# Patient Record
Sex: Female | Born: 1945 | Race: White | Hispanic: No | Smoking: Former smoker
Health system: Southern US, Community
[De-identification: ages and names within clinical notes are randomized; demographics above are authoritative.]

## PROBLEM LIST (undated history)

## (undated) DIAGNOSIS — F3289 Other specified depressive episodes: Secondary | ICD-10-CM

## (undated) DIAGNOSIS — J439 Emphysema, unspecified: Secondary | ICD-10-CM

## (undated) DIAGNOSIS — K589 Irritable bowel syndrome without diarrhea: Secondary | ICD-10-CM

## (undated) DIAGNOSIS — N819 Female genital prolapse, unspecified: Secondary | ICD-10-CM

## (undated) DIAGNOSIS — Z86010 Personal history of colonic polyps: Secondary | ICD-10-CM

## (undated) DIAGNOSIS — I10 Essential (primary) hypertension: Secondary | ICD-10-CM

## (undated) DIAGNOSIS — G4730 Sleep apnea, unspecified: Secondary | ICD-10-CM

## (undated) DIAGNOSIS — C50919 Malignant neoplasm of unspecified site of unspecified female breast: Secondary | ICD-10-CM

## (undated) DIAGNOSIS — G4700 Insomnia, unspecified: Secondary | ICD-10-CM

## (undated) DIAGNOSIS — F419 Anxiety disorder, unspecified: Secondary | ICD-10-CM

## (undated) DIAGNOSIS — Z923 Personal history of irradiation: Secondary | ICD-10-CM

## (undated) DIAGNOSIS — K209 Esophagitis, unspecified: Secondary | ICD-10-CM

## (undated) DIAGNOSIS — K222 Esophageal obstruction: Secondary | ICD-10-CM

## (undated) DIAGNOSIS — K5730 Diverticulosis of large intestine without perforation or abscess without bleeding: Secondary | ICD-10-CM

## (undated) DIAGNOSIS — K648 Other hemorrhoids: Secondary | ICD-10-CM

## (undated) DIAGNOSIS — E7800 Pure hypercholesterolemia, unspecified: Secondary | ICD-10-CM

## (undated) DIAGNOSIS — K219 Gastro-esophageal reflux disease without esophagitis: Secondary | ICD-10-CM

## (undated) DIAGNOSIS — F329 Major depressive disorder, single episode, unspecified: Secondary | ICD-10-CM

## (undated) HISTORY — DX: Irritable bowel syndrome, unspecified: K58.9

## (undated) HISTORY — DX: Malignant neoplasm of unspecified site of unspecified female breast: C50.919

## (undated) HISTORY — DX: Sleep apnea, unspecified: G47.30

## (undated) HISTORY — DX: Pure hypercholesterolemia, unspecified: E78.00

## (undated) HISTORY — DX: Other hemorrhoids: K64.8

## (undated) HISTORY — DX: Major depressive disorder, single episode, unspecified: F32.9

## (undated) HISTORY — DX: Esophagitis, unspecified: K20.9

## (undated) HISTORY — PX: EYE SURGERY: SHX253

## (undated) HISTORY — DX: Other specified depressive episodes: F32.89

## (undated) HISTORY — DX: Essential (primary) hypertension: I10

## (undated) HISTORY — DX: Insomnia, unspecified: G47.00

## (undated) HISTORY — PX: TUBAL LIGATION: SHX77

## (undated) HISTORY — DX: Personal history of colonic polyps: Z86.010

## (undated) HISTORY — DX: Anxiety disorder, unspecified: F41.9

## (undated) HISTORY — PX: NASAL SINUS SURGERY: SHX719

## (undated) HISTORY — DX: Gastro-esophageal reflux disease without esophagitis: K21.9

## (undated) HISTORY — DX: Esophageal obstruction: K22.2

## (undated) HISTORY — DX: Emphysema, unspecified: J43.9

## (undated) HISTORY — PX: ABDOMINAL HYSTERECTOMY: SHX81

## (undated) HISTORY — DX: Diverticulosis of large intestine without perforation or abscess without bleeding: K57.30

---
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
## 1997-09-02 ENCOUNTER — Other Ambulatory Visit: Admission: RE | Admit: 1997-09-02 | Discharge: 1997-09-02 | Payer: Self-pay | Admitting: *Deleted

## 1997-12-07 ENCOUNTER — Other Ambulatory Visit: Admission: RE | Admit: 1997-12-07 | Discharge: 1997-12-07 | Payer: Self-pay | Admitting: Gastroenterology

## 1998-05-10 ENCOUNTER — Ambulatory Visit (HOSPITAL_COMMUNITY): Admission: RE | Admit: 1998-05-10 | Discharge: 1998-05-10 | Payer: Self-pay | Admitting: *Deleted

## 1998-06-06 ENCOUNTER — Ambulatory Visit (HOSPITAL_COMMUNITY): Admission: RE | Admit: 1998-06-06 | Discharge: 1998-06-06 | Payer: Self-pay | Admitting: *Deleted

## 1998-12-19 ENCOUNTER — Other Ambulatory Visit: Admission: RE | Admit: 1998-12-19 | Discharge: 1998-12-19 | Payer: Self-pay | Admitting: *Deleted

## 1999-04-09 DIAGNOSIS — K209 Esophagitis, unspecified without bleeding: Secondary | ICD-10-CM

## 1999-04-09 HISTORY — DX: Esophagitis, unspecified without bleeding: K20.90

## 1999-06-07 ENCOUNTER — Ambulatory Visit (HOSPITAL_COMMUNITY): Admission: RE | Admit: 1999-06-07 | Discharge: 1999-06-07 | Payer: Self-pay | Admitting: *Deleted

## 2000-01-01 ENCOUNTER — Other Ambulatory Visit: Admission: RE | Admit: 2000-01-01 | Discharge: 2000-01-01 | Payer: Self-pay | Admitting: *Deleted

## 2000-01-16 ENCOUNTER — Ambulatory Visit (HOSPITAL_COMMUNITY): Admission: RE | Admit: 2000-01-16 | Discharge: 2000-01-16 | Payer: Self-pay | Admitting: *Deleted

## 2000-06-12 ENCOUNTER — Encounter: Payer: Self-pay | Admitting: Internal Medicine

## 2000-06-12 ENCOUNTER — Ambulatory Visit (HOSPITAL_COMMUNITY): Admission: RE | Admit: 2000-06-12 | Discharge: 2000-06-12 | Payer: Self-pay | Admitting: Internal Medicine

## 2001-03-16 ENCOUNTER — Other Ambulatory Visit: Admission: RE | Admit: 2001-03-16 | Discharge: 2001-03-16 | Payer: Self-pay | Admitting: Internal Medicine

## 2001-04-08 DIAGNOSIS — Z860100 Personal history of colon polyps, unspecified: Secondary | ICD-10-CM

## 2001-04-08 HISTORY — DX: Personal history of colonic polyps: Z86.010

## 2001-04-08 HISTORY — DX: Personal history of colon polyps, unspecified: Z86.0100

## 2001-06-17 ENCOUNTER — Ambulatory Visit (HOSPITAL_COMMUNITY): Admission: RE | Admit: 2001-06-17 | Discharge: 2001-06-17 | Payer: Self-pay | Admitting: Internal Medicine

## 2001-06-17 ENCOUNTER — Encounter: Payer: Self-pay | Admitting: Internal Medicine

## 2002-04-08 HISTORY — PX: CHOLECYSTECTOMY: SHX55

## 2002-07-01 ENCOUNTER — Ambulatory Visit (HOSPITAL_COMMUNITY): Admission: RE | Admit: 2002-07-01 | Discharge: 2002-07-01 | Payer: Self-pay | Admitting: Internal Medicine

## 2002-07-01 ENCOUNTER — Encounter: Payer: Self-pay | Admitting: Internal Medicine

## 2003-04-09 HISTORY — PX: BREAST LUMPECTOMY: SHX2

## 2003-04-13 ENCOUNTER — Other Ambulatory Visit: Admission: RE | Admit: 2003-04-13 | Discharge: 2003-04-13 | Payer: Self-pay | Admitting: Internal Medicine

## 2003-07-04 ENCOUNTER — Ambulatory Visit (HOSPITAL_COMMUNITY): Admission: RE | Admit: 2003-07-04 | Discharge: 2003-07-04 | Payer: Self-pay | Admitting: Internal Medicine

## 2003-07-12 ENCOUNTER — Encounter: Admission: RE | Admit: 2003-07-12 | Discharge: 2003-07-12 | Payer: Self-pay | Admitting: Internal Medicine

## 2004-01-11 ENCOUNTER — Encounter: Admission: RE | Admit: 2004-01-11 | Discharge: 2004-01-11 | Payer: Self-pay | Admitting: Internal Medicine

## 2004-04-08 DIAGNOSIS — Z923 Personal history of irradiation: Secondary | ICD-10-CM

## 2004-04-08 HISTORY — DX: Personal history of irradiation: Z92.3

## 2004-05-14 ENCOUNTER — Other Ambulatory Visit: Admission: RE | Admit: 2004-05-14 | Discharge: 2004-05-14 | Payer: Self-pay | Admitting: Internal Medicine

## 2004-07-03 ENCOUNTER — Ambulatory Visit (HOSPITAL_COMMUNITY): Admission: RE | Admit: 2004-07-03 | Discharge: 2004-07-03 | Payer: Self-pay | Admitting: Internal Medicine

## 2004-07-12 ENCOUNTER — Encounter: Admission: RE | Admit: 2004-07-12 | Discharge: 2004-07-12 | Payer: Self-pay | Admitting: Internal Medicine

## 2004-10-08 ENCOUNTER — Encounter: Admission: RE | Admit: 2004-10-08 | Discharge: 2004-10-08 | Payer: Self-pay | Admitting: General Surgery

## 2005-02-19 ENCOUNTER — Ambulatory Visit (HOSPITAL_COMMUNITY): Admission: RE | Admit: 2005-02-19 | Discharge: 2005-02-19 | Payer: Self-pay | Admitting: Internal Medicine

## 2005-04-08 DIAGNOSIS — K5730 Diverticulosis of large intestine without perforation or abscess without bleeding: Secondary | ICD-10-CM

## 2005-04-08 DIAGNOSIS — K648 Other hemorrhoids: Secondary | ICD-10-CM

## 2005-04-08 HISTORY — DX: Diverticulosis of large intestine without perforation or abscess without bleeding: K57.30

## 2005-04-08 HISTORY — DX: Other hemorrhoids: K64.8

## 2005-05-27 ENCOUNTER — Ambulatory Visit: Payer: Self-pay | Admitting: Gastroenterology

## 2005-05-30 ENCOUNTER — Ambulatory Visit: Payer: Self-pay | Admitting: Gastroenterology

## 2005-06-19 ENCOUNTER — Encounter (INDEPENDENT_AMBULATORY_CARE_PROVIDER_SITE_OTHER): Payer: Self-pay | Admitting: Specialist

## 2005-06-19 ENCOUNTER — Ambulatory Visit: Payer: Self-pay | Admitting: Gastroenterology

## 2005-08-06 ENCOUNTER — Ambulatory Visit (HOSPITAL_COMMUNITY): Admission: RE | Admit: 2005-08-06 | Discharge: 2005-08-06 | Payer: Self-pay | Admitting: Internal Medicine

## 2005-09-06 ENCOUNTER — Encounter: Admission: RE | Admit: 2005-09-06 | Discharge: 2005-09-06 | Payer: Self-pay | Admitting: Internal Medicine

## 2005-10-08 ENCOUNTER — Ambulatory Visit (HOSPITAL_BASED_OUTPATIENT_CLINIC_OR_DEPARTMENT_OTHER): Admission: RE | Admit: 2005-10-08 | Discharge: 2005-10-08 | Payer: Self-pay | Admitting: General Surgery

## 2005-10-08 ENCOUNTER — Encounter (INDEPENDENT_AMBULATORY_CARE_PROVIDER_SITE_OTHER): Payer: Self-pay | Admitting: General Surgery

## 2005-10-08 ENCOUNTER — Encounter (INDEPENDENT_AMBULATORY_CARE_PROVIDER_SITE_OTHER): Payer: Self-pay | Admitting: Specialist

## 2005-10-08 ENCOUNTER — Encounter: Admission: RE | Admit: 2005-10-08 | Discharge: 2005-10-08 | Payer: Self-pay | Admitting: General Surgery

## 2005-10-19 ENCOUNTER — Encounter: Admission: RE | Admit: 2005-10-19 | Discharge: 2005-10-19 | Payer: Self-pay | Admitting: General Surgery

## 2005-10-24 ENCOUNTER — Encounter: Admission: RE | Admit: 2005-10-24 | Discharge: 2005-10-24 | Payer: Self-pay | Admitting: General Surgery

## 2005-10-29 ENCOUNTER — Ambulatory Visit (HOSPITAL_BASED_OUTPATIENT_CLINIC_OR_DEPARTMENT_OTHER): Admission: RE | Admit: 2005-10-29 | Discharge: 2005-10-29 | Payer: Self-pay | Admitting: General Surgery

## 2005-10-29 ENCOUNTER — Encounter (INDEPENDENT_AMBULATORY_CARE_PROVIDER_SITE_OTHER): Payer: Self-pay | Admitting: Specialist

## 2005-11-27 ENCOUNTER — Ambulatory Visit: Payer: Self-pay | Admitting: Oncology

## 2005-11-27 ENCOUNTER — Ambulatory Visit (HOSPITAL_COMMUNITY): Admission: RE | Admit: 2005-11-27 | Discharge: 2005-11-27 | Payer: Self-pay | Admitting: Oncology

## 2005-11-27 LAB — CBC WITH DIFFERENTIAL/PLATELET
BASO%: 0.4 % (ref 0.0–2.0)
EOS%: 2.8 % (ref 0.0–7.0)
HCT: 37 % (ref 34.8–46.6)
LYMPH%: 39.1 % (ref 14.0–48.0)
MCH: 33.3 pg (ref 26.0–34.0)
MCHC: 35.5 g/dL (ref 32.0–36.0)
NEUT%: 48.5 % (ref 39.6–76.8)
RBC: 3.94 10*6/uL (ref 3.70–5.32)
lymph#: 2.4 10*3/uL (ref 0.9–3.3)

## 2005-12-02 ENCOUNTER — Ambulatory Visit: Admission: RE | Admit: 2005-12-02 | Discharge: 2006-02-08 | Payer: Self-pay | Admitting: Radiation Oncology

## 2005-12-10 LAB — COMPREHENSIVE METABOLIC PANEL
ALT: 10 U/L (ref 0–40)
AST: 13 U/L (ref 0–37)
Chloride: 105 mEq/L (ref 96–112)
Creatinine, Ser: 0.65 mg/dL (ref 0.40–1.20)
Sodium: 143 mEq/L (ref 135–145)
Total Bilirubin: 0.3 mg/dL (ref 0.3–1.2)
Total Protein: 6.8 g/dL (ref 6.0–8.3)

## 2005-12-10 LAB — ESTRADIOL, ULTRA SENS: Estradiol, Ultra Sensitive: 27 pg/mL

## 2006-01-13 ENCOUNTER — Ambulatory Visit: Payer: Self-pay | Admitting: Oncology

## 2006-02-26 LAB — COMPREHENSIVE METABOLIC PANEL
ALT: 13 U/L (ref 0–35)
AST: 16 U/L (ref 0–37)
Albumin: 4.6 g/dL (ref 3.5–5.2)
Alkaline Phosphatase: 110 U/L (ref 39–117)
BUN: 10 mg/dL (ref 6–23)
Potassium: 4.2 mEq/L (ref 3.5–5.3)
Sodium: 143 mEq/L (ref 135–145)
Total Protein: 7 g/dL (ref 6.0–8.3)

## 2006-02-26 LAB — CBC WITH DIFFERENTIAL/PLATELET
BASO%: 0.5 % (ref 0.0–2.0)
EOS%: 1.8 % (ref 0.0–7.0)
MCH: 32.9 pg (ref 26.0–34.0)
MCV: 93.6 fL (ref 81.0–101.0)
MONO%: 8.9 % (ref 0.0–13.0)
RBC: 3.85 10*6/uL (ref 3.70–5.32)
RDW: 13 % (ref 11.3–14.5)

## 2006-05-23 ENCOUNTER — Ambulatory Visit: Payer: Self-pay | Admitting: Oncology

## 2006-05-27 ENCOUNTER — Other Ambulatory Visit: Admission: RE | Admit: 2006-05-27 | Discharge: 2006-05-27 | Payer: Self-pay | Admitting: Internal Medicine

## 2006-05-28 LAB — CBC WITH DIFFERENTIAL/PLATELET
Eosinophils Absolute: 0.1 10*3/uL (ref 0.0–0.5)
LYMPH%: 39 % (ref 14.0–48.0)
MONO#: 0.3 10*3/uL (ref 0.1–0.9)
NEUT#: 2.5 10*3/uL (ref 1.5–6.5)
Platelets: 295 10*3/uL (ref 145–400)
RBC: 4.01 10*6/uL (ref 3.70–5.32)
WBC: 4.8 10*3/uL (ref 3.9–10.0)
lymph#: 1.9 10*3/uL (ref 0.9–3.3)

## 2006-05-28 LAB — COMPREHENSIVE METABOLIC PANEL
AST: 14 U/L (ref 0–37)
Alkaline Phosphatase: 104 U/L (ref 39–117)
CO2: 26 mEq/L (ref 19–32)
Chloride: 104 mEq/L (ref 96–112)
Creatinine, Ser: 0.65 mg/dL (ref 0.40–1.20)
Glucose, Bld: 94 mg/dL (ref 70–99)
Potassium: 3.8 mEq/L (ref 3.5–5.3)
Sodium: 140 mEq/L (ref 135–145)

## 2006-07-11 ENCOUNTER — Ambulatory Visit: Payer: Self-pay | Admitting: Oncology

## 2006-08-12 ENCOUNTER — Encounter: Admission: RE | Admit: 2006-08-12 | Discharge: 2006-08-12 | Payer: Self-pay | Admitting: Internal Medicine

## 2006-08-26 ENCOUNTER — Ambulatory Visit: Payer: Self-pay | Admitting: Oncology

## 2006-08-26 LAB — COMPREHENSIVE METABOLIC PANEL
ALT: 9 U/L (ref 0–35)
AST: 14 U/L (ref 0–37)
Albumin: 4.9 g/dL (ref 3.5–5.2)
Alkaline Phosphatase: 106 U/L (ref 39–117)
BUN: 20 mg/dL (ref 6–23)
CO2: 27 mEq/L (ref 19–32)
Calcium: 9.8 mg/dL (ref 8.4–10.5)
Chloride: 105 mEq/L (ref 96–112)
Creatinine, Ser: 0.78 mg/dL (ref 0.40–1.20)
Glucose, Bld: 92 mg/dL (ref 70–99)
Potassium: 3.9 mEq/L (ref 3.5–5.3)
Sodium: 143 mEq/L (ref 135–145)
Total Bilirubin: 0.5 mg/dL (ref 0.3–1.2)
Total Protein: 7.4 g/dL (ref 6.0–8.3)

## 2006-08-26 LAB — CBC WITH DIFFERENTIAL/PLATELET
Basophils Absolute: 0 10*3/uL (ref 0.0–0.1)
Eosinophils Absolute: 0.1 10*3/uL (ref 0.0–0.5)
HCT: 36.1 % (ref 34.8–46.6)
HGB: 13.1 g/dL (ref 11.6–15.9)
LYMPH%: 38.1 % (ref 14.0–48.0)
MONO#: 0.5 10*3/uL (ref 0.1–0.9)
NEUT#: 2.3 10*3/uL (ref 1.5–6.5)
NEUT%: 49.3 % (ref 39.6–76.8)
Platelets: 285 10*3/uL (ref 145–400)
WBC: 4.7 10*3/uL (ref 3.9–10.0)

## 2006-11-18 ENCOUNTER — Ambulatory Visit: Payer: Self-pay | Admitting: Oncology

## 2006-11-20 LAB — CBC WITH DIFFERENTIAL/PLATELET
EOS%: 1.5 % (ref 0.0–7.0)
LYMPH%: 38.1 % (ref 14.0–48.0)
MCH: 33.1 pg (ref 26.0–34.0)
MCHC: 35.8 g/dL (ref 32.0–36.0)
MCV: 92.5 fL (ref 81.0–101.0)
MONO%: 8.1 % (ref 0.0–13.0)
Platelets: 270 10*3/uL (ref 145–400)
RBC: 3.68 10*6/uL — ABNORMAL LOW (ref 3.70–5.32)
RDW: 12.4 % (ref 11.3–14.5)

## 2006-11-20 LAB — COMPREHENSIVE METABOLIC PANEL
AST: 13 U/L (ref 0–37)
Albumin: 4.3 g/dL (ref 3.5–5.2)
Alkaline Phosphatase: 93 U/L (ref 39–117)
BUN: 18 mg/dL (ref 6–23)
Potassium: 3.9 mEq/L (ref 3.5–5.3)
Sodium: 141 mEq/L (ref 135–145)
Total Bilirubin: 0.3 mg/dL (ref 0.3–1.2)
Total Protein: 6.3 g/dL (ref 6.0–8.3)

## 2007-06-08 ENCOUNTER — Ambulatory Visit: Payer: Self-pay | Admitting: Oncology

## 2007-06-10 LAB — CBC WITH DIFFERENTIAL/PLATELET
BASO%: 1.1 % (ref 0.0–2.0)
EOS%: 1.9 % (ref 0.0–7.0)
MCH: 33.6 pg (ref 26.0–34.0)
MCHC: 36.8 g/dL — ABNORMAL HIGH (ref 32.0–36.0)
MCV: 91.4 fL (ref 81.0–101.0)
MONO%: 7.7 % (ref 0.0–13.0)
RBC: 3.8 10*6/uL (ref 3.70–5.32)
RDW: 11.8 % (ref 11.3–14.5)
lymph#: 2 10*3/uL (ref 0.9–3.3)

## 2007-06-11 LAB — COMPREHENSIVE METABOLIC PANEL
ALT: 12 U/L (ref 0–35)
AST: 15 U/L (ref 0–37)
Albumin: 4.7 g/dL (ref 3.5–5.2)
Alkaline Phosphatase: 105 U/L (ref 39–117)
BUN: 13 mg/dL (ref 6–23)
Calcium: 9.6 mg/dL (ref 8.4–10.5)
Chloride: 105 mEq/L (ref 96–112)
Potassium: 3.6 mEq/L (ref 3.5–5.3)

## 2007-06-11 LAB — VITAMIN D 25 HYDROXY (VIT D DEFICIENCY, FRACTURES): Vit D, 25-Hydroxy: 18 ng/mL — ABNORMAL LOW (ref 30–89)

## 2007-07-13 LAB — ESTRADIOL, ULTRA SENS: Estradiol, Ultra Sensitive: <10 pg/mL

## 2007-08-18 ENCOUNTER — Encounter: Admission: RE | Admit: 2007-08-18 | Discharge: 2007-08-18 | Payer: Self-pay | Admitting: Internal Medicine

## 2007-09-03 ENCOUNTER — Ambulatory Visit: Payer: Self-pay | Admitting: Oncology

## 2007-09-23 IMAGING — CR DG CHEST 2V
2 series · 2 of 2 positions shown · non-contrast
Comparison: 10/08/04.

CLINICAL DATA: Carcinoma of the breast.  Question metastatic disease. 
 CHEST - 2 VIEW:

[view not recorded (1 of 2)]
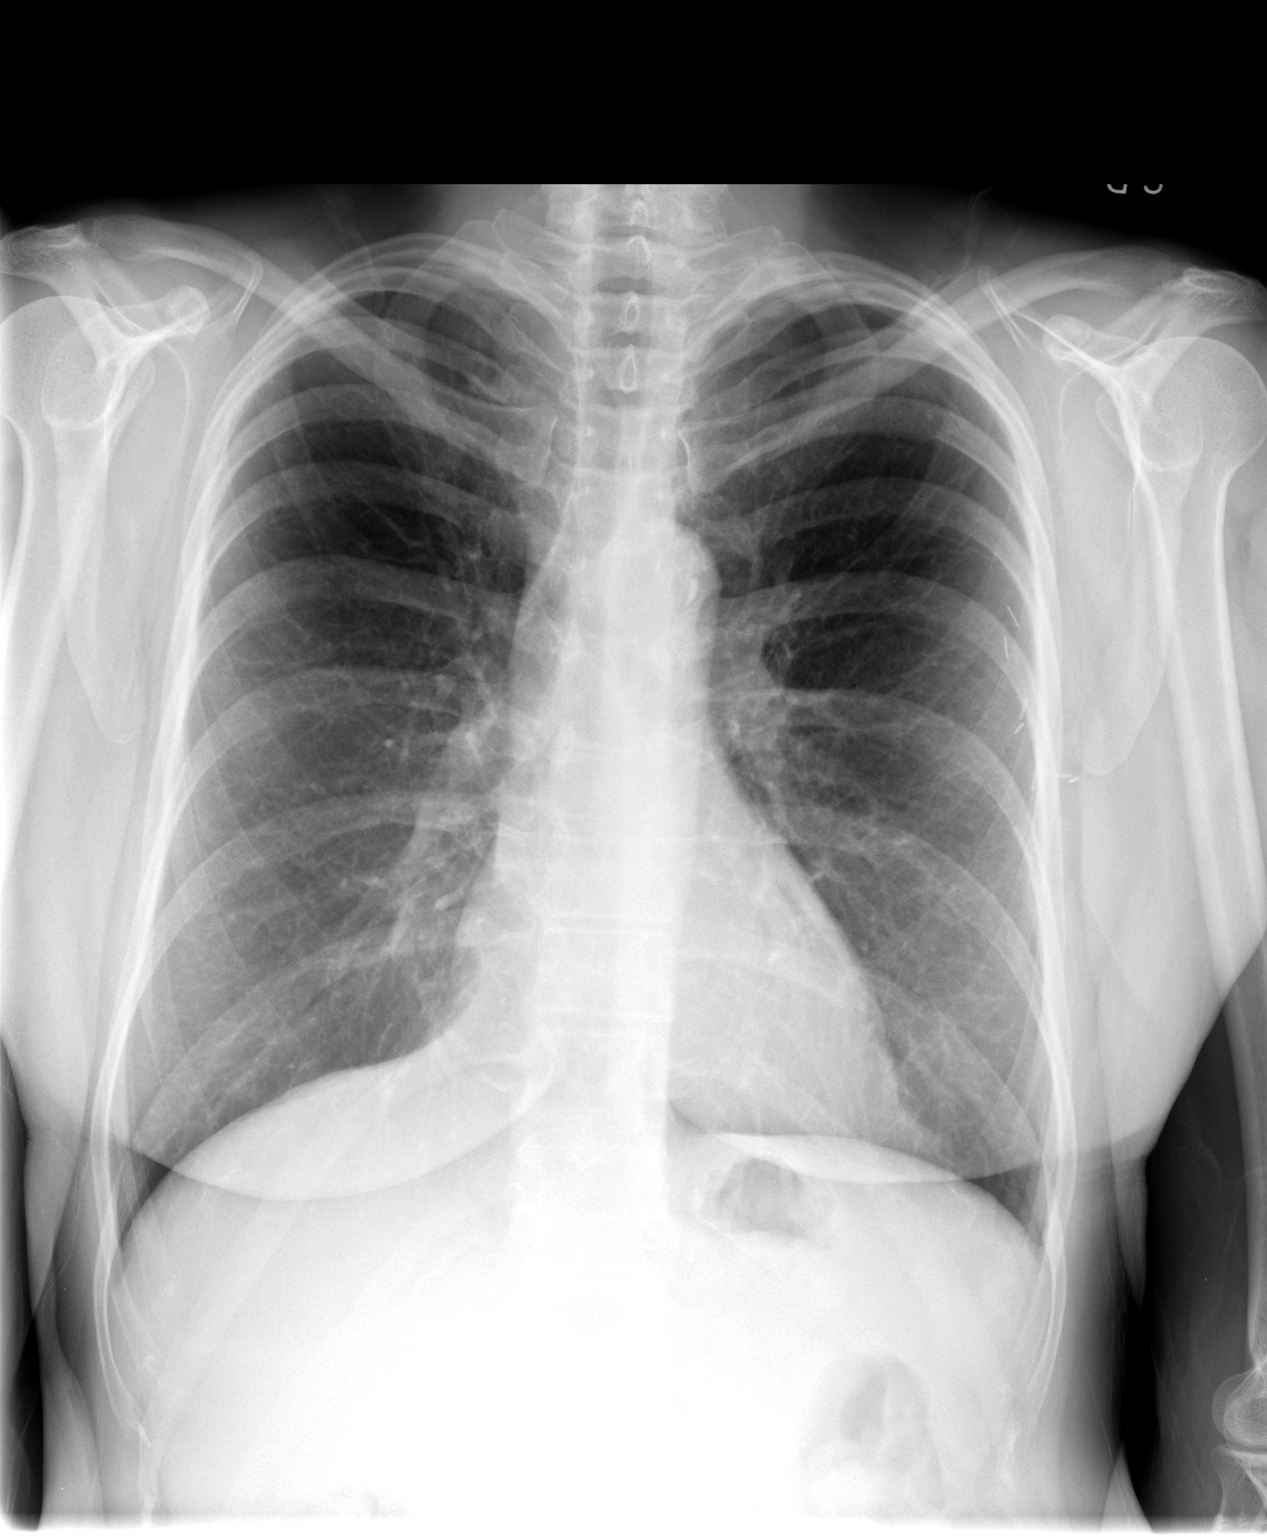

[view not recorded (2 of 2)]
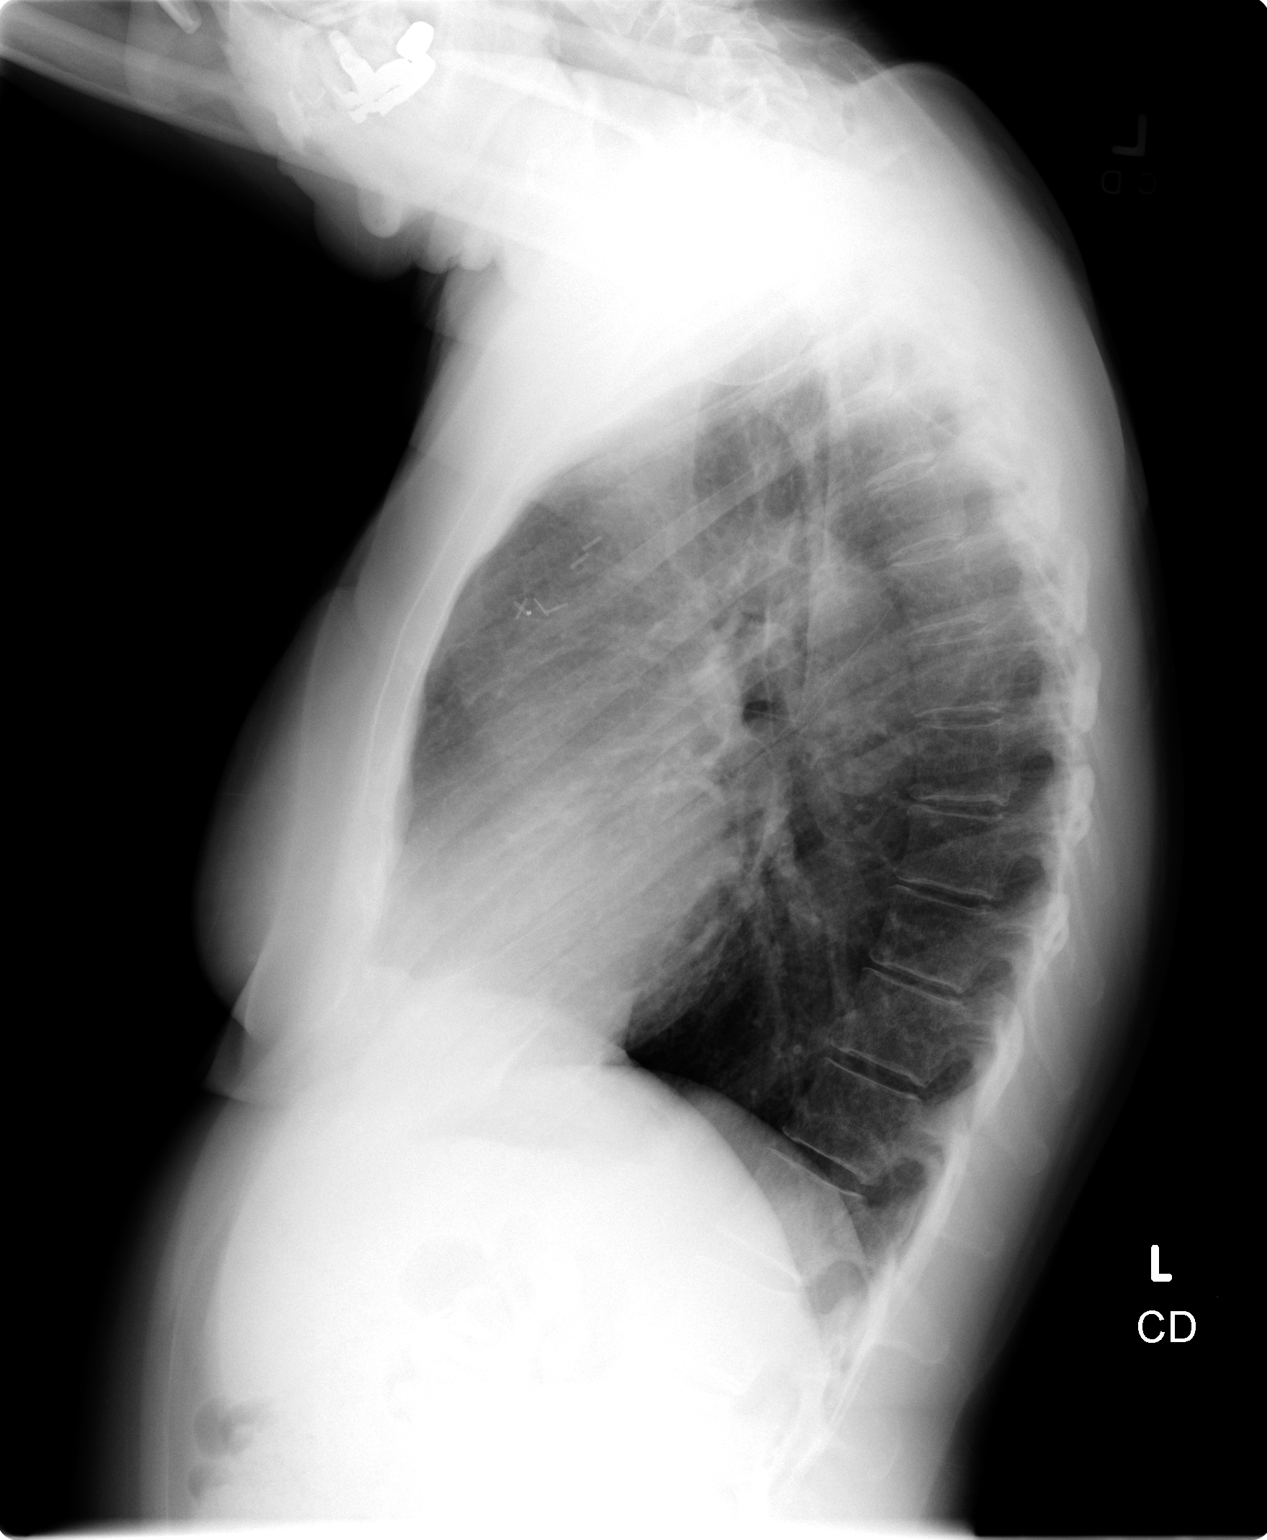

[2 of 2 positions shown; findings below may reference images not displayed]

FINDINGS: There are surgical clips seen within the left axillary region.   The lungs are clear.  The heart and mediastinal structures are normal.
IMPRESSION: No evidence for active chest disease.

## 2007-11-22 ENCOUNTER — Ambulatory Visit: Payer: Self-pay | Admitting: Oncology

## 2007-12-24 ENCOUNTER — Encounter: Payer: Self-pay | Admitting: Gastroenterology

## 2007-12-24 LAB — CBC WITH DIFFERENTIAL/PLATELET
Basophils Absolute: 0 10*3/uL (ref 0.0–0.1)
EOS%: 1.2 % (ref 0.0–7.0)
Eosinophils Absolute: 0.1 10*3/uL (ref 0.0–0.5)
HGB: 14.1 g/dL (ref 11.6–15.9)
MONO#: 0.4 10*3/uL (ref 0.1–0.9)
NEUT#: 2.5 10*3/uL (ref 1.5–6.5)
RDW: 13.3 % (ref 11.3–14.5)
lymph#: 1.6 10*3/uL (ref 0.9–3.3)

## 2007-12-25 LAB — COMPREHENSIVE METABOLIC PANEL
AST: 14 U/L (ref 0–37)
Albumin: 4.7 g/dL (ref 3.5–5.2)
BUN: 11 mg/dL (ref 6–23)
Calcium: 9.8 mg/dL (ref 8.4–10.5)
Chloride: 103 mEq/L (ref 96–112)
Glucose, Bld: 91 mg/dL (ref 70–99)
Potassium: 4 mEq/L (ref 3.5–5.3)

## 2007-12-25 LAB — VITAMIN D 25 HYDROXY (VIT D DEFICIENCY, FRACTURES): Vit D, 25-Hydroxy: 48 ng/mL (ref 30–89)

## 2007-12-25 LAB — CANCER ANTIGEN 27.29: CA 27.29: 40 U/mL — ABNORMAL HIGH (ref 0–39)

## 2008-02-05 ENCOUNTER — Ambulatory Visit: Payer: Self-pay | Admitting: Oncology

## 2008-02-10 LAB — CANCER ANTIGEN 27.29: CA 27.29: 43 U/mL — ABNORMAL HIGH (ref 0–39)

## 2008-03-01 ENCOUNTER — Ambulatory Visit (HOSPITAL_COMMUNITY): Admission: RE | Admit: 2008-03-01 | Discharge: 2008-03-01 | Payer: Self-pay | Admitting: Oncology

## 2008-03-07 LAB — CANCER ANTIGEN 27.29: CA 27.29: 40 U/mL — ABNORMAL HIGH (ref 0–39)

## 2008-04-18 ENCOUNTER — Ambulatory Visit: Payer: Self-pay | Admitting: Oncology

## 2008-04-22 LAB — CBC WITH DIFFERENTIAL/PLATELET
BASO%: 0.5 % (ref 0.0–2.0)
Basophils Absolute: 0 10*3/uL (ref 0.0–0.1)
EOS%: 1.1 % (ref 0.0–7.0)
HCT: 38.5 % (ref 34.8–46.6)
HGB: 13.3 g/dL (ref 11.6–15.9)
LYMPH%: 38.5 % (ref 14.0–48.0)
MCH: 32.9 pg (ref 26.0–34.0)
MCHC: 34.6 g/dL (ref 32.0–36.0)
MCV: 95.1 fL (ref 81.0–101.0)
MONO%: 6.4 % (ref 0.0–13.0)
NEUT%: 53.5 % (ref 39.6–76.8)
lymph#: 2 10*3/uL (ref 0.9–3.3)

## 2008-04-22 LAB — COMPREHENSIVE METABOLIC PANEL
CO2: 26 mEq/L (ref 19–32)
Creatinine, Ser: 0.66 mg/dL (ref 0.40–1.20)
Glucose, Bld: 91 mg/dL (ref 70–99)
Total Bilirubin: 0.4 mg/dL (ref 0.3–1.2)

## 2008-05-19 ENCOUNTER — Encounter: Payer: Self-pay | Admitting: Gastroenterology

## 2008-08-17 ENCOUNTER — Ambulatory Visit: Payer: Self-pay | Admitting: Oncology

## 2008-08-18 ENCOUNTER — Ambulatory Visit (HOSPITAL_COMMUNITY): Admission: RE | Admit: 2008-08-18 | Discharge: 2008-08-18 | Payer: Self-pay | Admitting: Internal Medicine

## 2008-08-18 ENCOUNTER — Encounter: Admission: RE | Admit: 2008-08-18 | Discharge: 2008-08-18 | Payer: Self-pay | Admitting: Oncology

## 2008-09-16 ENCOUNTER — Encounter: Payer: Self-pay | Admitting: Gastroenterology

## 2008-09-16 LAB — CBC WITH DIFFERENTIAL/PLATELET
BASO%: 0.4 % (ref 0.0–2.0)
Eosinophils Absolute: 0.1 10*3/uL (ref 0.0–0.5)
MCHC: 35.6 g/dL (ref 31.5–36.0)
MONO#: 0.3 10*3/uL (ref 0.1–0.9)
MONO%: 6.1 % (ref 0.0–14.0)
NEUT#: 2.8 10*3/uL (ref 1.5–6.5)
RBC: 4.22 10*6/uL (ref 3.70–5.45)
RDW: 12.6 % (ref 11.2–14.5)
WBC: 4.5 10*3/uL (ref 3.9–10.3)

## 2008-09-19 LAB — COMPREHENSIVE METABOLIC PANEL
ALT: 43 U/L — ABNORMAL HIGH (ref 0–35)
Albumin: 4.7 g/dL (ref 3.5–5.2)
Alkaline Phosphatase: 101 U/L (ref 39–117)
CO2: 28 mEq/L (ref 19–32)
Glucose, Bld: 152 mg/dL — ABNORMAL HIGH (ref 70–99)
Potassium: 4 mEq/L (ref 3.5–5.3)
Sodium: 142 mEq/L (ref 135–145)
Total Protein: 7.4 g/dL (ref 6.0–8.3)

## 2008-09-19 LAB — VITAMIN D 25 HYDROXY (VIT D DEFICIENCY, FRACTURES): Vit D, 25-Hydroxy: 45 ng/mL (ref 30–89)

## 2008-12-06 ENCOUNTER — Ambulatory Visit: Payer: Self-pay | Admitting: Oncology

## 2008-12-08 LAB — COMPREHENSIVE METABOLIC PANEL
Albumin: 4.5 g/dL (ref 3.5–5.2)
Alkaline Phosphatase: 94 U/L (ref 39–117)
CO2: 24 mEq/L (ref 19–32)
Glucose, Bld: 95 mg/dL (ref 70–99)
Potassium: 4.4 mEq/L (ref 3.5–5.3)
Sodium: 139 mEq/L (ref 135–145)
Total Protein: 7 g/dL (ref 6.0–8.3)

## 2008-12-08 LAB — CBC WITH DIFFERENTIAL/PLATELET
Eosinophils Absolute: 0.1 10*3/uL (ref 0.0–0.5)
MONO#: 0.4 10*3/uL (ref 0.1–0.9)
MONO%: 7.7 % (ref 0.0–14.0)
NEUT#: 2.9 10*3/uL (ref 1.5–6.5)
RBC: 3.79 10*6/uL (ref 3.70–5.45)
RDW: 12.4 % (ref 11.2–14.5)
WBC: 5 10*3/uL (ref 3.9–10.3)
lymph#: 1.6 10*3/uL (ref 0.9–3.3)

## 2008-12-15 ENCOUNTER — Encounter: Payer: Self-pay | Admitting: Gastroenterology

## 2009-04-17 ENCOUNTER — Ambulatory Visit: Payer: Self-pay | Admitting: Genetic Counselor

## 2009-05-07 LAB — HM DEXA SCAN

## 2009-05-29 ENCOUNTER — Ambulatory Visit: Payer: Self-pay | Admitting: Oncology

## 2009-06-06 LAB — CBC WITH DIFFERENTIAL/PLATELET
Basophils Absolute: 0 10*3/uL (ref 0.0–0.1)
HCT: 37.8 % (ref 34.8–46.6)
HGB: 13.2 g/dL (ref 11.6–15.9)
MCH: 33.1 pg (ref 25.1–34.0)
MONO#: 0.3 10*3/uL (ref 0.1–0.9)
NEUT%: 57.6 % (ref 38.4–76.8)
Platelets: 336 10*3/uL (ref 145–400)
lymph#: 1.8 10*3/uL (ref 0.9–3.3)

## 2009-06-06 LAB — COMPREHENSIVE METABOLIC PANEL
BUN: 13 mg/dL (ref 6–23)
CO2: 27 mEq/L (ref 19–32)
Calcium: 10 mg/dL (ref 8.4–10.5)
Chloride: 104 mEq/L (ref 96–112)
Creatinine, Ser: 0.62 mg/dL (ref 0.40–1.20)

## 2009-06-06 LAB — CANCER ANTIGEN 27.29: CA 27.29: 27 U/mL (ref 0–39)

## 2009-06-06 LAB — VITAMIN D 25 HYDROXY (VIT D DEFICIENCY, FRACTURES): Vit D, 25-Hydroxy: 38 ng/mL (ref 30–89)

## 2009-06-07 ENCOUNTER — Encounter: Payer: Self-pay | Admitting: Gastroenterology

## 2009-07-19 ENCOUNTER — Encounter: Payer: Self-pay | Admitting: Cardiovascular Disease

## 2009-07-20 ENCOUNTER — Other Ambulatory Visit: Admission: RE | Admit: 2009-07-20 | Discharge: 2009-07-20 | Payer: Self-pay | Admitting: Internal Medicine

## 2009-07-20 ENCOUNTER — Encounter: Payer: Self-pay | Admitting: Cardiovascular Disease

## 2009-08-02 ENCOUNTER — Encounter: Payer: Self-pay | Admitting: Cardiovascular Disease

## 2009-08-02 ENCOUNTER — Ambulatory Visit (HOSPITAL_COMMUNITY): Admission: RE | Admit: 2009-08-02 | Discharge: 2009-08-02 | Payer: Self-pay | Admitting: Internal Medicine

## 2009-08-15 DIAGNOSIS — G4700 Insomnia, unspecified: Secondary | ICD-10-CM

## 2009-08-15 DIAGNOSIS — E785 Hyperlipidemia, unspecified: Secondary | ICD-10-CM | POA: Insufficient documentation

## 2009-08-15 DIAGNOSIS — F325 Major depressive disorder, single episode, in full remission: Secondary | ICD-10-CM | POA: Insufficient documentation

## 2009-08-15 DIAGNOSIS — E7800 Pure hypercholesterolemia, unspecified: Secondary | ICD-10-CM

## 2009-08-15 DIAGNOSIS — I10 Essential (primary) hypertension: Secondary | ICD-10-CM

## 2009-08-16 ENCOUNTER — Ambulatory Visit: Payer: Self-pay | Admitting: Cardiovascular Disease

## 2009-08-16 DIAGNOSIS — R002 Palpitations: Secondary | ICD-10-CM | POA: Insufficient documentation

## 2009-08-18 ENCOUNTER — Ambulatory Visit: Payer: Self-pay | Admitting: Oncology

## 2009-08-22 LAB — COMPREHENSIVE METABOLIC PANEL
ALT: 12 U/L (ref 0–35)
Alkaline Phosphatase: 102 U/L (ref 39–117)
CO2: 28 mEq/L (ref 19–32)
Creatinine, Ser: 0.74 mg/dL (ref 0.40–1.20)
Total Bilirubin: 0.8 mg/dL (ref 0.3–1.2)

## 2009-08-22 LAB — CBC WITH DIFFERENTIAL/PLATELET
BASO%: 0.3 % (ref 0.0–2.0)
EOS%: 2.1 % (ref 0.0–7.0)
HCT: 39.5 % (ref 34.8–46.6)
LYMPH%: 36.6 % (ref 14.0–49.7)
MCH: 32.9 pg (ref 25.1–34.0)
MCHC: 34.9 g/dL (ref 31.5–36.0)
MCV: 94.4 fL (ref 79.5–101.0)
MONO#: 0.3 10*3/uL (ref 0.1–0.9)
MONO%: 7.2 % (ref 0.0–14.0)
NEUT%: 53.8 % (ref 38.4–76.8)
Platelets: 275 10*3/uL (ref 145–400)
RBC: 4.19 10*6/uL (ref 3.70–5.45)

## 2009-08-22 LAB — CANCER ANTIGEN 27.29: CA 27.29: 34 U/mL (ref 0–39)

## 2009-08-23 ENCOUNTER — Encounter: Admission: RE | Admit: 2009-08-23 | Discharge: 2009-08-23 | Payer: Self-pay | Admitting: Oncology

## 2009-08-25 ENCOUNTER — Ambulatory Visit (HOSPITAL_COMMUNITY): Admission: RE | Admit: 2009-08-25 | Discharge: 2009-08-25 | Payer: Self-pay | Admitting: Oncology

## 2009-11-28 ENCOUNTER — Ambulatory Visit: Payer: Self-pay | Admitting: Oncology

## 2009-12-04 LAB — CBC WITH DIFFERENTIAL/PLATELET
BASO%: 0.5 % (ref 0.0–2.0)
Eosinophils Absolute: 0.1 10*3/uL (ref 0.0–0.5)
LYMPH%: 45 % (ref 14.0–49.7)
MCHC: 34.3 g/dL (ref 31.5–36.0)
MCV: 95.9 fL (ref 79.5–101.0)
MONO%: 6.4 % (ref 0.0–14.0)
NEUT#: 2.2 10*3/uL (ref 1.5–6.5)
Platelets: 266 10*3/uL (ref 145–400)
RBC: 4.16 10*6/uL (ref 3.70–5.45)
RDW: 12.7 % (ref 11.2–14.5)
WBC: 4.9 10*3/uL (ref 3.9–10.3)

## 2009-12-04 LAB — COMPREHENSIVE METABOLIC PANEL
ALT: 19 U/L (ref 0–35)
AST: 26 U/L (ref 0–37)
Albumin: 4.5 g/dL (ref 3.5–5.2)
Alkaline Phosphatase: 97 U/L (ref 39–117)
Potassium: 4.3 mEq/L (ref 3.5–5.3)
Sodium: 140 mEq/L (ref 135–145)
Total Bilirubin: 0.7 mg/dL (ref 0.3–1.2)
Total Protein: 7.2 g/dL (ref 6.0–8.3)

## 2009-12-07 ENCOUNTER — Encounter: Payer: Self-pay | Admitting: Gastroenterology

## 2010-04-29 ENCOUNTER — Encounter: Payer: Self-pay | Admitting: Oncology

## 2010-05-08 NOTE — Letter (Signed)
Summary: [facility]  [facility]   Imported By: Lennie Odor 02/19/2010 13:39:56  _____________________________________________________________________  External Attachment:    Type:   Image     Comment:   External Document

## 2010-05-08 NOTE — Assessment & Plan Note (Signed)
Summary: np3/ ABN ekg/jml   Primary Provider:  Dr. Elisabeth Most  CC:  abnormal EKG.  History of Present Illness: Samantha [name] is seen today at the request of Dr Elisabeth Most.  She has been under a lot of stress.  Her mother passed, a close friend, died and she lost her job.  She appears to have clinical anxiety and depression.  She had an ECG in her primaries office that showed poor R wave progression.  She smokes infrrequently mostly "bumming" cigaretts from friends when she is out having dinner/drinks.  I counseled her for less than 10 minutes on cessation.  She denies SSCP, dyspnea or syncope.  She does occasionaly get palpitations with anxiety.  She is on a statin for elevated cholesterol.  She has a daughter and two sisters near by for support but lives alone.    Current Problems (verified): 1)  Hypertension  (ICD-401.9) 2)  Hypercholesterolemia  (ICD-272.0) 3)  Insomnia  (ICD-780.52) 4)  Depression  (ICD-311)  Current Medications (verified): 1)  Xanax 0.5 Mg Tabs (Alprazolam) .... 2 Tabs At Bedtime 2)  Fish Oil   Oil (Fish Oil) .Marland Kitchen.. 1 Tab By Mouth Once Daily 3)  Aromasin 25 Mg Tabs (Exemestane) .Marland Kitchen.. 1 Tab By Mouth Once Daily 4)  Celexa 40 Mg Tabs (Citalopram Hydrobromide) .Marland Kitchen.. 1 Tab By Mouth Once Daily 5)  Flax   Oil (Flaxseed (Linseed)) .Marland Kitchen.. 1 Tab  By Mouth Once Daily 6)  Vitamin D 400 Unit Caps (Cholecalciferol) .Marland Kitchen.. 1 Tab By Mouth Once Daily 7)  Pravastatin Sodium 40 Mg Tabs (Pravastatin Sodium) .... Take One Tablet By Mouth Daily At Bedtime  Allergies (verified): No Known Drug Allergies  Past History:  Past Medical History: Last updated: 08/14/2009 Current Problems:  Abnormal ECG:  poor R wave progression R/O anterior MI HYPERTENSION (ICD-401.9) HYPERCHOLESTEROLEMIA (ICD-272.0) INSOMNIA (ICD-780.52) DEPRESSION (ICD-311) Smoker  Past Surgical History: Last updated: 08/14/2009 Left axillary sentinel node biopsy, left lumpectomy site re-excision.  Family History: Last updated:  08/14/2009 Father: MI Mother:DVT, RA, MI, biploar  Social History: Last updated: 08/16/2009 Tobacco Use - Yes.  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   Vital Signs:  Patient profile:   65 year old female Height:      61 inches Weight:      110 pounds BMI:     20.86 Pulse rate:   82 / minute Resp:     12 per minute BP sitting:   109 / 69  (left arm)  Vitals Entered By: Kem Parkinson (Aug 16, 2009 2:40 PM)  Physical Exam  General:  Affect appropriate Healthy:  appears stated age HEENT: normal Neck supple with no adenopathy JVP normal no bruits no thyromegaly Lungs clear with no wheezing and good diaphragmatic motion Heart:  S1/S2 no murmur,rub, gallop or click PMI normal Abdomen: benighn, BS positve, no tenderness, no AAA no bruit.  No HSM or HJR Distal pulses intact with no bruits No edema Neuro non-focal Skin warm and dry    Impression & Recommendations:  Problem # 1:  PALPITATIONS (ICD-785.1) Benign related to anxiety Orders: EKG w/ Interpretation (93000) Echocardiogram (Echo)  Problem # 2:  HYPERCHOLESTEROLEMIA (ICD-272.0) Continue statin labs per Dr Elisabeth Most Her updated medication list for this problem includes:    Pravastatin Sodium 40 Mg Tabs (Pravastatin sodium) .Marland Kitchen... Take one tablet by mouth daily at bedtime  Problem # 3:  ELECTROCARDIOGRAM, ABNORMAL (ICD-794.31) Repeated in our office.  ECG normal R wave progressoin.  Leads likely placed too high in primaries office.  Will get echo to make sure but likely ECG changes from lead placement  Patient  Instructions: 1)  Your physician recommends that you schedule a follow-up appointment in: after ECHO 2)  Your physician recommends that you continue on your current medications as directed. Please refer to the Current Medication list given to you today. 3)  Your physician has requested that you have an echocardiogram.  Echocardiography is a painless test that uses sound waves to create images of your heart. It provides your doctor with information about the size and shape of your heart and how well your heart's chambers and valves are working.  This procedure takes approximately one hour. There are no restrictions for this procedure.   EKG Report  Procedure date:  08/16/2009  Findings:      NSR 72 PR 110 no preexcitattion RAD Normal R wave progresson

## 2010-05-08 NOTE — Letter (Signed)
[facility]   Imported By: Sherian Rein 06/30/2009 08:23:59  _____________________________________________________________________  External Attachment:    Type:   Image     Comment:   External Document

## 2010-05-08 NOTE — Consult Note (Signed)
Summary: [facility]  [facility]   Imported By: Marylou Mccoy 08/15/2009 12:26:48  _____________________________________________________________________  External Attachment:    Type:   Image     Comment:   External Document

## 2010-07-16 ENCOUNTER — Encounter: Payer: Self-pay | Admitting: Gastroenterology

## 2010-07-26 ENCOUNTER — Other Ambulatory Visit: Payer: Self-pay | Admitting: Oncology

## 2010-07-26 DIAGNOSIS — Z9889 Other specified postprocedural states: Secondary | ICD-10-CM

## 2010-08-24 NOTE — Op Note (Signed)
[name], [name]               ACCOUNT NO.:  [id number]   MEDICAL RECORD NO.:  [id number]          PATIENT TYPE:  AMB   LOCATION:  DSC                          FACILITY:  [facility]   PHYSICIAN:  Gita Kudo, M.D. DATE OF BIRTH:  November 14, 1945   DATE OF PROCEDURE:  10/29/2005  DATE OF DISCHARGE:                                 OPERATIVE REPORT   OPERATIVE PROCEDURE:  Left axillary sentinel node biopsy, left lumpectomy  site re-excision.   SURGEON:  Gita Kudo, M.D.   ANESTHESIA:  General.   PREOPERATIVE DIAGNOSIS:  Cancer of the left breast.   POSTOPERATIVE DIAGNOSIS:  Cancer of the left breast.   CLINICAL SUMMARY:  [name] is a 65 year old female brought back to  surgery for re-excision of a lumpectomy site and left axillary sentinel node  biopsy.   The patient underwent a left lumpectomy-partial mastectomy with needle  guided biopsy for abnormal mammogram showing a radial scar.  This was done  on October 08, 2005.  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  The margins were clean but very close at 1 mm.  She  comes in after having had MRI and repeat ultrasound of her axilla showing no  definite pathology.   OPERATIVE PROCEDURE:  The patient received preoperative radionuclide  injection and was positioned in the standard fashion.  A total of 5 mL of  dilute methylene blue was placed beneath the areola and then she was prepped  and draped.  The NeoProbe was used to outline the hot area in the axilla and  a transverse incision made down to the pectoralis muscle which was retracted  medially.  Then, careful dissection following blue lymphatics and the  NeoProbe yielded a very hot area and I found a blue node that was quite hot  and removed it.  There were a few smaller nodes nearby and I also removed  them as a separate specimen.  The axilla was then re-scanned and the counts  were basically negative.  Accordingly, the wound was  infiltrated with  Marcaine and a moistened gauze placed in it and the lymph node sent to  pathology.   While waiting, a keyhole type incision was made encompassing the previous  incision and extending out laterally and inferiorly, to the areas where the  margins were close.  Cautery was then used to remove breast tissue including  the previous biopsy cavity down to pectoralis muscle and out beyond the  previous procedure.  This was marked with a suture for pathology and sent.  The wound was then infiltrated with Marcaine, made hemostatic, lavaged with  saline.   At this time, pathology called back that the sentinel node was negative.  We  did not require any further pathologic studies.  The wounds were closed in  layers with 3-0 Vicryl and staples for the skin.  A sterile absorbent  dressing was applied and she went to the recovery room from the operating  room good condition.           ______________________________  Casimiro Needle  Fortino Sic, M.D.     MRL/MEDQ  D:  10/29/2005  T:  10/29/2005  Job:  [id number]   cc:   Lucky Cowboy, M.D.  Fax: [phone]   Lovenia Kim, D.O.  Fax: [phone]

## 2010-08-27 ENCOUNTER — Ambulatory Visit
Admission: RE | Admit: 2010-08-27 | Discharge: 2010-08-27 | Disposition: A | Discharge status: Home / Self Care | Payer: 59 | Source: Ambulatory Visit | Attending: Oncology | Admitting: Oncology

## 2010-08-27 DIAGNOSIS — Z9889 Other specified postprocedural states: Secondary | ICD-10-CM

## 2010-08-28 ENCOUNTER — Encounter: Payer: Self-pay | Admitting: Gastroenterology

## 2010-10-05 ENCOUNTER — Encounter: Payer: Self-pay | Admitting: Gastroenterology

## 2010-10-09 ENCOUNTER — Ambulatory Visit: Payer: 59 | Admitting: Gastroenterology

## 2010-10-30 ENCOUNTER — Ambulatory Visit (HOSPITAL_COMMUNITY)
Admission: RE | Admit: 2010-10-30 | Discharge: 2010-10-30 | Disposition: A | Discharge status: Home / Self Care | Payer: 59 | Source: Ambulatory Visit | Attending: Internal Medicine | Admitting: Internal Medicine

## 2010-10-30 ENCOUNTER — Other Ambulatory Visit (HOSPITAL_COMMUNITY): Payer: Self-pay | Admitting: Internal Medicine

## 2010-10-30 DIAGNOSIS — F17200 Nicotine dependence, unspecified, uncomplicated: Secondary | ICD-10-CM

## 2010-10-30 DIAGNOSIS — Z0000 Encounter for general adult medical examination without abnormal findings: Secondary | ICD-10-CM | POA: Insufficient documentation

## 2010-11-09 ENCOUNTER — Ambulatory Visit: Payer: 59 | Admitting: Gastroenterology

## 2010-11-09 ENCOUNTER — Encounter: Payer: Self-pay | Admitting: Gastroenterology

## 2010-12-18 ENCOUNTER — Ambulatory Visit: Payer: 59 | Admitting: Gastroenterology

## 2010-12-20 ENCOUNTER — Other Ambulatory Visit: Payer: Self-pay | Admitting: Oncology

## 2010-12-20 ENCOUNTER — Encounter (HOSPITAL_BASED_OUTPATIENT_CLINIC_OR_DEPARTMENT_OTHER): Payer: 59 | Admitting: Oncology

## 2010-12-20 DIAGNOSIS — C50919 Malignant neoplasm of unspecified site of unspecified female breast: Secondary | ICD-10-CM

## 2010-12-20 DIAGNOSIS — Z170 Estrogen receptor positive status [ER+]: Secondary | ICD-10-CM

## 2010-12-20 LAB — CBC WITH DIFFERENTIAL/PLATELET
Eosinophils Absolute: 0.1 10*3/uL (ref 0.0–0.5)
HCT: 39.1 % (ref 34.8–46.6)
LYMPH%: 34.9 % (ref 14.0–49.7)
MCV: 94.9 fL (ref 79.5–101.0)
MONO#: 0.5 10*3/uL (ref 0.1–0.9)
MONO%: 9.6 % (ref 0.0–14.0)
NEUT#: 2.6 10*3/uL (ref 1.5–6.5)
NEUT%: 52.2 % (ref 38.4–76.8)
Platelets: 280 10*3/uL (ref 145–400)
RBC: 4.12 10*6/uL (ref 3.70–5.45)
WBC: 4.9 10*3/uL (ref 3.9–10.3)

## 2010-12-21 LAB — COMPREHENSIVE METABOLIC PANEL
AST: 24 U/L (ref 0–37)
Albumin: 4.8 g/dL (ref 3.5–5.2)
Alkaline Phosphatase: 97 U/L (ref 39–117)
BUN: 15 mg/dL (ref 6–23)
Potassium: 4.5 mEq/L (ref 3.5–5.3)

## 2011-01-02 ENCOUNTER — Encounter (HOSPITAL_BASED_OUTPATIENT_CLINIC_OR_DEPARTMENT_OTHER): Payer: 59 | Admitting: Oncology

## 2011-01-02 DIAGNOSIS — C50919 Malignant neoplasm of unspecified site of unspecified female breast: Secondary | ICD-10-CM

## 2011-01-02 DIAGNOSIS — Z170 Estrogen receptor positive status [ER+]: Secondary | ICD-10-CM

## 2011-01-31 ENCOUNTER — Ambulatory Visit (INDEPENDENT_AMBULATORY_CARE_PROVIDER_SITE_OTHER): Payer: 59 | Admitting: Gastroenterology

## 2011-01-31 ENCOUNTER — Encounter: Payer: Self-pay | Admitting: Gastroenterology

## 2011-01-31 VITALS — BP 122/64 | HR 88 | Ht 61.0 in | Wt 114.0 lb

## 2011-01-31 DIAGNOSIS — K219 Gastro-esophageal reflux disease without esophagitis: Secondary | ICD-10-CM

## 2011-01-31 MED ORDER — OMEPRAZOLE-SODIUM BICARBONATE 40-1100 MG PO CAPS: 1.0000 | ORAL_CAPSULE | Freq: Every day | ORAL | Status: AC

## 2011-01-31 NOTE — Patient Instructions (Signed)
We are sending in a new prescription to your pharmacy

## 2011-01-31 NOTE — Assessment & Plan Note (Signed)
Symptoms are compatible with gastroesophageal reflux.  Recommendations #1 begin Zegerid 40 mg every morning #2 anti-reflex measures

## 2011-01-31 NOTE — Progress Notes (Signed)
Samantha [name] is a 65 year old white female referred at the request of Dr. Elisabeth Most for evaluation of throat and chest discomfort. She has a history of an esophageal stricture which was dilated several years ago. She's complaining of burning discomfort that radiates from her abdomen to her chest. She has a sensation of difficulty swallowing her saliva. She denies dysphagia to solids.  She denies hoarseness or sore throat. She is on no gastric irritants including nonsteroidals.      Past Medical History  Diagnosis Date  . Unspecified essential hypertension   . Pure hypercholesterolemia   . Insomnia, unspecified   . Depressive disorder, not elsewhere classified   . Breast cancer     left   . Anxiety   . GERD (gastroesophageal reflux disease)   . IBS (irritable bowel syndrome)   . Hx of colonic polyps 2003    Colonoscopy   . Esophagitis, unspecified 2001    EGD  . Stricture and stenosis of esophagus 2001, 2007    EGD  . Diverticulosis of colon (without mention of hemorrhage) 2007    Colonoscopy  . Internal hemorrhoids without mention of complication 2007    Colonoscopy    Past Surgical History  Procedure Date  . Abdominal hysterectomy   . Cholecystectomy   . Breast lumpectomy     left    reports that she has been smoking.  She has never used smokeless tobacco. She reports that she does not drink alcohol or use illicit drugs. family history includes Bipolar disorder in her mother and Breast cancer in her mother.  There is no history of Colon cancer.  Current medications and social history were reviewed in [employer] electronic medical record  Review of Systems: She complains of anxiety and fatigue Pertinent positive and negative review of systems were noted in the above HPI section. All other review of systems were otherwise negative.  Vital signs were reviewed in today's medical record Physical Exam: General: Well developed , well nourished, no acute distress Head:  Normocephalic and atraumatic Eyes:  sclerae anicteric, EOMI Ears: Normal auditory acuity Mouth: No deformity or lesions Neck: Supple, no masses or thyromegaly Lungs: Clear throughout to auscultation Heart: Regular rate and rhythm; no murmurs, rubs or bruits Abdomen: Soft, non tender and non distended. No masses, hepatosplenomegaly or hernias noted. Normal Bowel sounds Rectal:deferred Musculoskeletal: Symmetrical with no gross deformities  Skin: No lesions on visible extremities Pulses:  Normal pulses noted Extremities: No clubbing, cyanosis, edema or deformities noted Neurological: Alert oriented x 4, grossly nonfocal Cervical Nodes:  No significant cervical adenopathy Inguinal Nodes: No significant inguinal adenopathy Psychological:  Alert and cooperative. Normal mood and affect

## 2011-08-27 ENCOUNTER — Other Ambulatory Visit: Payer: Self-pay | Admitting: Internal Medicine

## 2011-08-27 DIAGNOSIS — Z9889 Other specified postprocedural states: Secondary | ICD-10-CM

## 2011-08-27 DIAGNOSIS — Z853 Personal history of malignant neoplasm of breast: Secondary | ICD-10-CM

## 2011-09-05 ENCOUNTER — Ambulatory Visit (HOSPITAL_COMMUNITY)
Admission: RE | Admit: 2011-09-05 | Discharge: 2011-09-05 | Disposition: A | Discharge status: Home / Self Care | Payer: 59 | Source: Ambulatory Visit | Attending: Internal Medicine | Admitting: Internal Medicine

## 2011-09-05 ENCOUNTER — Other Ambulatory Visit (HOSPITAL_COMMUNITY): Payer: Self-pay | Admitting: Internal Medicine

## 2011-09-05 ENCOUNTER — Ambulatory Visit
Admission: RE | Admit: 2011-09-05 | Discharge: 2011-09-05 | Disposition: A | Discharge status: Home / Self Care | Payer: 59 | Source: Ambulatory Visit | Attending: Internal Medicine | Admitting: Internal Medicine

## 2011-09-05 DIAGNOSIS — I10 Essential (primary) hypertension: Secondary | ICD-10-CM | POA: Insufficient documentation

## 2011-09-05 DIAGNOSIS — F17200 Nicotine dependence, unspecified, uncomplicated: Secondary | ICD-10-CM | POA: Insufficient documentation

## 2011-09-05 DIAGNOSIS — Z853 Personal history of malignant neoplasm of breast: Secondary | ICD-10-CM

## 2011-09-05 DIAGNOSIS — Z9889 Other specified postprocedural states: Secondary | ICD-10-CM

## 2012-05-07 ENCOUNTER — Encounter: Payer: Self-pay | Admitting: Gastroenterology

## 2012-05-11 ENCOUNTER — Encounter: Payer: Self-pay | Admitting: Gastroenterology

## 2012-07-03 ENCOUNTER — Encounter: Payer: 59 | Admitting: Gastroenterology

## 2012-07-31 ENCOUNTER — Encounter: Payer: Self-pay | Admitting: Gastroenterology

## 2012-08-06 ENCOUNTER — Encounter: Payer: 59 | Admitting: Gastroenterology

## 2012-08-11 ENCOUNTER — Ambulatory Visit (AMBULATORY_SURGERY_CENTER): Payer: Medicare Other | Admitting: *Deleted

## 2012-08-11 VITALS — Ht 61.5 in | Wt 111.6 lb

## 2012-08-11 DIAGNOSIS — Z1211 Encounter for screening for malignant neoplasm of colon: Secondary | ICD-10-CM

## 2012-08-11 MED ORDER — NA SULFATE-K SULFATE-MG SULF 17.5-3.13-1.6 GM/177ML PO SOLN: ORAL | Status: DC

## 2012-08-12 ENCOUNTER — Encounter: Payer: Self-pay | Admitting: Gastroenterology

## 2012-08-20 ENCOUNTER — Encounter: Payer: Self-pay | Admitting: Gastroenterology

## 2012-08-20 ENCOUNTER — Ambulatory Visit: Payer: Medicare Other | Admitting: Gastroenterology

## 2012-08-20 VITALS — BP 114/63 | HR 51 | Temp 97.0°F | Resp 41 | Ht 61.0 in | Wt 111.0 lb

## 2012-08-20 DIAGNOSIS — Z1211 Encounter for screening for malignant neoplasm of colon: Secondary | ICD-10-CM

## 2012-08-20 MED ORDER — SODIUM CHLORIDE 0.9 % IV SOLN: 500.0000 mL | INTRAVENOUS | Status: DC

## 2012-08-20 NOTE — Patient Instructions (Addendum)
YOU HAD AN ENDOSCOPIC PROCEDURE TODAY AT [facility]: Refer to the procedure report that was given to you for any specific questions about what was found during the examination.  If the procedure report does not answer your questions, please call your gastroenterologist to clarify.  If you requested that your care partner not be given the details of your procedure findings, then the procedure report has been included in a sealed envelope for you to review at your convenience later.  YOU SHOULD EXPECT: Some feelings of bloating in the abdomen. Passage of more gas than usual.  Walking can help get rid of the air that was put into your GI tract during the procedure and reduce the bloating. If you had a lower endoscopy (such as a colonoscopy or flexible sigmoidoscopy) you may notice spotting of blood in your stool or on the toilet paper. If you underwent a bowel prep for your procedure, then you may not have a normal bowel movement for a few days.  DIET: Your first meal following the procedure should be a light meal and then it is ok to progress to your normal diet.  A half-sandwich or bowl of soup is an example of a good first meal.  Heavy or fried foods are harder to digest and may make you feel nauseous or bloated.  Likewise meals heavy in dairy and vegetables can cause extra gas to form and this can also increase the bloating.  Drink plenty of fluids but you should avoid alcoholic beverages for 24 hours.  ACTIVITY: Your care partner should take you home directly after the procedure.  You should plan to take it easy, moving slowly for the rest of the day.  You can resume normal activity the day after the procedure however you should NOT DRIVE or use heavy machinery for 24 hours (because of the sedation medicines used during the test).    SYMPTOMS TO REPORT IMMEDIATELY: A gastroenterologist can be reached at any hour.  During normal business hours, 8:30 AM to 5:00 PM Monday through Friday,  call [phone].  After hours and on weekends, please call the GI answering service at [phone] who will take a message and have the physician on call contact you.   Following lower endoscopy (colonoscopy or flexible sigmoidoscopy):  Excessive amounts of blood in the stool  Significant tenderness or worsening of abdominal pains  Swelling of the abdomen that is new, acute  Fever of 100F or higher s  FOLLOW UP: If any biopsies were taken you will be contacted by phone or by letter within the next 1-3 weeks.  Call your gastroenterologist if you have not heard about the biopsies in 3 weeks.  Our staff will call the home number listed on your records the next business day following your procedure to check on you and address any questions or concerns that you may have at that time regarding the information given to you following your procedure. This is a courtesy call and so if there is no answer at the home number and we have not heard from you through the emergency physician on call, we will assume that you have returned to your regular daily activities without incident.  SIGNATURES/CONFIDENTIALITY: You and/or your care partner have signed paperwork which will be entered into your electronic medical record.  These signatures attest to the fact that that the information above on your After Visit Summary has been reviewed and is understood.  Full responsibility of the confidentiality of  this discharge information lies with you and/or your care-partner.  Diverticulosis and high fiber diet information given.  Repeat in 10 years.

## 2012-08-20 NOTE — Op Note (Signed)
[facility] [address] [place], [address]   COLONOSCOPY PROCEDURE REPORT  PATIENT: Samantha [name], Samantha [name]  MR#: [id number] BIRTHDATE: Jan 19, 1946 , 66  yrs. old GENDER: Female ENDOSCOPIST: Louis Meckel, MD REFERRED [id number] Oneta Rack, M.D. PROCEDURE DATE:  08/20/2012 PROCEDURE:   Colonoscopy, diagnostic ASA CLASS:   Class II INDICATIONS:Patient's personal history of adenomatous colon polyps; index polypectomy 2003; 2007 colo negative for polyps. MEDICATIONS: MAC sedation, administered by CRNA and propofol (Diprivan) 200mg  IV  DESCRIPTION OF PROCEDURE:   After the risks benefits and alternatives of the procedure were thoroughly explained, informed consent was obtained.  A digital rectal exam revealed no abnormalities of the rectum.   The LB CF-[id number]  endoscope was introduced through the anus and advanced to the cecum, which was identified by the ileocecal valve. No adverse events experienced.   The quality of the prep was excellent using Suprep The instrument was then slowly withdrawn as the colon was fully examined.      COLON FINDINGS: Mild diverticulosis was noted in the sigmoid colon. The colon mucosa was otherwise normal.  Retroflexed views revealed no abnormalities. The time to cecum=6 minutes 50 seconds. Withdrawal time=6 minutes 01 seconds.  The scope was withdrawn and the procedure completed. COMPLICATIONS: There were no complications.  ENDOSCOPIC IMPRESSION: 1.   Mild diverticulosis was noted in the sigmoid colon 2.   The colon mucosa was otherwise normal  RECOMMENDATIONS: Colonoscopy 10 years   eSigned:  Louis Meckel, MD 08/20/2012 8:35 AM   cc:

## 2012-08-20 NOTE — Progress Notes (Signed)
Patient did not experience any of the following events: a burn prior to discharge; a fall within the facility; wrong site/side/patient/procedure/implant event; or a hospital transfer or hospital admission upon discharge from the facility. (G8907) Patient did not have preoperative order for IV antibiotic SSI prophylaxis. (G8918)  

## 2012-08-21 ENCOUNTER — Telehealth: Payer: Self-pay | Admitting: *Deleted

## 2012-08-21 NOTE — Telephone Encounter (Signed)
Left message on number given in admitting to return call if problems or concerns. ewm

## 2012-09-01 ENCOUNTER — Other Ambulatory Visit (HOSPITAL_COMMUNITY): Payer: Self-pay | Admitting: Internal Medicine

## 2012-09-01 DIAGNOSIS — Z1231 Encounter for screening mammogram for malignant neoplasm of breast: Secondary | ICD-10-CM

## 2012-09-01 DIAGNOSIS — Z780 Asymptomatic menopausal state: Secondary | ICD-10-CM

## 2012-09-15 ENCOUNTER — Ambulatory Visit (HOSPITAL_COMMUNITY): Payer: Medicare Other

## 2012-10-02 ENCOUNTER — Inpatient Hospital Stay (HOSPITAL_COMMUNITY): Admission: RE | Admit: 2012-10-02 | Payer: Medicare Other | Source: Ambulatory Visit

## 2012-10-02 ENCOUNTER — Ambulatory Visit (HOSPITAL_COMMUNITY): Payer: Medicare Other

## 2012-10-08 ENCOUNTER — Ambulatory Visit (HOSPITAL_COMMUNITY)
Admission: RE | Admit: 2012-10-08 | Discharge: 2012-10-08 | Disposition: A | Discharge status: Home / Self Care | Payer: Medicare Other | Source: Ambulatory Visit | Attending: Internal Medicine | Admitting: Internal Medicine

## 2012-10-08 DIAGNOSIS — Z780 Asymptomatic menopausal state: Secondary | ICD-10-CM

## 2012-10-08 DIAGNOSIS — Z1231 Encounter for screening mammogram for malignant neoplasm of breast: Secondary | ICD-10-CM | POA: Insufficient documentation

## 2012-10-08 DIAGNOSIS — Z13820 Encounter for screening for osteoporosis: Secondary | ICD-10-CM | POA: Insufficient documentation

## 2012-12-30 ENCOUNTER — Other Ambulatory Visit (HOSPITAL_COMMUNITY): Payer: Self-pay | Admitting: Physician Assistant

## 2012-12-30 ENCOUNTER — Ambulatory Visit (HOSPITAL_COMMUNITY)
Admission: RE | Admit: 2012-12-30 | Discharge: 2012-12-30 | Disposition: A | Discharge status: Home / Self Care | Payer: Medicare Other | Source: Ambulatory Visit | Attending: Physician Assistant | Admitting: Physician Assistant

## 2012-12-30 DIAGNOSIS — S43499A Other sprain of unspecified shoulder joint, initial encounter: Secondary | ICD-10-CM

## 2012-12-30 DIAGNOSIS — Q762 Congenital spondylolisthesis: Secondary | ICD-10-CM | POA: Insufficient documentation

## 2012-12-30 DIAGNOSIS — M5030 Other cervical disc degeneration, unspecified cervical region: Secondary | ICD-10-CM | POA: Insufficient documentation

## 2012-12-30 DIAGNOSIS — M542 Cervicalgia: Secondary | ICD-10-CM | POA: Insufficient documentation

## 2012-12-30 DIAGNOSIS — M25519 Pain in unspecified shoulder: Secondary | ICD-10-CM | POA: Insufficient documentation

## 2013-01-11 ENCOUNTER — Other Ambulatory Visit (HOSPITAL_COMMUNITY): Payer: Self-pay | Admitting: Internal Medicine

## 2013-01-11 ENCOUNTER — Ambulatory Visit (HOSPITAL_COMMUNITY)
Admission: RE | Admit: 2013-01-11 | Discharge: 2013-01-11 | Disposition: A | Discharge status: Home / Self Care | Payer: Medicare Other | Source: Ambulatory Visit | Attending: Internal Medicine | Admitting: Internal Medicine

## 2013-01-11 DIAGNOSIS — I700 Atherosclerosis of aorta: Secondary | ICD-10-CM | POA: Insufficient documentation

## 2013-01-11 DIAGNOSIS — I10 Essential (primary) hypertension: Secondary | ICD-10-CM

## 2013-01-11 DIAGNOSIS — F17200 Nicotine dependence, unspecified, uncomplicated: Secondary | ICD-10-CM | POA: Insufficient documentation

## 2013-02-19 ENCOUNTER — Other Ambulatory Visit: Payer: Self-pay | Admitting: Internal Medicine

## 2013-02-19 MED ORDER — BUPROPION HCL ER (XL) 150 MG PO TB24: 150.0000 mg | ORAL_TABLET | ORAL | Status: DC

## 2013-03-07 ENCOUNTER — Encounter: Payer: Self-pay | Admitting: Physician Assistant

## 2013-03-10 ENCOUNTER — Ambulatory Visit: Payer: Self-pay | Admitting: Physician Assistant

## 2013-04-20 ENCOUNTER — Other Ambulatory Visit: Payer: Self-pay | Admitting: Physician Assistant

## 2013-04-28 NOTE — Telephone Encounter (Signed)
SCHEDULED PT FOR 04-30-13

## 2013-04-30 ENCOUNTER — Ambulatory Visit: Payer: Self-pay | Admitting: Physician Assistant

## 2013-05-07 ENCOUNTER — Encounter: Payer: Self-pay | Admitting: Physician Assistant

## 2013-05-07 ENCOUNTER — Ambulatory Visit (INDEPENDENT_AMBULATORY_CARE_PROVIDER_SITE_OTHER): Payer: Medicare Other | Admitting: Physician Assistant

## 2013-05-07 VITALS — BP 110/62 | HR 80 | Temp 98.1°F | Resp 16 | Ht 61.0 in | Wt 113.0 lb

## 2013-05-07 DIAGNOSIS — E782 Mixed hyperlipidemia: Secondary | ICD-10-CM

## 2013-05-07 DIAGNOSIS — E7800 Pure hypercholesterolemia, unspecified: Secondary | ICD-10-CM

## 2013-05-07 DIAGNOSIS — I10 Essential (primary) hypertension: Secondary | ICD-10-CM

## 2013-05-07 DIAGNOSIS — Z79899 Other long term (current) drug therapy: Secondary | ICD-10-CM

## 2013-05-07 DIAGNOSIS — E039 Hypothyroidism, unspecified: Secondary | ICD-10-CM

## 2013-05-07 LAB — CBC WITH DIFFERENTIAL/PLATELET
BASOS ABS: 0 10*3/uL (ref 0.0–0.1)
BASOS PCT: 1 % (ref 0–1)
EOS ABS: 0.1 10*3/uL (ref 0.0–0.7)
Eosinophils Relative: 3 % (ref 0–5)
HCT: 38 % (ref 36.0–46.0)
HEMOGLOBIN: 13.1 g/dL (ref 12.0–15.0)
Lymphocytes Relative: 34 % (ref 12–46)
Lymphs Abs: 1.5 10*3/uL (ref 0.7–4.0)
MCH: 32 pg (ref 26.0–34.0)
MCHC: 34.5 g/dL (ref 30.0–36.0)
MCV: 92.9 fL (ref 78.0–100.0)
MONOS PCT: 10 % (ref 3–12)
Monocytes Absolute: 0.5 10*3/uL (ref 0.1–1.0)
NEUTROS PCT: 52 % (ref 43–77)
Neutro Abs: 2.3 10*3/uL (ref 1.7–7.7)
Platelets: 263 10*3/uL (ref 150–400)
RBC: 4.09 MIL/uL (ref 3.87–5.11)
RDW: 13.2 % (ref 11.5–15.5)
WBC: 4.4 10*3/uL (ref 4.0–10.5)

## 2013-05-07 LAB — LIPID PANEL
CHOLESTEROL: 159 mg/dL (ref 0–200)
HDL: 38 mg/dL — ABNORMAL LOW (ref >39)
LDL Cholesterol: 83 mg/dL (ref 0–99)
TRIGLYCERIDES: 192 mg/dL — AB (ref <150)
Total CHOL/HDL Ratio: 4.2 Ratio
VLDL: 38 mg/dL (ref 0–40)

## 2013-05-07 LAB — BASIC METABOLIC PANEL WITHOUT GFR
BUN: 15 mg/dL (ref 6–23)
CO2: 31 meq/L (ref 19–32)
Calcium: 9.4 mg/dL (ref 8.4–10.5)
Chloride: 102 meq/L (ref 96–112)
Creat: 0.68 mg/dL (ref 0.50–1.10)
GFR, Est African American: >89 mL/min
GFR, Est Non African American: >89 mL/min
Glucose, Bld: 88 mg/dL (ref 70–99)
Potassium: 3.8 meq/L (ref 3.5–5.3)
Sodium: 141 meq/L (ref 135–145)

## 2013-05-07 LAB — HEPATIC FUNCTION PANEL
ALT: 21 U/L (ref 0–35)
AST: 18 U/L (ref 0–37)
Albumin: 4.5 g/dL (ref 3.5–5.2)
Alkaline Phosphatase: 90 U/L (ref 39–117)
Bilirubin, Direct: 0.1 mg/dL (ref 0.0–0.3)
Indirect Bilirubin: 0.3 mg/dL (ref 0.2–1.2)
TOTAL PROTEIN: 6.6 g/dL (ref 6.0–8.3)
Total Bilirubin: 0.4 mg/dL (ref 0.2–1.2)

## 2013-05-07 LAB — TSH: TSH: 4.168 u[IU]/mL (ref 0.350–4.500)

## 2013-05-07 LAB — MAGNESIUM: Magnesium: 2.2 mg/dL (ref 1.5–2.5)

## 2013-05-07 NOTE — Patient Instructions (Signed)

## 2013-05-07 NOTE — Progress Notes (Addendum)
HPI Patient presents for 3 month follow up with hypertension, hyperlipidemia, and vitamin D. Patient's blood pressure has been controlled at home, today their BP is BP: 110/62 mmHg  Patient denies chest pain, shortness of breath, dizziness.  Patient's cholesterol is diet controlled.  The cholesterol last visit was LDL 114, trigs 170, HDL 52.  CKD last GFR 75.  Patient is on Vitamin D supplement.   She was on chantix to help stop smoking, she is off it now and has not smoked since June. She has smoked occ with going out with her friends.  She was having right shoulder/neck pain- she has had an injection and doing Pt. She has joined silver sneakers.  Current Medications:  Current Outpatient Prescriptions on File Prior to Visit  Medication Sig Dispense Refill  . ALPRAZolam (XANAX) 0.5 MG tablet Take 0.5 mg by mouth. 2 at bed time       . buPROPion (WELLBUTRIN XL) 150 MG 24 hr tablet Take 1 tablet (150 mg total) by mouth every morning.  30 tablet  2  . Cholecalciferol (VITAMIN D) 400 UNITS capsule Take 400 Units by mouth daily.        . Citalopram Hydrobromide (CELEXA PO) Take by mouth daily.      . fish oil-omega-3 fatty acids 1000 MG capsule Take 2 g by mouth daily.        . Flax OIL Take by mouth.        Marland Kitchen NIACIN CR PO Take by mouth daily.       No current facility-administered medications on file prior to visit.   Medical History:  Past Medical History  Diagnosis Date  . Unspecified essential hypertension   . Pure hypercholesterolemia   . Insomnia, unspecified   . Depressive disorder, not elsewhere classified   . Breast cancer     left   . Anxiety   . GERD (gastroesophageal reflux disease)   . IBS (irritable bowel syndrome)   . Hx of colonic polyps 2003    Colonoscopy   . Esophagitis, unspecified 2001    EGD  . Stricture and stenosis of esophagus 2001, 2007    EGD  . Diverticulosis of colon (without mention of hemorrhage) 2007    Colonoscopy  . Internal hemorrhoids without  mention of complication 5621    Colonoscopy    Allergies:  Allergies  Allergen Reactions  . Codeine   . Decongestant [Pseudoephedrine Hcl]     ROS Constitutional: Denies fever, chills, headaches, insomnia, fatigue, night sweats Eyes: Denies redness, blurred vision, diplopia, discharge, itchy, watery eyes.  ENT: Denies congestion, post nasal drip, sore throat, earache, dental pain, Tinnitus, Vertigo, Sinus pain, snoring.  Cardio: Denies chest pain, palpitations, irregular heartbeat, dyspnea, diaphoresis, orthopnea, PND, claudication, edema Respiratory: denies cough, shortness of breath, wheezing.  Gastrointestinal: Denies dysphagia, heartburn, AB pain/ cramps, N/V, diarrhea, constipation, hematemesis, melena, hematochezia,  hemorrhoids Genitourinary: Denies dysuria, frequency, urgency, nocturia, hesitancy, discharge, hematuria, flank pain Musculoskeletal: Denies myalgia, stiffness, pain, swelling and strain/sprain. Skin: Denies pruritis, rash, changing in skin lesion Neuro: Denies Weakness, tremor, incoordination, spasms, pain Psychiatric: Denies confusion, memory loss, sensory loss Endocrine: Denies change in weight, skin, hair change, nocturia Diabetic Polys, Denies visual blurring, hyper /hypo glycemic episodes, and paresthesia, Heme/Lymph: Denies Excessive bleeding, bruising, enlarged lymph nodes  Family history- Review and unchanged Social history- Review and unchanged Physical Exam: Filed Vitals:   05/07/13 0920  BP: 110/62  Pulse: 80  Temp: 98.1 F (36.7 C)  Resp: 16  Filed Weights   05/07/13 0920  Weight: 113 lb (51.256 kg)   General Appearance: Well nourished, in no apparent distress. Eyes: PERRLA, EOMs, conjunctiva no swelling or erythema Sinuses: No Frontal/maxillary tenderness ENT/Mouth: Ext aud canals clear, TMs without erythema, bulging. No erythema, swelling, or exudate on post pharynx.  Tonsils not swollen or erythematous. Hearing normal.  Neck: Supple,  thyroid normal.  Respiratory: Respiratory effort normal, BS equal bilaterally without rales, rhonchi, wheezing or stridor.  Cardio: RRR with no MRGs. Brisk peripheral pulses without edema.  Abdomen: Soft, + BS.  Non tender, no guarding, rebound, hernias, masses. Lymphatics: Non tender without lymphadenopathy.  Musculoskeletal: Full ROM, 5/5 strength, normal gait.  Skin: Warm, dry without rashes, lesions, ecchymosis.  Neuro: Cranial nerves intact. Normal muscle tone, no cerebellar symptoms. Sensation intact.  Psych: Awake and oriented X 3, normal affect, Insight and Judgment appropriate.   Assessment and Plan:  Hypertension: Continue medication, monitor blood pressure at home.  Continue DASH diet. Cholesterol: Continue diet and exercise. Check cholesterol.  Vitamin D Def- check level and continue medications.  Anxiety.depression Continue diet and meds as discussed. Further disposition pending results of labs.   Patient's thyroid is close to hypothyroid range. We will start her on Levothyroxine 49mcg and recheck her TSH in one month.  Vicie Mutters 9:33 AM

## 2013-05-08 MED ORDER — LEVOTHYROXINE SODIUM 50 MCG PO TABS: 50.0000 ug | ORAL_TABLET | Freq: Every day | ORAL | Status: DC

## 2013-05-08 NOTE — Addendum Note (Signed)
Addended by: Vicie Mutters R on: 05/08/2013 10:36 AM   Modules accepted: Orders

## 2013-05-08 NOTE — Addendum Note (Signed)
Addended by: Vicie Mutters R on: 05/08/2013 10:34 AM   Modules accepted: Orders

## 2013-06-08 ENCOUNTER — Other Ambulatory Visit: Payer: Self-pay

## 2013-06-14 ENCOUNTER — Other Ambulatory Visit: Payer: Self-pay | Admitting: Internal Medicine

## 2013-06-14 ENCOUNTER — Other Ambulatory Visit: Payer: Self-pay | Admitting: Physician Assistant

## 2013-06-21 ENCOUNTER — Ambulatory Visit (INDEPENDENT_AMBULATORY_CARE_PROVIDER_SITE_OTHER): Payer: Medicare Other | Admitting: Physician Assistant

## 2013-06-21 ENCOUNTER — Encounter: Payer: Self-pay | Admitting: Physician Assistant

## 2013-06-21 VITALS — BP 100/60 | HR 80 | Temp 97.7°F | Resp 16 | Ht 61.0 in | Wt 114.0 lb

## 2013-06-21 DIAGNOSIS — I10 Essential (primary) hypertension: Secondary | ICD-10-CM

## 2013-06-21 DIAGNOSIS — F329 Major depressive disorder, single episode, unspecified: Secondary | ICD-10-CM

## 2013-06-21 DIAGNOSIS — F3289 Other specified depressive episodes: Secondary | ICD-10-CM

## 2013-06-21 DIAGNOSIS — E7800 Pure hypercholesterolemia, unspecified: Secondary | ICD-10-CM

## 2013-06-21 DIAGNOSIS — E559 Vitamin D deficiency, unspecified: Secondary | ICD-10-CM

## 2013-06-21 DIAGNOSIS — N3000 Acute cystitis without hematuria: Secondary | ICD-10-CM

## 2013-06-21 DIAGNOSIS — Z0000 Encounter for general adult medical examination without abnormal findings: Secondary | ICD-10-CM

## 2013-06-21 DIAGNOSIS — Z79899 Other long term (current) drug therapy: Secondary | ICD-10-CM

## 2013-06-21 LAB — CBC WITH DIFFERENTIAL/PLATELET
Basophils Absolute: 0 10*3/uL (ref 0.0–0.1)
Basophils Relative: 0 % (ref 0–1)
Eosinophils Absolute: 0.1 10*3/uL (ref 0.0–0.7)
Eosinophils Relative: 2 % (ref 0–5)
HCT: 37.7 % (ref 36.0–46.0)
Hemoglobin: 13.1 g/dL (ref 12.0–15.0)
LYMPHS PCT: 29 % (ref 12–46)
Lymphs Abs: 1.7 10*3/uL (ref 0.7–4.0)
MCH: 31.6 pg (ref 26.0–34.0)
MCHC: 34.7 g/dL (ref 30.0–36.0)
MCV: 90.8 fL (ref 78.0–100.0)
Monocytes Absolute: 0.5 10*3/uL (ref 0.1–1.0)
Monocytes Relative: 9 % (ref 3–12)
NEUTROS PCT: 60 % (ref 43–77)
Neutro Abs: 3.5 10*3/uL (ref 1.7–7.7)
PLATELETS: 306 10*3/uL (ref 150–400)
RBC: 4.15 MIL/uL (ref 3.87–5.11)
RDW: 13.2 % (ref 11.5–15.5)
WBC: 5.8 10*3/uL (ref 4.0–10.5)

## 2013-06-21 LAB — MAGNESIUM: MAGNESIUM: 2.1 mg/dL (ref 1.5–2.5)

## 2013-06-21 LAB — BASIC METABOLIC PANEL WITH GFR
BUN: 12 mg/dL (ref 6–23)
CHLORIDE: 102 meq/L (ref 96–112)
CO2: 30 mEq/L (ref 19–32)
Calcium: 10.2 mg/dL (ref 8.4–10.5)
Creat: 0.73 mg/dL (ref 0.50–1.10)
GFR, EST NON AFRICAN AMERICAN: 86 mL/min
GFR, Est African American: >89 mL/min
Glucose, Bld: 97 mg/dL (ref 70–99)
Potassium: 3.9 mEq/L (ref 3.5–5.3)
SODIUM: 141 meq/L (ref 135–145)

## 2013-06-21 LAB — LIPID PANEL
CHOL/HDL RATIO: 4.1 ratio
CHOLESTEROL: 213 mg/dL — AB (ref 0–200)
HDL: 52 mg/dL (ref >39)
LDL Cholesterol: 121 mg/dL — ABNORMAL HIGH (ref 0–99)
Triglycerides: 201 mg/dL — ABNORMAL HIGH (ref <150)
VLDL: 40 mg/dL (ref 0–40)

## 2013-06-21 LAB — HEPATIC FUNCTION PANEL
ALK PHOS: 100 U/L (ref 39–117)
ALT: 16 U/L (ref 0–35)
AST: 17 U/L (ref 0–37)
Albumin: 4.6 g/dL (ref 3.5–5.2)
BILIRUBIN TOTAL: 0.5 mg/dL (ref 0.2–1.2)
Bilirubin, Direct: 0.1 mg/dL (ref 0.0–0.3)
Indirect Bilirubin: 0.4 mg/dL (ref 0.2–1.2)
Total Protein: 7.2 g/dL (ref 6.0–8.3)

## 2013-06-21 LAB — TSH: TSH: 3.647 u[IU]/mL (ref 0.350–4.500)

## 2013-06-21 NOTE — Patient Instructions (Signed)

## 2013-06-21 NOTE — Progress Notes (Signed)
Subjective:   Samantha [name] is a 68 y.o. female who presents for Medicare Annual Wellness Visit and 3 month follow up on hypertension,  hyperlipidemia, vitamin D def.  Date of last medicare wellness visit is unknown.   Her blood pressure has been controlled at home, today their BP is BP: 100/60 mmHg She does workout. She denies chest pain, shortness of breath, dizziness.  She is on cholesterol medication and denies myalgias. Her cholesterol is at goal. The cholesterol last visit was:   Lab Results  Component Value Date   CHOL 159 05/07/2013   HDL 38* 05/07/2013   LDLCALC 83 05/07/2013   TRIG 192* 05/07/2013   CHOLHDL 4.2 05/07/2013   Last A1C in the office was: 5.1 Patient is on Vitamin D supplement. She is not on thyroid medication.  She started on armour thyroid per a request for a friend.   Lab Results  Component Value Date   TSH 4.168 05/07/2013  Depression doing well on Celexa.  Has had a bit of dysuria for 1-2 days, denies frequency, urgency, foul smell, incontinence.   Names of Other Physician/Practitioners you currently use: 1. [facility]- here for primary care 2. [facility] , eye doctor, last visit 04/2013 3.  Dr. Chestine Spore, dentist, last visit today 4. Dr. Arlyce Dice 5. Dr. Annabell Howells 6. Dr. Danella Deis 7. Dr. Dayna Barker Patient Care Team: Lucky Cowboy, MD as PCP - General (Internal Medicine)  Medication Review Current Outpatient Prescriptions on File Prior to Visit  Medication Sig Dispense Refill  . ALPRAZolam (XANAX) 0.5 MG tablet TAKE 1 TO 3 TABLETS BY MOUTH EVERYDAY AS NEEDED  90 tablet  0  . Cholecalciferol (VITAMIN D) 400 UNITS capsule Take 400 Units by mouth daily.        . Citalopram Hydrobromide (CELEXA PO) Take by mouth daily.      . fish oil-omega-3 fatty acids 1000 MG capsule Take 2 g by mouth daily.        . Flax OIL Take by mouth.        Marland Kitchen NIACIN CR PO Take by mouth daily.      . pravastatin (PRAVACHOL) 40 MG tablet TAKE 1  TABLET BY MOUTH EVERY EVENING  30 tablet  0   No current facility-administered medications on file prior to visit.    Current Problems (verified) Patient Active Problem List   Diagnosis Date Noted  . Esophageal reflux 01/31/2011  . PALPITATIONS 08/16/2009  . ELECTROCARDIOGRAM, ABNORMAL 08/16/2009  . HYPERCHOLESTEROLEMIA 08/15/2009  . DEPRESSION 08/15/2009  . HYPERTENSION 08/15/2009  . INSOMNIA 08/15/2009    Screening Tests Health Maintenance  Topic Date Due  . Tetanus/tdap  10/22/1965  . Zostavax  10/23/2006  . Pneumococcal Polysaccharide Vaccine Age 63 And Over  10/23/2011  . Influenza Vaccine  11/06/2012  . Mammogram  10/09/2014  . Colonoscopy  11/05/2022    Preventative care: Last colonoscopy: 10/2012 Last mammogram: 10/2012 Last pap smear/pelvic exam: 2011   DEXA: 10/2012- osteopenia  Prior vaccinations: TD or Tdap: 1999 and declines  Influenza: declines Pneumococcal: 2000 Shingles/Zostavax: declines  History reviewed: allergies, current medications, past family history, past medical history, past social history, past surgical history and problem list  Risk Factors: Osteoporosis: postmenopausal estrogen deficiency and amenorrhea History of fracture in the past year: no  Tobacco History  Substance Use Topics  . Smoking status: Former Smoker -- 1.00 packs/day for 35 years    Quit date: 03/28/2012  . Smokeless tobacco: Never Used  Comment: Counseling sheet given in exam room   . Alcohol Use: 0.6 oz/week    1 Glasses of wine per week   She does not smoke.  Patient is a former smoker. Are there smokers in your home (other than you)?  No  Alcohol Current alcohol use: social drinker  Caffeine Current caffeine use: coffee 1-2 /day  Exercise Exercise limitations: The patient has no exercise limitations. Current exercise: housecleaning, walking and yard work  Nutrition/Diet Current diet: in general, a "healthy" diet    Cardiac risk factors:  advanced age (older than 1 for men, 65 for women), dyslipidemia, family history of premature cardiovascular disease and history of tobacco use.  Depression Screen Nurse depression screen reviewed.  (Note: if answer to either of the following is "Yes", a more complete depression screening is indicated)   Q1: Over the past two weeks, have you felt down, depressed or hopeless? No  Q2: Over the past two weeks, have you felt little interest or pleasure in doing things? No  Have you lost interest or pleasure in daily life? No  Do you often feel hopeless? No  Do you cry easily over simple problems? No  Activities of Daily Living Nurse ADLs screen reviewed.  In your present state of health, do you have any difficulty performing the following activities?:  Driving? No Managing money?  No Feeding yourself? No Getting from bed to chair? No Climbing a flight of stairs? No Preparing food and eating?: No Bathing or showering? No Getting dressed: No Getting to the toilet? No Using the toilet:No Moving around from place to place: No In the past year have you fallen or had a near fall?:No   Are you sexually active?  No  Do you have more than one partner?  No  Vision Difficulties: No  Hearing Difficulties: No Do you often ask people to speak up or repeat themselves? No Do you experience ringing or noises in your ears? No Do you have difficulty understanding soft or whispered voices? No  Cognition  Do you feel that you have a problem with memory?No  Do you often misplace items? No  Do you feel safe at home?  Yes  Advanced directives Does patient have a [place]? Yes Does patient have a Living Will? Yes   Objective:     Vision and hearing screens reviewed.   Blood pressure 100/60, pulse 80, temperature 97.7 F (36.5 C), resp. rate 16, height 5\' 1"  (1.549 m), weight 114 lb (51.71 kg). Body mass index is 21.55 kg/(m^2).  General appearance: alert, no  distress, WD/WN,  female Cognitive Testing  Alert? Yes  Normal Appearance?Yes  Oriented to person? Yes  Place? Yes   Time? Yes  Recall of three objects?  Yes  Can perform simple calculations? Yes  Displays appropriate judgment?Yes  Can read the correct time from a watch face?Yes  HEENT: normocephalic, sclerae anicteric, TMs pearly, nares patent, no discharge or erythema, pharynx normal Oral cavity: MMM, no lesions Neck: supple, no lymphadenopathy, no thyromegaly, no masses Heart: RRR, normal S1, S2, no murmurs Lungs: CTA bilaterally, no wheezes, rhonchi, or rales Abdomen: +bs, soft, non tender, non distended, no masses, no hepatomegaly, no splenomegaly Musculoskeletal: nontender, no swelling, no obvious deformity Extremities: no edema, no cyanosis, no clubbing Pulses: 2+ symmetric, upper and lower extremities, normal cap refill Neurological: alert, oriented x 3, CN2-12 intact, strength normal upper extremities and lower extremities, sensation normal throughout, DTRs 2+ throughout, no cerebellar signs, gait normal  Psychiatric: normal affect, behavior normal, pleasant  Breast: defer Gyn: defer   Rectal: defer   Assessment:   1. HYPERTENSION - CBC with Differential - BASIC METABOLIC PANEL WITH GFR - Hepatic function panel  2. HYPERCHOLESTEROLEMIA - Lipid panel  3. DEPRESSION Continue celexa, screening shows in remission, cont to monitor  4. Unspecified vitamin D deficiency - Vit D  25 hydroxy (rtn osteoporosis monitoring)  5. Encounter for long-term (current) use of other medications - Magnesium  6. Acute cystitis - Urinalysis, Routine w reflex microscopic - Urine culture If negative needs pelvic exam  7. Hypothyroid -check TSH leve.    Plan:   During the course of the visit the patient was educated and counseled about appropriate screening and preventive services including:    Pneumococcal vaccine   Influenza vaccine  Td vaccine  Screening  electrocardiogram  Screening mammography  Bone densitometry screening  Colorectal cancer screening  Diabetes screening  Glaucoma screening  Nutrition counseling   Screening recommendations, referrals:  Vaccinations: Tdap vaccine not done  Influenza vaccine not done Pneumococcal vaccine not done Shingles vaccine not done Hep B vaccine not done  Nutrition assessed and recommended  Colonoscopy yes Mammogram yes Pap smear not done Pelvic exam not done Recommended yearly ophthalmology/optometry visit for glaucoma screening and checkup Recommended yearly dental visit for hygiene and checkup Advanced directives - not done  Conditions/risks identified: BMI: Discussed weight loss, diet, and increase physical activity.  Increase physical activity: AHA recommends 150 minutes of physical activity a week.  Medications reviewed DEXA- requested Urinary Incontinence is not an issue: discussed non pharmacology and pharmacology options.  Fall risk: low- discussed PT, home fall assessment, medications.   Medicare Attestation I have personally reviewed: The patient's medical and social history Their use of alcohol, tobacco or illicit drugs Their current medications and supplements The patient's functional ability including ADLs,fall risks, home safety risks, cognitive, and hearing and visual impairment Diet and physical activities Evidence for depression or mood disorders  The patient's weight, height, BMI, and visual acuity have been recorded in the chart.  I have made referrals, counseling, and provided education to the patient based on review of the above and I have provided the patient with a written personalized care plan for preventive services.     Vicie Mutters, PA-C   06/21/2013

## 2013-06-22 ENCOUNTER — Other Ambulatory Visit: Payer: Self-pay

## 2013-06-22 LAB — URINALYSIS, MICROSCOPIC ONLY
BACTERIA UA: NONE SEEN
CASTS: NONE SEEN
SQUAMOUS EPITHELIAL / LPF: NONE SEEN

## 2013-06-22 LAB — URINALYSIS, ROUTINE W REFLEX MICROSCOPIC
Glucose, UA: NEGATIVE mg/dL
HGB URINE DIPSTICK: NEGATIVE
NITRITE: NEGATIVE
Protein, ur: NEGATIVE mg/dL
Specific Gravity, Urine: 1.026 (ref 1.005–1.030)
UROBILINOGEN UA: 0.2 mg/dL (ref 0.0–1.0)
pH: 5 (ref 5.0–8.0)

## 2013-06-22 LAB — URINE CULTURE: Colony Count: 40000

## 2013-06-22 LAB — VITAMIN D 25 HYDROXY (VIT D DEFICIENCY, FRACTURES): Vit D, 25-Hydroxy: 66 ng/mL (ref 30–89)

## 2013-07-30 ENCOUNTER — Other Ambulatory Visit: Payer: Self-pay | Admitting: Physician Assistant

## 2013-08-03 ENCOUNTER — Other Ambulatory Visit: Payer: Self-pay | Admitting: Physician Assistant

## 2013-08-31 ENCOUNTER — Ambulatory Visit (INDEPENDENT_AMBULATORY_CARE_PROVIDER_SITE_OTHER): Payer: Medicare Other | Admitting: Physician Assistant

## 2013-08-31 ENCOUNTER — Ambulatory Visit (HOSPITAL_COMMUNITY)
Admission: RE | Admit: 2013-08-31 | Discharge: 2013-08-31 | Disposition: A | Discharge status: Home / Self Care | Payer: Medicare Other | Source: Ambulatory Visit | Attending: Physician Assistant | Admitting: Physician Assistant

## 2013-08-31 ENCOUNTER — Encounter: Payer: Self-pay | Admitting: Physician Assistant

## 2013-08-31 VITALS — BP 124/68 | HR 80 | Temp 97.9°F | Resp 16 | Ht 61.0 in | Wt 112.6 lb

## 2013-08-31 DIAGNOSIS — Z79899 Other long term (current) drug therapy: Secondary | ICD-10-CM

## 2013-08-31 DIAGNOSIS — E559 Vitamin D deficiency, unspecified: Secondary | ICD-10-CM

## 2013-08-31 DIAGNOSIS — K219 Gastro-esophageal reflux disease without esophagitis: Secondary | ICD-10-CM

## 2013-08-31 DIAGNOSIS — E7800 Pure hypercholesterolemia, unspecified: Secondary | ICD-10-CM

## 2013-08-31 DIAGNOSIS — F329 Major depressive disorder, single episode, unspecified: Secondary | ICD-10-CM

## 2013-08-31 DIAGNOSIS — Z789 Other specified health status: Secondary | ICD-10-CM

## 2013-08-31 DIAGNOSIS — Z0000 Encounter for general adult medical examination without abnormal findings: Secondary | ICD-10-CM

## 2013-08-31 DIAGNOSIS — I10 Essential (primary) hypertension: Secondary | ICD-10-CM

## 2013-08-31 DIAGNOSIS — F3289 Other specified depressive episodes: Secondary | ICD-10-CM

## 2013-08-31 DIAGNOSIS — Z87891 Personal history of nicotine dependence: Secondary | ICD-10-CM | POA: Insufficient documentation

## 2013-08-31 LAB — CBC WITH DIFFERENTIAL/PLATELET
BASOS ABS: 0 10*3/uL (ref 0.0–0.1)
Basophils Relative: 0 % (ref 0–1)
EOS ABS: 0.1 10*3/uL (ref 0.0–0.7)
Eosinophils Relative: 2 % (ref 0–5)
HCT: 38.4 % (ref 36.0–46.0)
Hemoglobin: 13.1 g/dL (ref 12.0–15.0)
Lymphocytes Relative: 34 % (ref 12–46)
Lymphs Abs: 1.6 10*3/uL (ref 0.7–4.0)
MCH: 31.3 pg (ref 26.0–34.0)
MCHC: 34.1 g/dL (ref 30.0–36.0)
MCV: 91.9 fL (ref 78.0–100.0)
MONO ABS: 0.4 10*3/uL (ref 0.1–1.0)
Monocytes Relative: 9 % (ref 3–12)
NEUTROS ABS: 2.6 10*3/uL (ref 1.7–7.7)
Neutrophils Relative %: 55 % (ref 43–77)
Platelets: 307 10*3/uL (ref 150–400)
RBC: 4.18 MIL/uL (ref 3.87–5.11)
RDW: 14 % (ref 11.5–15.5)
WBC: 4.8 10*3/uL (ref 4.0–10.5)

## 2013-08-31 LAB — HEPATIC FUNCTION PANEL
ALBUMIN: 4.3 g/dL (ref 3.5–5.2)
ALT: 20 U/L (ref 0–35)
AST: 23 U/L (ref 0–37)
Alkaline Phosphatase: 90 U/L (ref 39–117)
BILIRUBIN INDIRECT: 0.4 mg/dL (ref 0.2–1.2)
Bilirubin, Direct: 0.1 mg/dL (ref 0.0–0.3)
TOTAL PROTEIN: 6.6 g/dL (ref 6.0–8.3)
Total Bilirubin: 0.5 mg/dL (ref 0.2–1.2)

## 2013-08-31 LAB — HEMOGLOBIN A1C
Hgb A1c MFr Bld: 5.6 % (ref <5.7)
MEAN PLASMA GLUCOSE: 114 mg/dL (ref <117)

## 2013-08-31 LAB — LIPID PANEL
CHOLESTEROL: 213 mg/dL — AB (ref 0–200)
HDL: 47 mg/dL (ref >39)
LDL Cholesterol: 132 mg/dL — ABNORMAL HIGH (ref 0–99)
TRIGLYCERIDES: 169 mg/dL — AB (ref <150)
Total CHOL/HDL Ratio: 4.5 Ratio
VLDL: 34 mg/dL (ref 0–40)

## 2013-08-31 LAB — BASIC METABOLIC PANEL WITH GFR
BUN: 8 mg/dL (ref 6–23)
CO2: 30 mEq/L (ref 19–32)
CREATININE: 0.81 mg/dL (ref 0.50–1.10)
Calcium: 9.5 mg/dL (ref 8.4–10.5)
Chloride: 101 mEq/L (ref 96–112)
GFR, EST AFRICAN AMERICAN: 88 mL/min
GFR, EST NON AFRICAN AMERICAN: 76 mL/min
GLUCOSE: 83 mg/dL (ref 70–99)
POTASSIUM: 4 meq/L (ref 3.5–5.3)
Sodium: 139 mEq/L (ref 135–145)

## 2013-08-31 LAB — MAGNESIUM: Magnesium: 2 mg/dL (ref 1.5–2.5)

## 2013-08-31 NOTE — Progress Notes (Signed)
Assessment:   1. HYPERTENSION - CBC with Differential - BASIC METABOLIC PANEL WITH GFR - Hepatic function panel - TSH - Urinalysis, Routine w reflex microscopic - Microalbumin / creatinine urine ratio - EKG 12-Lead - [place], RETROPERITNL ABD,  LTD - DG Chest 2 View; Future  2. Esophageal reflux Controlled diet  3. HYPERCHOLESTEROLEMIA - Lipid panel - Hemoglobin A1c  4. Unspecified vitamin D deficiency - Vit D  25 hydroxy (rtn osteoporosis monitoring)  5. Encounter for long-term (current) use of other medications - Magnesium   Plan:   During the course of the visit the patient was educated and counseled about appropriate screening and preventive services including:    Pneumococcal vaccine   Influenza vaccine  Td vaccine  Screening electrocardiogram  Screening mammography  Bone densitometry screening  Colorectal cancer screening  Diabetes screening  Glaucoma screening  Nutrition counseling   Screening recommendations, referrals:  Vaccinations: Tdap vaccine declines Influenza vaccine declined Pneumococcal vaccine declined Shingles vaccine declined Hep B vaccine declined  Nutrition assessed and recommended  Colonoscopy up to date Mammogram up to date Pap smear not indicated Pelvic exam not indicated Recommended yearly ophthalmology/optometry visit for glaucoma screening and checkup Recommended yearly dental visit for hygiene and checkup Advanced directives - requested  Conditions/risks identified: BMI: Discussed weight loss, diet, and increase physical activity.  Increase physical activity: AHA recommends 150 minutes of physical activity a week.  Medications reviewed DEXA- due 2016 Urinary Incontinence is not an issue: discussed non pharmacology and pharmacology options.  Fall risk: low- discussed PT, home fall assessment, medications.   Subjective:   Samantha [name] is a 68 y.o. female who presents for Medicare Annual Wellness Visit and  complete physical.    Date of last medicare wellness visit is unknown.  Her blood pressure has been controlled at home, today their BP is BP: 124/68 mmHg She does workout, she has joined silver sneakers but she has not been in a month because she has been very busy working part time for her sister[place] [employer] and she is going to [employer]. She denies chest pain, shortness of breath, dizziness.  She is on cholesterol medication and denies myalgias. Her cholesterol is at goal. The cholesterol last visit was:   Lab Results  Component Value Date   CHOL 213* 06/21/2013   HDL 52 06/21/2013   LDLCALC 121* 06/21/2013   TRIG 201* 06/21/2013   CHOLHDL 4.1 06/21/2013   She has been working on diet and exercise for prediabetes, and denies paresthesia of the feet, polydipsia and polyuria. Last A1C in the office was:  Patient is on Vitamin D supplement.   CXR 01/2013- lungs clear, quit smoking with chantix.  She has quit eating meat for the last 3-4 months.   Names of Other Physician/Practitioners you currently use: 1. [facility]- here for primary care 2. [facility], eye doctor, last visit this past year 3. Dr. Carlis Abbott dentist, last visit q 3-6 months Dr. Deatra Ina, GI Dr. Jeffie Pollock, urology Dr. Tonia Brooms, Derm- seeing next week Dr. Adriana Reams, Hemotology Patient Care Team: Unk Pinto, MD as PCP - General (Internal Medicine)   Medication Review Current Outpatient Prescriptions on File Prior to Visit  Medication Sig Dispense Refill  . ALPRAZolam (XANAX) 0.5 MG tablet TAKE 1-3 TABLETS BY MOUTH EVERY DAY AS NEEDED  90 tablet  0  . Cholecalciferol (VITAMIN D) 400 UNITS capsule Take by mouth daily.       . Citalopram Hydrobromide (CELEXA  PO) Take by mouth daily.      . fish oil-omega-3 fatty acids 1000 MG capsule Take 2 g by mouth daily.        . Flax OIL Take by mouth.        Marland Kitchen NIACIN CR PO Take by mouth daily.      . pravastatin (PRAVACHOL) 40 MG  tablet TAKE 1 TABLET BY MOUTH EVERY EVENING  30 tablet  0   No current facility-administered medications on file prior to visit.    Current Problems (verified) Patient Active Problem List   Diagnosis Date Noted  . Esophageal reflux 01/31/2011  . PALPITATIONS 08/16/2009  . ELECTROCARDIOGRAM, ABNORMAL 08/16/2009  . HYPERCHOLESTEROLEMIA 08/15/2009  . DEPRESSION 08/15/2009  . HYPERTENSION 08/15/2009  . INSOMNIA 08/15/2009    Screening Tests Health Maintenance  Topic Date Due  . Tetanus/tdap  10/22/1965  . Zostavax  10/23/2006  . Pneumococcal Polysaccharide Vaccine Age 16 And Over  10/23/2011  . Influenza Vaccine  11/06/2013  . Mammogram  10/09/2014  . Colonoscopy  11/05/2022    Preventative care: Last colonoscopy: 08/2012 due 10 years EGD 2007 with dilitation Last mammogram: 10/2012 Last pap smear/pelvic exam: 2011  DEXA: 10/2012 LMP 84 CXR 01/2013- NL  Prior vaccinations declines all   History reviewed: allergies, current medications, past family history, past medical history, past social history, past surgical history and problem list  Risk Factors: Osteoporosis: postmenopausal estrogen deficiency and dietary calcium and/or vitamin D deficiency History of fracture in the past year: no  Tobacco History  Substance Use Topics  . Smoking status: Former Smoker -- 1.00 packs/day for 35 years    Quit date: 03/28/2012  . Smokeless tobacco: Never Used     Comment: Counseling sheet given in exam room   . Alcohol Use: 0.6 oz/week    1 Glasses of wine per week   She does not smoke.  Patient is a former smoker. Are there smokers in your home (other than you)?  No  Alcohol Current alcohol use: social drinker  Caffeine Current caffeine use: tea 1-2 /day  Exercise Current exercise: walking  Nutrition/Diet Current diet: in general, a "healthy" diet    Cardiac risk factors: advanced age (older than 24 for men, 5 for women), dyslipidemia and  hypertension.  Depression Screen (Note: if answer to either of the following is "Yes", a more complete depression screening is indicated)   Q1: Over the past two weeks, have you felt down, depressed or hopeless? No  Q2: Over the past two weeks, have you felt little interest or pleasure in doing things? No  Have you lost interest or pleasure in daily life? No  Do you often feel hopeless? No  Do you cry easily over simple problems? No  Activities of Daily Living In your present state of health, do you have any difficulty performing the following activities?:  Driving? No Managing money?  No Feeding yourself? No Getting from bed to chair? No Climbing a flight of stairs? No Preparing food and eating?: No Bathing or showering? No Getting dressed: No Getting to the toilet? No Using the toilet:No Moving around from place to place: No In the past year have you fallen or had a near fall?:No   Are you sexually active?  No  Do you have more than one partner?  No  Vision Difficulties: No  Hearing Difficulties: No Do you often ask people to speak up or repeat themselves? No Do you experience ringing or noises in your ears? No  Do you have difficulty understanding soft or whispered voices? No  Cognition  Do you feel that you have a problem with memory?No  Do you often misplace items? No  Do you feel safe at home?  Yes  Advanced directives Does patient have a [place]? Yes Does patient have a Living Will? Yes   Objective:     Blood pressure 124/68, pulse 80, temperature 97.9 F (36.6 C), resp. rate 16, height 5\' 1"  (1.549 m), weight 112 lb 9.6 oz (51.075 kg). Body mass index is 21.29 kg/(m^2).  General appearance: alert, no distress, WD/WN,  female Cognitive Testing  Alert? Yes  Normal Appearance?Yes  Oriented to person? Yes  Place? Yes   Time? Yes  Recall of three objects?  Yes  Can perform simple calculations? Yes  Displays appropriate  judgment?Yes  Can read the correct time from a watch face?Yes  HEENT: normocephalic, sclerae anicteric, TMs pearly, nares patent, no discharge or erythema, pharynx normal Oral cavity: MMM, no lesions Neck: supple, no lymphadenopathy, no thyromegaly, no masses Heart: RRR, normal S1, S2, no murmurs Lungs: CTA bilaterally, no wheezes, rhonchi, or rales Abdomen: +bs, soft, non tender, non distended, no masses, no hepatomegaly, no splenomegaly Musculoskeletal: nontender, no swelling, no obvious deformity Extremities: no edema, no cyanosis, no clubbing Pulses: 2+ symmetric, upper and lower extremities, normal cap refill Neurological: alert, oriented x 3, CN2-12 intact, strength normal upper extremities and lower extremities, sensation normal throughout, DTRs 2+ throughout, no cerebellar signs, gait normal Psychiatric: normal affect, behavior normal, pleasant  Breast: defer EPP:IRJJO Rectal:   Medicare Attestation I have personally reviewed: The patient's medical and social history Their use of alcohol, tobacco or illicit drugs Their current medications and supplements The patient's functional ability including ADLs,fall risks, home safety risks, cognitive, and hearing and visual impairment Diet and physical activities Evidence for depression or mood disorders  The patient's weight, height, BMI, and visual acuity have been recorded in the chart.  I have made referrals, counseling, and provided education to the patient based on review of the above and I have provided the patient with a written personalized care plan for preventive services.     Vicie Mutters, PA-C   08/31/2013

## 2013-08-31 NOTE — Patient Instructions (Signed)

## 2013-09-01 LAB — URINALYSIS, ROUTINE W REFLEX MICROSCOPIC
Bilirubin Urine: NEGATIVE
Glucose, UA: NEGATIVE mg/dL
Hgb urine dipstick: NEGATIVE
Ketones, ur: NEGATIVE mg/dL
Nitrite: NEGATIVE
Protein, ur: NEGATIVE mg/dL
Specific Gravity, Urine: 1.016 (ref 1.005–1.030)
Urobilinogen, UA: 0.2 mg/dL (ref 0.0–1.0)
pH: 5.5 (ref 5.0–8.0)

## 2013-09-01 LAB — URINALYSIS, MICROSCOPIC ONLY
BACTERIA UA: NONE SEEN
CRYSTALS: NONE SEEN
Casts: NONE SEEN

## 2013-09-01 LAB — VITAMIN D 25 HYDROXY (VIT D DEFICIENCY, FRACTURES): Vit D, 25-Hydroxy: 72 ng/mL (ref 30–89)

## 2013-09-01 LAB — MICROALBUMIN / CREATININE URINE RATIO
Creatinine, Urine: 145.2 mg/dL
Microalb Creat Ratio: 5.4 mg/g (ref 0.0–30.0)
Microalb, Ur: 0.78 mg/dL (ref 0.00–1.89)

## 2013-09-01 LAB — TSH: TSH: 2.008 u[IU]/mL (ref 0.350–4.500)

## 2013-09-03 ENCOUNTER — Other Ambulatory Visit: Payer: Self-pay

## 2013-09-03 DIAGNOSIS — Z1231 Encounter for screening mammogram for malignant neoplasm of breast: Secondary | ICD-10-CM

## 2013-09-13 ENCOUNTER — Encounter: Payer: Self-pay | Admitting: Physician Assistant

## 2013-09-13 ENCOUNTER — Other Ambulatory Visit: Payer: Self-pay | Admitting: Physician Assistant

## 2013-10-01 ENCOUNTER — Other Ambulatory Visit: Payer: Self-pay | Admitting: Emergency Medicine

## 2013-10-14 ENCOUNTER — Ambulatory Visit
Admission: RE | Admit: 2013-10-14 | Discharge: 2013-10-14 | Disposition: A | Discharge status: Home / Self Care | Payer: Medicare Other | Source: Ambulatory Visit

## 2013-10-14 DIAGNOSIS — Z1231 Encounter for screening mammogram for malignant neoplasm of breast: Secondary | ICD-10-CM

## 2013-10-16 ENCOUNTER — Other Ambulatory Visit: Payer: Self-pay | Admitting: Physician Assistant

## 2013-10-20 ENCOUNTER — Other Ambulatory Visit: Payer: Self-pay | Admitting: Physician Assistant

## 2013-10-20 ENCOUNTER — Encounter: Payer: Medicare Other | Admitting: Physician Assistant

## 2013-10-20 ENCOUNTER — Encounter: Payer: Self-pay | Admitting: Physician Assistant

## 2013-10-20 MED ORDER — BUPROPION HCL ER (XL) 150 MG PO TB24: 150.0000 mg | ORAL_TABLET | ORAL | Status: DC

## 2013-10-23 NOTE — Progress Notes (Signed)
This encounter was created in error - please disregard.

## 2013-11-29 ENCOUNTER — Other Ambulatory Visit: Payer: Self-pay | Admitting: Physician Assistant

## 2013-11-29 MED ORDER — CITALOPRAM HYDROBROMIDE 40 MG PO TABS: ORAL_TABLET | ORAL | Status: DC

## 2013-11-29 MED ORDER — ALPRAZOLAM 0.5 MG PO TABS: ORAL_TABLET | ORAL | Status: DC

## 2013-12-24 ENCOUNTER — Other Ambulatory Visit: Payer: Self-pay | Admitting: Physician Assistant

## 2014-02-07 ENCOUNTER — Other Ambulatory Visit: Payer: Self-pay | Admitting: Internal Medicine

## 2014-03-07 ENCOUNTER — Ambulatory Visit: Payer: Self-pay | Admitting: Internal Medicine

## 2014-03-21 ENCOUNTER — Other Ambulatory Visit: Payer: Self-pay | Admitting: Physician Assistant

## 2014-03-23 ENCOUNTER — Ambulatory Visit: Payer: Self-pay | Admitting: Internal Medicine

## 2014-04-06 ENCOUNTER — Other Ambulatory Visit: Payer: Self-pay | Admitting: Physician Assistant

## 2014-04-27 ENCOUNTER — Ambulatory Visit: Payer: Self-pay | Admitting: Internal Medicine

## 2014-05-04 ENCOUNTER — Ambulatory Visit (INDEPENDENT_AMBULATORY_CARE_PROVIDER_SITE_OTHER): Payer: Medicare Other | Admitting: Internal Medicine

## 2014-05-04 ENCOUNTER — Encounter: Payer: Self-pay | Admitting: Internal Medicine

## 2014-05-04 VITALS — BP 112/60 | HR 72 | Temp 97.7°F | Resp 16 | Ht 61.0 in | Wt 112.8 lb

## 2014-05-04 DIAGNOSIS — I10 Essential (primary) hypertension: Secondary | ICD-10-CM

## 2014-05-04 DIAGNOSIS — E7800 Pure hypercholesterolemia, unspecified: Secondary | ICD-10-CM

## 2014-05-04 DIAGNOSIS — E559 Vitamin D deficiency, unspecified: Secondary | ICD-10-CM

## 2014-05-04 DIAGNOSIS — Z79899 Other long term (current) drug therapy: Secondary | ICD-10-CM

## 2014-05-04 DIAGNOSIS — R7309 Other abnormal glucose: Secondary | ICD-10-CM

## 2014-05-04 LAB — CBC WITH DIFFERENTIAL/PLATELET
Basophils Absolute: 0.1 10*3/uL (ref 0.0–0.1)
Basophils Relative: 1 % (ref 0–1)
EOS ABS: 0.1 10*3/uL (ref 0.0–0.7)
Eosinophils Relative: 1 % (ref 0–5)
HCT: 40.9 % (ref 36.0–46.0)
Hemoglobin: 13.7 g/dL (ref 12.0–15.0)
LYMPHS PCT: 25 % (ref 12–46)
Lymphs Abs: 1.4 10*3/uL (ref 0.7–4.0)
MCH: 31.7 pg (ref 26.0–34.0)
MCHC: 33.5 g/dL (ref 30.0–36.0)
MCV: 94.7 fL (ref 78.0–100.0)
MONO ABS: 0.4 10*3/uL (ref 0.1–1.0)
MPV: 9 fL (ref 8.6–12.4)
Monocytes Relative: 8 % (ref 3–12)
Neutro Abs: 3.5 10*3/uL (ref 1.7–7.7)
Neutrophils Relative %: 65 % (ref 43–77)
Platelets: 280 10*3/uL (ref 150–400)
RBC: 4.32 MIL/uL (ref 3.87–5.11)
RDW: 13 % (ref 11.5–15.5)
WBC: 5.4 10*3/uL (ref 4.0–10.5)

## 2014-05-04 LAB — HEMOGLOBIN A1C
HEMOGLOBIN A1C: 5.5 % (ref <5.7)
MEAN PLASMA GLUCOSE: 111 mg/dL (ref <117)

## 2014-05-04 MED ORDER — ALPRAZOLAM 0.5 MG PO TABS: ORAL_TABLET | ORAL | Status: AC

## 2014-05-04 NOTE — Patient Instructions (Signed)

## 2014-05-04 NOTE — Progress Notes (Signed)
Patient ID: Samantha [name], female   DOB: [birth year], 69 y.o.   MRN: [id number]   This very nice 69 y.o.female presents for 3 month follow up with Hypertension, Hyperlipidemia, Pre-Diabetes and Vitamin D Deficiency.    Patient is treated for HTN & BP has been controlled at home. Today's BP: 112/60 mmHg. Patient has had no complaints of any cardiac type chest pain, palpitations, dyspnea/orthopnea/PND, dizziness, claudication, or dependent edema.   Hyperlipidemia is controlled with diet & meds. Patient denies myalgias or other med SE's. Last Lipids were not at goal last checked and patient has decided to self discontinue her chol meds and try on diet alone. Her last  Lipids  were Total Chol  213; HDL  47; LDL 132; and Trig 169 on 08/31/2013.   Also, the patient has history of PreDiabetes with an elevated A1c of 5.7% in May 2013 and has had no symptoms of reactive hypoglycemia, diabetic polys, paresthesias or visual blurring.  Last A1c was 5.6% on 08/31/2013.   Further, the patient also has history of Vitamin D Deficiency and supplements vitamin D without any suspected side-effects. Last vitamin D was 72 08/31/2013.  Medication Sig  . Cholecalciferol (VITAMIN D) 400 UNITS capsule Take by mouth daily.   . citalopram (CELEXA) 40 MG tablet 1/2-1 pill daily  . Flax OIL Take by mouth.    Marland Kitchen NIACIN CR PO Take by mouth daily.  . pravastatin (PRAVACHOL) 40 MG tablet TAKE 1 TABLET BY MOUTH EVERY EVENING (Patient not taking: Reported on 05/04/2014)  . ALPRAZolam (XANAX) 0.5 MG tablet TAKE 1-3 TABLETS BY MOUTH DAILY AS NEEDED  . buPROPion (WELLBUTRIN XL) 150 MG 24 hr tablet Take 1 tablet (150 mg total) by mouth every morning.   Allergies  Allergen Reactions  . Codeine   . Decongestant [Pseudoephedrine Hcl]     PMHx:   Past Medical History  Diagnosis Date  . Unspecified essential hypertension   . Pure hypercholesterolemia   . Insomnia, unspecified   . Depressive disorder, not elsewhere classified   .  Breast cancer     left   . Anxiety   . GERD (gastroesophageal reflux disease)   . IBS (irritable bowel syndrome)   . Hx of colonic polyps 2003    Colonoscopy   . Esophagitis, unspecified 2001    EGD  . Stricture and stenosis of esophagus 2001, 2007    EGD  . Diverticulosis of colon (without mention of hemorrhage) 2007    Colonoscopy  . Internal hemorrhoids without mention of complication 7829    Colonoscopy    Past Surgical History  Procedure Laterality Date  . Abdominal hysterectomy    . Cholecystectomy  2004  . Breast lumpectomy  2005    left  . Nasal sinus surgery     FHx:    Reviewed / unchanged  SHx:    Reviewed / unchanged  Systems Review:  Constitutional: Denies fever, chills, wt changes, headaches, insomnia, fatigue, night sweats, change in appetite. Eyes: Denies redness, blurred vision, diplopia, discharge, itchy, watery eyes.  ENT: Denies discharge, congestion, post nasal drip, epistaxis, sore throat, earache, hearing loss, dental pain, tinnitus, vertigo, sinus pain, snoring.  CV: Denies chest pain, palpitations, irregular heartbeat, syncope, dyspnea, diaphoresis, orthopnea, PND, claudication or edema. Respiratory: denies cough, dyspnea, DOE, pleurisy, hoarseness, laryngitis, wheezing.  Gastrointestinal: Denies dysphagia, odynophagia, heartburn, reflux, water brash, abdominal pain or cramps, nausea, vomiting, bloating, diarrhea, constipation, hematemesis, melena, hematochezia  or hemorrhoids. Genitourinary: Denies dysuria, frequency, urgency, nocturia, hesitancy, discharge,  hematuria or flank pain. Musculoskeletal: Denies arthralgias, myalgias, stiffness, jt. swelling, pain, limping or strain/sprain.  Skin: Denies pruritus, rash, hives, warts, acne, eczema or change in skin lesion(s). Neuro: No weakness, tremor, incoordination, spasms, paresthesia or pain. Psychiatric: Denies confusion, memory loss or sensory loss. Endo: Denies change in weight, skin or hair change.   Heme/Lymph: No excessive bleeding, bruising or enlarged lymph nodes.  Physical Exam  BP 112/60   Pulse 72  Temp 97.7 F   Resp 16  Ht 5\' 1"    Wt 112 lb 12.8 oz   BMI 21.32   Appears well nourished and in no distress. Eyes: PERRLA, EOMs, conjunctiva no swelling or erythema. Sinuses: No frontal/maxillary tenderness ENT/Mouth: EAC's clear, TM's nl w/o erythema, bulging. Nares clear w/o erythema, swelling, exudates. Oropharynx clear without erythema or exudates. Oral hygiene is good. Tongue normal, non obstructing. Hearing intact.  Neck: Supple. Thyroid nl. Car 2+/2+ without bruits, nodes or JVD. Chest: Respirations nl with BS clear & equal w/o rales, rhonchi, wheezing or stridor.  Cor: Heart sounds normal w/ regular rate and rhythm without sig. murmurs, gallops, clicks, or rubs. Peripheral pulses normal and equal  without edema.  Abdomen: Soft & bowel sounds normal. Non-tender w/o guarding, rebound, hernias, masses, or organomegaly.  GU: reports recent urinary frequency & dysuria. Lymphatics: Unremarkable.  Musculoskeletal: Full ROM all peripheral extremities, joint stability, 5/5 strength, and normal gait.  Skin: Warm, dry without exposed rashes, lesions or ecchymosis apparent.  Neuro: Cranial nerves intact, reflexes equal bilaterally. Sensory-motor testing grossly intact. Tendon reflexes grossly intact.  Pysch: Alert & oriented x 3.  Insight and judgement nl & appropriate. No ideations.  Assessment and Plan:  1. Essential hypertension  - TSH  2. HYPERCHOLESTEROLEMIA  - Lipid panel  3. Abnormal glucose  - Hemoglobin A1c - Insulin, fasting  4. Vitamin D deficiency  - Vit D  25 hydroxy  5. Medication management  - CBC with Differential/Platelet - BASIC METABOLIC PANEL WITH GFR - Hepatic function panel - Magnesium  6. Dysuria - u/a & c/s   Recommended regular exercise, BP monitoring, weight control, and discussed med and SE's. Recommended labs to assess and monitor  clinical status. Further disposition pending results of labs.

## 2014-05-05 LAB — VITAMIN D 25 HYDROXY (VIT D DEFICIENCY, FRACTURES): Vit D, 25-Hydroxy: 51 ng/mL (ref 30–100)

## 2014-05-05 LAB — LIPID PANEL
Cholesterol: 240 mg/dL — ABNORMAL HIGH (ref 0–200)
HDL: 52 mg/dL (ref >39)
LDL CALC: 166 mg/dL — AB (ref 0–99)
Total CHOL/HDL Ratio: 4.6 Ratio
Triglycerides: 109 mg/dL (ref <150)
VLDL: 22 mg/dL (ref 0–40)

## 2014-05-05 LAB — BASIC METABOLIC PANEL WITH GFR
BUN: 12 mg/dL (ref 6–23)
CO2: 27 mEq/L (ref 19–32)
CREATININE: 0.66 mg/dL (ref 0.50–1.10)
Calcium: 9.2 mg/dL (ref 8.4–10.5)
Chloride: 103 mEq/L (ref 96–112)
GFR, Est African American: >89 mL/min
GFR, Est Non African American: >89 mL/min
Glucose, Bld: 88 mg/dL (ref 70–99)
POTASSIUM: 4.3 meq/L (ref 3.5–5.3)
SODIUM: 140 meq/L (ref 135–145)

## 2014-05-05 LAB — HEPATIC FUNCTION PANEL
ALK PHOS: 96 U/L (ref 39–117)
ALT: 17 U/L (ref 0–35)
AST: 21 U/L (ref 0–37)
Albumin: 4.5 g/dL (ref 3.5–5.2)
BILIRUBIN INDIRECT: 0.7 mg/dL (ref 0.2–1.2)
BILIRUBIN TOTAL: 0.8 mg/dL (ref 0.2–1.2)
Bilirubin, Direct: 0.1 mg/dL (ref 0.0–0.3)
Total Protein: 6.9 g/dL (ref 6.0–8.3)

## 2014-05-05 LAB — MAGNESIUM: Magnesium: 2 mg/dL (ref 1.5–2.5)

## 2014-05-05 LAB — INSULIN, FASTING: INSULIN FASTING, SERUM: 4.1 u[IU]/mL (ref 2.0–19.6)

## 2014-05-05 LAB — TSH: TSH: 2.552 u[IU]/mL (ref 0.350–4.500)

## 2014-05-11 ENCOUNTER — Ambulatory Visit: Payer: Self-pay | Admitting: Internal Medicine

## 2014-06-08 ENCOUNTER — Other Ambulatory Visit: Payer: Self-pay | Admitting: *Deleted

## 2014-06-08 MED ORDER — VARENICLINE TARTRATE 1 MG PO TABS: 1.0000 mg | ORAL_TABLET | Freq: Two times a day (BID) | ORAL | Status: DC

## 2014-07-04 ENCOUNTER — Encounter: Payer: Self-pay | Admitting: Physician Assistant

## 2014-07-04 ENCOUNTER — Ambulatory Visit (INDEPENDENT_AMBULATORY_CARE_PROVIDER_SITE_OTHER): Payer: Medicare Other | Admitting: Physician Assistant

## 2014-07-04 VITALS — BP 102/68 | HR 80 | Temp 97.7°F | Resp 16 | Ht 61.0 in | Wt 114.0 lb

## 2014-07-04 DIAGNOSIS — F325 Major depressive disorder, single episode, in full remission: Secondary | ICD-10-CM

## 2014-07-04 DIAGNOSIS — E7800 Pure hypercholesterolemia, unspecified: Secondary | ICD-10-CM

## 2014-07-04 DIAGNOSIS — R6889 Other general symptoms and signs: Secondary | ICD-10-CM

## 2014-07-04 DIAGNOSIS — Z0001 Encounter for general adult medical examination with abnormal findings: Secondary | ICD-10-CM

## 2014-07-04 DIAGNOSIS — Z789 Other specified health status: Secondary | ICD-10-CM

## 2014-07-04 DIAGNOSIS — R7309 Other abnormal glucose: Secondary | ICD-10-CM

## 2014-07-04 DIAGNOSIS — Z0000 Encounter for general adult medical examination without abnormal findings: Secondary | ICD-10-CM

## 2014-07-04 DIAGNOSIS — K219 Gastro-esophageal reflux disease without esophagitis: Secondary | ICD-10-CM

## 2014-07-04 DIAGNOSIS — Z79899 Other long term (current) drug therapy: Secondary | ICD-10-CM

## 2014-07-04 DIAGNOSIS — I10 Essential (primary) hypertension: Secondary | ICD-10-CM

## 2014-07-04 DIAGNOSIS — R195 Other fecal abnormalities: Secondary | ICD-10-CM

## 2014-07-04 DIAGNOSIS — E559 Vitamin D deficiency, unspecified: Secondary | ICD-10-CM

## 2014-07-04 DIAGNOSIS — G4700 Insomnia, unspecified: Secondary | ICD-10-CM

## 2014-07-04 NOTE — Patient Instructions (Addendum)
Preventive Care for Adults A healthy lifestyle and preventive care can promote health and wellness. Preventive health guidelines for women include the following key practices.  A routine yearly physical is a good way to check with your health care provider about your health and preventive screening. It is a chance to share any concerns and updates on your health and to receive a thorough exam.  Visit your dentist for a routine exam and preventive care every 6 months. Brush your teeth twice a day and floss once a day. Good oral hygiene prevents tooth decay and gum disease.  The frequency of eye exams is based on your age, health, family medical history, use of contact lenses, and other factors. Follow your health care provider's recommendations for frequency of eye exams.  Eat a healthy diet. Foods like vegetables, fruits, whole grains, low-fat dairy products, and lean protein foods contain the nutrients you need without too many calories. Decrease your intake of foods high in solid fats, added sugars, and salt. Eat the right amount of calories for you.Get information about a proper diet from your health care provider, if necessary.  Regular physical exercise is one of the most important things you can do for your health. Most adults should get at least 150 minutes of moderate-intensity exercise (any activity that increases your heart rate and causes you to sweat) each week. In addition, most adults need muscle-strengthening exercises on 2 or more days a week.  Maintain a healthy weight. The body mass index (BMI) is a screening tool to identify possible weight problems. It provides an estimate of body fat based on height and weight. Your health care provider can find your BMI and can help you achieve or maintain a healthy weight.For adults 20 years and older:  A BMI below 18.5 is considered underweight.  A BMI of 18.5 to 24.9 is normal.  A BMI of 25 to 29.9 is considered overweight.  A BMI of  30 and above is considered obese.  Maintain normal blood lipids and cholesterol levels by exercising and minimizing your intake of saturated fat. Eat a balanced diet with plenty of fruit and vegetables. If your lipid or cholesterol levels are high, you are over 50, or you are at high risk for heart disease, you may need your cholesterol levels checked more frequently.Ongoing high lipid and cholesterol levels should be treated with medicines if diet and exercise are not working.  If you smoke, find out from your health care provider how to quit. If you do not use tobacco, do not start.  Lung cancer screening is recommended for adults aged 86-80 years who are at high risk for developing lung cancer because of a history of smoking. A yearly low-dose CT scan of the lungs is recommended for people who have at least a 30-pack-year history of smoking and are a current smoker or have quit within the past 15 years. A pack year of smoking is smoking an average of 1 pack of cigarettes a day for 1 year (for example: 1 pack a day for 30 years or 2 packs a day for 15 years). Yearly screening should continue until the smoker has stopped smoking for at least 15 years. Yearly screening should be stopped for people who develop a health problem that would prevent them from having lung cancer treatment.  Avoid use of street drugs. Do not share needles with anyone. Ask for help if you need support or instructions about stopping the use of drugs.  High blood  pressure causes heart disease and increases the risk of stroke.  Ongoing high blood pressure should be treated with medicines if weight loss and exercise do not work.  If you are 55-79 years old, ask your health care provider if you should take aspirin to prevent strokes.  Diabetes screening involves taking a blood sample to check your fasting blood sugar level. This should be done once every 3 years, after age 45, if you are within normal weight and without risk  factors for diabetes. Testing should be considered at a younger age or be carried out more frequently if you are overweight and have at least 1 risk factor for diabetes.  Breast cancer screening is essential preventive care for women. You should practice "breast self-awareness." This means understanding the normal appearance and feel of your breasts and may include breast self-examination. Any changes detected, no matter how small, should be reported to a health care provider. Women in their 20s and 30s should have a clinical breast exam (CBE) by a health care provider as part of a regular health exam every 1 to 3 years. After age 40, women should have a CBE every year. Starting at age 40, women should consider having a mammogram (breast X-ray test) every year. Women who have a family history of breast cancer should talk to their health care provider about genetic screening. Women at a high risk of breast cancer should talk to their health care providers about having an MRI and a mammogram every year.  Breast cancer gene (BRCA)-related cancer risk assessment is recommended for women who have family members with BRCA-related cancers. BRCA-related cancers include breast, ovarian, tubal, and peritoneal cancers. Having family members with these cancers may be associated with an increased risk for harmful changes (mutations) in the breast cancer genes BRCA1 and BRCA2. Results of the assessment will determine the need for genetic counseling and BRCA1 and BRCA2 testing.  Routine pelvic exams to screen for cancer are no longer recommended for nonpregnant women who are considered low risk for cancer of the pelvic organs (ovaries, uterus, and vagina) and who do not have symptoms. Ask your health care provider if a screening pelvic exam is right for you.  If you have had past treatment for cervical cancer or a condition that could lead to cancer, you need Pap tests and screening for cancer for at least 20 years after  your treatment. If Pap tests have been discontinued, your risk factors (such as having a new sexual partner) need to be reassessed to determine if screening should be resumed. Some women have medical problems that increase the chance of getting cervical cancer. In these cases, your health care provider may recommend more frequent screening and Pap tests.    Colorectal cancer can be detected and often prevented. Most routine colorectal cancer screening begins at the age of 50 years and continues through age 75 years. However, your health care provider may recommend screening at an earlier age if you have risk factors for colon cancer. On a yearly basis, your health care provider may provide home test kits to check for hidden blood in the stool. Use of a small camera at the end of a tube, to directly examine the colon (sigmoidoscopy or colonoscopy), can detect the earliest forms of colorectal cancer. Talk to your health care provider about this at age 50, when routine screening begins. Direct exam of the colon should be repeated every 5-10 years through age 75 years, unless early forms of pre-cancerous polyps   or small growths are found.  Osteoporosis is a disease in which the bones lose minerals and strength with aging. This can result in serious bone fractures or breaks. The risk of osteoporosis can be identified using a bone density scan. Women ages 62 years and over and women at risk for fractures or osteoporosis should discuss screening with their health care providers. Ask your health care provider whether you should take a calcium supplement or vitamin D to reduce the rate of osteoporosis.  Menopause can be associated with physical symptoms and risks. Hormone replacement therapy is available to decrease symptoms and risks. You should talk to your health care provider about whether hormone replacement therapy is right for you.  Use sunscreen. Apply sunscreen liberally and repeatedly throughout the day.  You should seek shade when your shadow is shorter than you. Protect yourself by wearing long sleeves, pants, a wide-brimmed hat, and sunglasses year round, whenever you are outdoors.  Once a month, do a whole body skin exam, using a mirror to look at the skin on your back. Tell your health care provider of new moles, moles that have irregular borders, moles that are larger than a pencil eraser, or moles that have changed in shape or color.  Stay current with required vaccines (immunizations).  Influenza vaccine. All adults should be immunized every year.  Tetanus, diphtheria, and acellular pertussis (Td, Tdap) vaccine. Pregnant women should receive 1 dose of Tdap vaccine during each pregnancy. The dose should be obtained regardless of the length of time since the last dose. Immunization is preferred during the 27th-36th week of gestation. An adult who has not previously received Tdap or who does not know her vaccine status should receive 1 dose of Tdap. This initial dose should be followed by tetanus and diphtheria toxoids (Td) booster doses every 10 years. Adults with an unknown or incomplete history of completing a 3-dose immunization series with Td-containing vaccines should begin or complete a primary immunization series including a Tdap dose. Adults should receive a Td booster every 10 years.    Zoster vaccine. One dose is recommended for adults aged 4 years or older unless certain conditions are present.    Pneumococcal 13-valent conjugate (PCV13) vaccine. When indicated, a person who is uncertain of her immunization history and has no record of immunization should receive the PCV13 vaccine. An adult aged 35 years or older who has certain medical conditions and has not been previously immunized should receive 1 dose of PCV13 vaccine. This PCV13 should be followed with a dose of pneumococcal polysaccharide (PPSV23) vaccine. The PPSV23 vaccine dose should be obtained at least 8 weeks after the  dose of PCV13 vaccine. An adult aged 85 years or older who has certain medical conditions and previously received 1 or more doses of PPSV23 vaccine should receive 1 dose of PCV13. The PCV13 vaccine dose should be obtained 1 or more years after the last PPSV23 vaccine dose.    Pneumococcal polysaccharide (PPSV23) vaccine. When PCV13 is also indicated, PCV13 should be obtained first. All adults aged 15 years and older should be immunized. An adult younger than age 38 years who has certain medical conditions should be immunized. Any person who resides in a nursing home or long-term care facility should be immunized. An adult smoker should be immunized. People with an immunocompromised condition and certain other conditions should receive both PCV13 and PPSV23 vaccines. People with human immunodeficiency virus (HIV) infection should be immunized as soon as possible after diagnosis. Immunization during  chemotherapy or radiation therapy should be avoided. Routine use of PPSV23 vaccine is not recommended for American Indians, [place] Natives, or people younger than 65 years unless there are medical conditions that require PPSV23 vaccine. When indicated, people who have unknown immunization and have no record of immunization should receive PPSV23 vaccine. One-time revaccination 5 years after the first dose of PPSV23 is recommended for people aged 19-64 years who have chronic kidney failure, nephrotic syndrome, asplenia, or immunocompromised conditions. People who received 1-2 doses of PPSV23 before age 89 years should receive another dose of PPSV23 vaccine at age 29 years or later if at least 5 years have passed since the previous dose. Doses of PPSV23 are not needed for people immunized with PPSV23 at or after age 49 years.   Preventive Services / Frequency  Ages 44 years and over  Blood pressure check.  Lipid and cholesterol check.  Lung cancer screening. / Every year if you are aged 32-80 years and have a  30-pack-year history of smoking and currently smoke or have quit within the past 15 years. Yearly screening is stopped once you have quit smoking for at least 15 years or develop a health problem that would prevent you from having lung cancer treatment.  Clinical breast exam.** / Every year after age 42 years.  BRCA-related cancer risk assessment.** / For women who have family members with a BRCA-related cancer (breast, ovarian, tubal, or peritoneal cancers).  Mammogram.** / Every year beginning at age 32 years and continuing for as long as you are in good health. Consult with your health care provider.  Pap test.** / Every 3 years starting at age 40 years through age 62 or 78 years with 3 consecutive normal Pap tests. Testing can be stopped between 65 and 70 years with 3 consecutive normal Pap tests and no abnormal Pap or HPV tests in the past 10 years.  Fecal occult blood test (FOBT) of stool. / Every year beginning at age 75 years and continuing until age 32 years. You may not need to do this test if you get a colonoscopy every 10 years.  Flexible sigmoidoscopy or colonoscopy.** / Every 5 years for a flexible sigmoidoscopy or every 10 years for a colonoscopy beginning at age 72 years and continuing until age 66 years.  Hepatitis C blood test.** / For all people born from [birth year] through 1964 and any individual with known risks for hepatitis C.  Osteoporosis screening.** / A one-time screening for women ages 63 years and over and women at risk for fractures or osteoporosis.  Skin self-exam. / Monthly.  Influenza vaccine. / Every year.  Tetanus, diphtheria, and acellular pertussis (Tdap/Td) vaccine.** / 1 dose of Td every 10 years.  Zoster vaccine.** / 1 dose for adults aged 76 years or older.  Pneumococcal 13-valent conjugate (PCV13) vaccine.** / Consult your health care provider.  Pneumococcal polysaccharide (PPSV23) vaccine.** / 1 dose for all adults aged 33 years and older. Screening  for abdominal aortic aneurysm (AAA)  by ultrasound is recommended for people who have history of high blood pressure or who are current or former smokers.   Bloody Stools Bloody stools means there is blood in your poop (stool). It is a sign that there is a problem somewhere in the digestive system. It is important for your doctor to find the cause of your bleeding, so the problem can be treated.  HOME CARE  Only take medicine as told by your doctor.  Eat foods with fiber (prunes, bran  cereals).  Drink enough fluids to keep your pee (urine) clear or pale yellow.  Sit in warm water (sitz bath) for 10 to 15 minutes as told by your doctor.  Know how to take your medicines (enemas, suppositories) if advised by your doctor.  Watch for signs that you are getting better or getting worse. GET HELP RIGHT AWAY IF:   You are not getting better.  You start to get better but then get worse again.  You have new problems.  You have severe bleeding from the place where poop comes out (rectum) that does not stop.  You throw up (vomit) blood.  You feel weak or pass out (faint).  You have a fever. MAKE SURE YOU:   Understand these instructions.  Will watch your condition.  Will get help right away if you are not doing well or get worse. Document Released: 03/13/2009 Document Revised: 06/17/2011 Document Reviewed: 08/10/2010 [facility] Patient Information 2015 [place], [place]. This information is not intended to replace advice given to you by your health care provider. Make sure you discuss any questions you have with your health care provider.

## 2014-07-04 NOTE — Progress Notes (Signed)
MEDICARE ANNUAL WELLNESS VISIT AND FOLLOW UP  Assessment:   1. Essential hypertension - continue medications, DASH diet, exercise and monitor at home. Call if greater than 130/80.   2. HYPERCHOLESTEROLEMIA -continue medications, check lipids, decrease fatty foods, increase activity.   3. Abnormal glucose Discussed general issues about diabetes pathophysiology and management., Educational material distributed., Suggested low cholesterol diet., Encouraged aerobic exercise., Discussed foot care., Reminded to get yearly retinal exam.  4. Vitamin D deficiency Continue supplement  5. Medication management  6. Insomnia Insomnia- good sleep hygiene discussed, increase day time activity.  7. Major depression in remission Off medications and doing well with diet/exercise.   8. Gastroesophageal reflux disease without esophagitis Continue PPI/H2 blocker, diet discussed  9. Routine general medical examination at a health care facility  10. Patient had no falls in past year  11. Abnormal stools ? If from beets versus hemohrroids- normal colonoscopy with Dr. Deatra Ina 2014, guaiac negative stool in office, no masses appreciated and benign AB- monitor at home, if continues follow up with Dr. Deatra Ina.   Over 30 minutes of exam, counseling, chart review, and critical decision making was performed  Plan:   During the course of the visit the patient was educated and counseled about appropriate screening and preventive services including:    Pneumococcal vaccine   Influenza vaccine  Td vaccine  Screening electrocardiogram  Screening mammography  Bone densitometry screening  Colorectal cancer screening  Diabetes screening  Glaucoma screening  Nutrition counseling   Advanced directives: given info/requested  Conditions/risks identified: No DM Urinary Incontinence is not an issue: discussed non pharmacology and pharmacology options.  Fall risk: low- discussed PT, home fall  assessment, medications.    Subjective:   Samantha [name] is a 69 y.o. female who presents for Medicare Annual Wellness Visit and 3 month follow up on hypertension, prediabetes, hyperlipidemia, vitamin D def.  Date of last medicare wellness visit 06/21/2013  Her blood pressure has been controlled at home, today their BP is BP: 102/68 mmHg She does workout. She denies chest pain, shortness of breath, dizziness.  She is on cholesterol medication and denies myalgias. Her cholesterol is at goal. The cholesterol last visit was:   Lab Results  Component Value Date   CHOL 240* 05/04/2014   HDL 52 05/04/2014   LDLCALC 166* 05/04/2014   TRIG 109 05/04/2014   CHOLHDL 4.6 05/04/2014   Last A1C in the office was:  Lab Results  Component Value Date   HGBA1C 5.5 05/04/2014  Patient is on Vitamin D supplement. Lab Results  Component Value Date   VD25OH 51 05/04/2014  she is off celexa for depression and is doing well.  She uses the xanax at night for insomnia, not during the day.   She states that she got back from vacation, and since then she has had 3 stools, softer than usually with pink/red tint to them. Did have beats last Thursday but no other things that were red. Denies AB pain but has had a tender naval. Denies fever, chills, nausea, GERD, no pain with BM, no straining. Was on ABX x 8 days for her gums/teeth.  Medication Review Current Outpatient Prescriptions on File Prior to Visit  Medication Sig Dispense Refill  . ALPRAZolam (XANAX) 0.5 MG tablet Take 1/2 to 1 tablet 3 x day if needed for Anxiety or Sleep 90 tablet 1  . Cholecalciferol (VITAMIN D) 400 UNITS capsule Take by mouth daily.     . Flax OIL Take by mouth.      Marland Kitchen  NIACIN CR PO Take by mouth daily.     No current facility-administered medications on file prior to visit.    Current Problems (verified) Patient Active Problem List   Diagnosis Date Noted  . Abnormal glucose 05/04/2014  . Vitamin D deficiency 05/04/2014   . Medication management 05/04/2014  . Esophageal reflux 01/31/2011  . PALPITATIONS 08/16/2009  . ELECTROCARDIOGRAM, ABNORMAL 08/16/2009  . HYPERCHOLESTEROLEMIA 08/15/2009  . DEPRESSION 08/15/2009  . Essential hypertension 08/15/2009  . INSOMNIA 08/15/2009   Preventative care: Last colonoscopy: 10/2012 Last mammogram: 10/2013 Last pap smear/pelvic exam: 2011 remote, declines another DEXA: 10/2012- osteopenia due this year Ct chest 2009  Prior vaccinations: TD or Tdap: 1999 and declines Influenza: declines Pneumococcal: 2000 declines Prevnar 13 declines Shingles/Zostavax: declines  Names of Other Physician/Practitioners you currently use: 1. [facility]- here for primary care 2. [facility], eye doctor, last visit 07/2013 3. Dr. Carlis Abbott, dentist, last visit 07/2014 4. Dr. Deatra Ina 5. Dr. Jeffie Pollock 6. Dr. Tonia Brooms 7. Dr. Adriana Reams Patient Care Team: Unk Pinto, MD as PCP - General (Internal Medicine)  Past Surgical History  Procedure Laterality Date  . Abdominal hysterectomy    . Cholecystectomy  2004  . Breast lumpectomy  2005    left  . Nasal sinus surgery     Family History  Problem Relation Age of Onset  . Bipolar disorder Mother   . Breast cancer Mother   . Heart disease Mother   . Colon cancer Neg Hx   . Heart disease Father   . Parkinson's disease Sister    History  Substance Use Topics  . Smoking status: Former Smoker -- 1.00 packs/day for 35 years    Quit date: 03/28/2012  . Smokeless tobacco: Never Used     Comment: Counseling sheet given in exam room   . Alcohol Use: 0.6 oz/week    1 Glasses of wine per week    MEDICARE WELLNESS OBJECTIVES: Tobacco use: She does not smoke.  Patient is a former smoker. Alcohol Current alcohol use: social drinker Caffeine Current caffeine use: coffee 1 /day Osteoporosis: postmenopausal estrogen deficiency and dietary calcium and/or vitamin D deficiency, History of  fracture in the past year: no Diet: in general, a "healthy" diet   Physical activity: walking and exercise class Depression/mood screen:  Yes - No Depression Hearing: normal Visual acuity: normal,  does perform annual eye exam  ADLs: self care Fall risk: Low Risk Home safety: good Cognitive Testing  Alert? Yes  Normal Appearance?Yes  Oriented to person? Yes  Place? Yes   Time? Yes  Recall of three objects?  Yes  Can perform simple calculations? Yes  Displays appropriate judgment?Yes  Can read the correct time from a watch face?Yes EOL planning: Yes   Objective:   Blood pressure 102/68, pulse 80, temperature 97.7 F (36.5 C), resp. rate 16, height 5\' 1"  (1.549 m), weight 114 lb (51.71 kg). Body mass index is 21.55 kg/(m^2).  General appearance: alert, no distress, WD/WN,  female HEENT: normocephalic, sclerae anicteric, TMs pearly, nares patent, no discharge or erythema, pharynx normal Oral cavity: MMM, no lesions Neck: supple, no lymphadenopathy, no thyromegaly, no masses Heart: RRR, normal S1, S2, no murmurs Lungs: CTA bilaterally, no wheezes, rhonchi, or rales Abdomen: +bs, soft, non tender, non distended, no masses, no hepatomegaly, no splenomegaly Musculoskeletal: nontender, no swelling, no obvious deformity Extremities: no edema, no cyanosis, no clubbing Pulses: 2+ symmetric, upper and lower extremities, normal cap refill Neurological: alert, oriented x 3, CN2-12 intact,  strength normal upper extremities and lower extremities, sensation normal throughout, DTRs 2+ throughout, no cerebellar signs, gait normal Psychiatric: normal affect, behavior normal, pleasant  Breast: defer Gyn: defer Rectal exam: negative without mass, lesions or tenderness, internal hemorrhoids noted, sphincter tone normal, stool guaiac negative.  Medicare Attestation I have personally reviewed: The patient's medical and social history Their use of alcohol, tobacco or illicit drugs Their current  medications and supplements The patient's functional ability including ADLs,fall risks, home safety risks, cognitive, and hearing and visual impairment Diet and physical activities Evidence for depression or mood disorders  The patient's weight, height, BMI, and visual acuity have been recorded in the chart.  I have made referrals, counseling, and provided education to the patient based on review of the above and I have provided the patient with a written personalized care plan for preventive services.     Vicie Mutters, PA-C   07/04/2014

## 2014-08-04 ENCOUNTER — Other Ambulatory Visit: Payer: Self-pay | Admitting: Internal Medicine

## 2014-09-08 ENCOUNTER — Encounter: Payer: Self-pay | Admitting: Physician Assistant

## 2014-09-08 ENCOUNTER — Ambulatory Visit (INDEPENDENT_AMBULATORY_CARE_PROVIDER_SITE_OTHER): Payer: Medicare Other | Admitting: Physician Assistant

## 2014-09-08 VITALS — BP 110/70 | HR 88 | Temp 97.7°F | Resp 16 | Ht 61.0 in | Wt 114.0 lb

## 2014-09-08 DIAGNOSIS — K219 Gastro-esophageal reflux disease without esophagitis: Secondary | ICD-10-CM

## 2014-09-08 DIAGNOSIS — R7309 Other abnormal glucose: Secondary | ICD-10-CM

## 2014-09-08 DIAGNOSIS — E7800 Pure hypercholesterolemia, unspecified: Secondary | ICD-10-CM

## 2014-09-08 DIAGNOSIS — F324 Major depressive disorder, single episode, in partial remission: Secondary | ICD-10-CM

## 2014-09-08 DIAGNOSIS — R350 Frequency of micturition: Secondary | ICD-10-CM

## 2014-09-08 DIAGNOSIS — Z789 Other specified health status: Secondary | ICD-10-CM

## 2014-09-08 DIAGNOSIS — Z9181 History of falling: Secondary | ICD-10-CM

## 2014-09-08 DIAGNOSIS — D649 Anemia, unspecified: Secondary | ICD-10-CM

## 2014-09-08 DIAGNOSIS — F325 Major depressive disorder, single episode, in full remission: Secondary | ICD-10-CM

## 2014-09-08 DIAGNOSIS — M8580 Other specified disorders of bone density and structure, unspecified site: Secondary | ICD-10-CM | POA: Insufficient documentation

## 2014-09-08 DIAGNOSIS — E559 Vitamin D deficiency, unspecified: Secondary | ICD-10-CM

## 2014-09-08 DIAGNOSIS — I10 Essential (primary) hypertension: Secondary | ICD-10-CM

## 2014-09-08 DIAGNOSIS — Z79899 Other long term (current) drug therapy: Secondary | ICD-10-CM

## 2014-09-08 DIAGNOSIS — E042 Nontoxic multinodular goiter: Secondary | ICD-10-CM

## 2014-09-08 DIAGNOSIS — G4700 Insomnia, unspecified: Secondary | ICD-10-CM

## 2014-09-08 LAB — CBC WITH DIFFERENTIAL/PLATELET
BASOS PCT: 0 % (ref 0–1)
Basophils Absolute: 0 10*3/uL (ref 0.0–0.1)
Eosinophils Absolute: 0.1 10*3/uL (ref 0.0–0.7)
Eosinophils Relative: 1 % (ref 0–5)
HEMATOCRIT: 39.4 % (ref 36.0–46.0)
Hemoglobin: 13.5 g/dL (ref 12.0–15.0)
LYMPHS ABS: 2.3 10*3/uL (ref 0.7–4.0)
LYMPHS PCT: 38 % (ref 12–46)
MCH: 32 pg (ref 26.0–34.0)
MCHC: 34.3 g/dL (ref 30.0–36.0)
MCV: 93.4 fL (ref 78.0–100.0)
MPV: 9.1 fL (ref 8.6–12.4)
Monocytes Absolute: 0.4 10*3/uL (ref 0.1–1.0)
Monocytes Relative: 7 % (ref 3–12)
Neutro Abs: 3.2 10*3/uL (ref 1.7–7.7)
Neutrophils Relative %: 54 % (ref 43–77)
Platelets: 325 10*3/uL (ref 150–400)
RBC: 4.22 MIL/uL (ref 3.87–5.11)
RDW: 13.5 % (ref 11.5–15.5)
WBC: 6 10*3/uL (ref 4.0–10.5)

## 2014-09-08 LAB — BASIC METABOLIC PANEL WITH GFR
BUN: 13 mg/dL (ref 6–23)
CALCIUM: 9.4 mg/dL (ref 8.4–10.5)
CHLORIDE: 102 meq/L (ref 96–112)
CO2: 25 mEq/L (ref 19–32)
Creat: 0.7 mg/dL (ref 0.50–1.10)
GFR, Est Non African American: >89 mL/min
GLUCOSE: 85 mg/dL (ref 70–99)
POTASSIUM: 4.1 meq/L (ref 3.5–5.3)
SODIUM: 138 meq/L (ref 135–145)

## 2014-09-08 LAB — LIPID PANEL
CHOL/HDL RATIO: 4.6 ratio
Cholesterol: 203 mg/dL — ABNORMAL HIGH (ref 0–200)
HDL: 44 mg/dL — ABNORMAL LOW (ref >=46)
LDL CALC: 133 mg/dL — AB (ref 0–99)
TRIGLYCERIDES: 132 mg/dL (ref <150)
VLDL: 26 mg/dL (ref 0–40)

## 2014-09-08 LAB — HEPATIC FUNCTION PANEL
ALT: 21 U/L (ref 0–35)
AST: 21 U/L (ref 0–37)
Albumin: 4.3 g/dL (ref 3.5–5.2)
Alkaline Phosphatase: 84 U/L (ref 39–117)
Bilirubin, Direct: 0.1 mg/dL (ref 0.0–0.3)
Indirect Bilirubin: 0.4 mg/dL (ref 0.2–1.2)
TOTAL PROTEIN: 6.7 g/dL (ref 6.0–8.3)
Total Bilirubin: 0.5 mg/dL (ref 0.2–1.2)

## 2014-09-08 LAB — FERRITIN: FERRITIN: 101 ng/mL (ref 10–291)

## 2014-09-08 LAB — IRON AND TIBC
%SAT: 30 % (ref 20–55)
IRON: 113 ug/dL (ref 42–145)
TIBC: 379 ug/dL (ref 250–470)
UIBC: 266 ug/dL (ref 125–400)

## 2014-09-08 LAB — VITAMIN B12: Vitamin B-12: 610 pg/mL (ref 211–911)

## 2014-09-08 LAB — TSH: TSH: 2.173 u[IU]/mL (ref 0.350–4.500)

## 2014-09-08 LAB — MAGNESIUM: MAGNESIUM: 2 mg/dL (ref 1.5–2.5)

## 2014-09-08 NOTE — Patient Instructions (Addendum)
Multinodular thyroid goiter or Multinodular thyroid gland.  Goiter means thyroid enlargement so if your thyroid is enlarged it is multinodular thyroid goiter or otherwise it is called multinodular thyroid gland.  It is very common, in more than 10 % of the population. That is about 30,000 people just here in [place]. The great majority are completely benign. We do not recommend biopsy until the nodules reach 47mm. Otherwise we monitor your thyroid with an ultrasound and repeated blood work monitoring its function. If they nodules are not increasing in size, we often stop monitoring with ultrasound.   Benefiber is good for constipation/diarrhea/irritable bowel syndrome, it helps with weight loss and can help lower your bad cholesterol. Please do 1-2 TBSP in the morning in water, coffee, or tea. It can take up to a month before you can see a difference with your bowel movements. It is cheapest from [employer], [employer], [employer].   Your LDL is not in range, 05/04/2014: LDL (calc) 166*. Your LDL is the bad cholesterol that can lead to heart attack and stroke. To lower your number you can decrease your fatty foods, red meat, cheese, milk and increase fiber like whole grains and veggies. You can also add a fiber supplement like Metamucil or Benefiber.   Suggest low dose statin if your cholesterol is still not at goal.   Add ENTERIC COATED low dose 81 mg Aspirin daily OR can do every other day if you have easy bruising to protect your heart and head. As well as to reduce risk of Colon Cancer by 20 %, Skin Cancer by 26 % , Melanoma by 46% and Pancreatic cancer by 60%  Will get DEXA with MGM  About Cystocele  Overview  The pelvic organs, including the bladder, are normally supported by pelvic floor muscles and ligaments.  When these muscles and ligaments are stretched, weakened or torn, the wall between the bladder and the vagina sags or herniates causing a prolapse, sometimes called a cystocele.  This  condition may cause discomfort and problems with emptying the bladder.  It can be present in various stages.  Some people are not aware of the changes.  Others may notice changes at the vaginal opening or a feeling of the bladder dropping outside the body.  Causes of a Cystocele  A cystocele is usually caused by muscle straining or stretching during childbirth.  In addition, cystocele is more common after menopause, because the hormone estrogen helps keep the elastic tissues around the pelvic organs strong.  A cystocele is more likely to occur when levels of estrogen decrease.  Other causes include: heavy lifting, chronic coughing, previous pelvic surgery and obesity.  Symptoms  A bladder that has dropped from its normal position may cause: unwanted urine leakage (stress incontinence), frequent urination or urge to urinate, incomplete emptying of the bladder (not feeling bladder relief after emptying), pain or discomfort in the vagina, pelvis, groin, lower back or lower abdomen and frequent urinary tract infections.  Mild cases may not cause any symptoms.  Treatment Options  Pelvic floor (Kegel) exercises:  Strength training the muscles in your genital area  Behavioral changes: Treating and preventing constipation, taking time to empty your bladder properly, learning to lift properly and/or avoid heavy lifting when possible, stopping smoking, avoiding weight gain and treating a chronic cough or bronchitis.  A pessary: A vaginal support device is sometimes used to help pelvic support caused by muscle and ligament changes.  Surgery: Surgical repair may be necessary if symptoms cannot be  managed with exercise, behavioral changes and a pessary.  Surgery is usually considered for severe cases.   2007, [employer]

## 2014-09-08 NOTE — Progress Notes (Signed)
Complete Physical  Assessment and Plan: 1. Essential hypertension - continue medications, DASH diet, exercise and monitor at home. Call if greater than 130/80.  - CBC with Differential/Platelet - BASIC METABOLIC PANEL WITH GFR - Hepatic function panel - TSH - Urinalysis, Routine w reflex microscopic (not at [facility]) - Microalbumin / creatinine urine ratio - EKG 12-Lead  2. HYPERCHOLESTEROLEMIA -continue medications, check lipids, decrease fatty foods, increase activity. - Lipid panel  3. Abnormal glucose Discussed general issues about diabetes pathophysiology and management., Educational material distributed., Suggested low cholesterol diet., Encouraged aerobic exercise., Discussed foot care., Reminded to get yearly retinal exam.  4. Gastroesophageal reflux disease without esophagitis Continue PPI/H2 blocker, diet discussed  5. Major depression in remission Remission, off meds  6. Insomnia Continue xanax PRN  7. Vitamin D deficiency - Vit D  25 hydroxy (rtn osteoporosis monitoring)  8. Medication management - Magnesium  9. Patient had no falls in past year Low risk  10. Osteopenia - DG Bone Density; Future  11. Anemia, unspecified anemia type - Ferritin - Iron and TIBC - Vitamin B12  12. Urinary frequency DDX UTI, ICS, cystocele/rectocele, vaginal atrophy - Urine culture - + cystocele - may try amitriptyline if negatve  13. Multiple thyroid nodules - US Soft Tissue Head/Neck; Future  Discussed med's effects and SE's. Screening labs and tests as requested with regular follow-up as recommended. Over 40 minutes of exam, counseling, chart review, and critical decision making was performed this visit.   HPI  69 y.o. female  presents for a complete physical.  Her blood pressure has been controlled at home, today their BP is BP: 110/70 mmHg She does workout. She denies chest pain, shortness of breath, dizziness.  She is not on cholesterol medication and denies  myalgias. Her cholesterol is not at goal. The cholesterol last visit was:   Lab Results  Component Value Date   CHOL 240* 05/04/2014   HDL 52 05/04/2014   LDLCALC 166* 05/04/2014   TRIG 109 05/04/2014   CHOLHDL 4.6 05/04/2014   She has been working on diet and exercise for prediabetes, Last A1C in the office was:  Lab Results  Component Value Date   HGBA1C 5.5 05/04/2014   Patient is on Vitamin D supplement.   Lab Results  Component Value Date   VD25OH 51 05/04/2014     She is off celexa for depression and is doing well.  She uses the xanax at night for insomnia, not during the day.  CXR 01/2013- lungs clear, quit smoking with chantix in 2013 but started again recently, started back on chantix in past 2 months.   She has not had anymore abnormal stools/stool colors.  She states it feels like she has pressure around her urethra, has been having frequency/pressure/urgency will have very little urine at time for 3-5 days. Had dark urine today, feels nothing falling out, denies vaginal dryness/discharge/fever, chills. Normal cystoscopy in 2013 with Dr. Jeffie Pollock.   Current Medications:  Current Outpatient Prescriptions on File Prior to Visit  Medication Sig Dispense Refill  . ALPRAZolam (XANAX) 0.5 MG tablet TAKE 1/2 TO 1 TABLET BY MOUTH THREE TIMES A DAY FOR ANXIETY OR SLEEP 90 tablet 1  . Cholecalciferol (VITAMIN D) 400 UNITS capsule Take by mouth daily.     . Flax OIL Take by mouth.      Marland Kitchen NIACIN CR PO Take by mouth daily.     No current facility-administered medications on file prior to visit.   Health Maintenance:  Last colonoscopy: 10/2012 Last mammogram: 10/2013 Last pap smear/pelvic exam: 2011 remote, declines another DEXA: 10/2012- osteopenia due this year Ct chest 2009  Prior vaccinations: TD or Tdap: 1999 and declines Influenza: declines Pneumococcal: 2000 declines Prevnar 13 declines Shingles/Zostavax: will check price  Names of Other  Physician/Practitioners you currently use: 1. [facility]- here for primary care 2. [facility], eye doctor, last visit 07/2014 3. Dr. Carlis Abbott, dentist, last visit 07/2014, and has appointment 29th of June Patient Care Team: Patient Care Team: Unk Pinto, MD as PCP - General (Internal Medicine) Inda Castle, MD as Consulting Physician (Gastroenterology) Irine Seal, MD as Attending Physician (Urology) Crista Luria, MD as Consulting Physician (Dermatology) Chauncey Cruel, MD as Consulting Physician (Oncology)  Allergies:  Allergies  Allergen Reactions  . Codeine   . Decongestant [Pseudoephedrine Hcl]    Medical History:  Past Medical History  Diagnosis Date  . Unspecified essential hypertension   . Pure hypercholesterolemia   . Insomnia, unspecified   . Depressive disorder, not elsewhere classified   . Breast cancer     left   . Anxiety   . GERD (gastroesophageal reflux disease)   . IBS (irritable bowel syndrome)   . Hx of colonic polyps 2003    Colonoscopy   . Esophagitis, unspecified 2001    EGD  . Stricture and stenosis of esophagus 2001, 2007    EGD  . Diverticulosis of colon (without mention of hemorrhage) 2007    Colonoscopy  . Internal hemorrhoids without mention of complication 3762    Colonoscopy    Surgical History:  Past Surgical History  Procedure Laterality Date  . Abdominal hysterectomy    . Cholecystectomy  2004  . Breast lumpectomy  2005    left  . Nasal sinus surgery     Family History:  Family History  Problem Relation Age of Onset  . Bipolar disorder Mother   . Breast cancer Mother   . Heart disease Mother   . Colon cancer Neg Hx   . Heart disease Father   . Parkinson's disease Sister    Social History:  History  Substance Use Topics  . Smoking status: Former Smoker -- 1.00 packs/day for 35 years    Quit date: 03/28/2012  . Smokeless tobacco: Never Used     Comment: Counseling sheet given in  exam room   . Alcohol Use: 0.6 oz/week    1 Glasses of wine per week   Review of Systems: Review of Systems  Constitutional: Negative.   HENT: Positive for sore throat. Negative for congestion, ear discharge, ear pain, hearing loss, nosebleeds and tinnitus.   Eyes: Negative.   Respiratory: Negative.  Negative for stridor.   Cardiovascular: Negative.   Gastrointestinal: Negative.   Genitourinary: Positive for urgency and frequency. Negative for dysuria, hematuria and flank pain.  Musculoskeletal: Negative.   Skin: Negative.   Neurological: Negative.  Negative for headaches.  Endo/Heme/Allergies: Negative.   Psychiatric/Behavioral: Negative.     Physical Exam: Estimated body mass index is 21.55 kg/(m^2) as calculated from the following:   Height as of 07/04/14: 5\' 1"  (1.549 m).   Weight as of this encounter: 114 lb (51.71 kg). BP 110/70 mmHg  Pulse 88  Temp(Src) 97.7 F (36.5 C)  Resp 16  Wt 114 lb (51.71 kg) General Appearance: Well nourished, in no apparent distress.  Eyes: PERRLA, EOMs, conjunctiva no swelling or erythema, normal fundi and vessels.  Sinuses: No Frontal/maxillary tenderness  ENT/Mouth: Ext aud  canals clear, normal light reflex with TMs without erythema, bulging. Good dentition. No erythema, swelling, or exudate on post pharynx. Tonsils not swollen or erythematous. Hearing normal.  Neck: Supple, thyroid slightly enlarged on right side with multiple bilateral nodules. No bruits  Respiratory: Respiratory effort normal, BS equal bilaterally without rales, rhonchi, wheezing or stridor.  Cardio: RRR without murmurs, rubs or gallops. Brisk peripheral pulses without edema.  Chest: symmetric, with normal excursions and percussion.  Breasts: + scar tissue on left upper outer quadrant from previous lumpectomy, without lumps, nipple discharge, retractions.  Abdomen: Soft, nontender, no guarding, rebound, hernias, masses, or organomegaly.  Lymphatics: Non tender without  lymphadenopathy.  Genitourinary: VAGINA: atrophic, PELVIC FLOOR EXAM: cystocele. Musculoskeletal: Full ROM all peripheral extremities,5/5 strength, and normal gait.  Skin: Warm, dry without rashes, lesions, ecchymosis. Neuro: Cranial nerves intact, reflexes equal bilaterally. Normal muscle tone, no cerebellar symptoms. Sensation intact.  Psych: Awake and oriented X 3, normal affect, Insight and Judgment appropriate.   EKG: WNL no changes.  Vicie Mutters 2:02 PM [facility]

## 2014-09-09 ENCOUNTER — Other Ambulatory Visit: Payer: Self-pay

## 2014-09-09 LAB — URINALYSIS, MICROSCOPIC ONLY
Bacteria, UA: NONE SEEN
Crystals: NONE SEEN

## 2014-09-09 LAB — URINALYSIS, ROUTINE W REFLEX MICROSCOPIC
GLUCOSE, UA: NEGATIVE mg/dL
HGB URINE DIPSTICK: NEGATIVE
Nitrite: NEGATIVE
Protein, ur: NEGATIVE mg/dL
Specific Gravity, Urine: 1.023 (ref 1.005–1.030)
Urobilinogen, UA: 0.2 mg/dL (ref 0.0–1.0)
pH: 5 (ref 5.0–8.0)

## 2014-09-09 LAB — MICROALBUMIN / CREATININE URINE RATIO
Creatinine, Urine: 366.9 mg/dL
Microalb Creat Ratio: 5.5 mg/g (ref 0.0–30.0)
Microalb, Ur: 2 mg/dL (ref <2.0)

## 2014-09-09 LAB — VITAMIN D 25 HYDROXY (VIT D DEFICIENCY, FRACTURES): Vit D, 25-Hydroxy: 53 ng/mL (ref 30–100)

## 2014-09-10 LAB — URINE CULTURE: Colony Count: 40000

## 2014-09-12 ENCOUNTER — Other Ambulatory Visit: Payer: Self-pay

## 2014-09-12 ENCOUNTER — Other Ambulatory Visit: Payer: Self-pay | Admitting: Physician Assistant

## 2014-09-12 DIAGNOSIS — Z1231 Encounter for screening mammogram for malignant neoplasm of breast: Secondary | ICD-10-CM

## 2014-09-13 ENCOUNTER — Other Ambulatory Visit: Payer: Self-pay | Admitting: Physician Assistant

## 2014-09-13 DIAGNOSIS — Z1231 Encounter for screening mammogram for malignant neoplasm of breast: Secondary | ICD-10-CM

## 2014-09-15 ENCOUNTER — Ambulatory Visit
Admission: RE | Admit: 2014-09-15 | Discharge: 2014-09-15 | Disposition: A | Discharge status: Home / Self Care | Payer: Medicare Other | Source: Ambulatory Visit | Attending: Physician Assistant | Admitting: Physician Assistant

## 2014-09-15 DIAGNOSIS — E042 Nontoxic multinodular goiter: Secondary | ICD-10-CM

## 2014-09-29 ENCOUNTER — Encounter: Payer: Self-pay | Admitting: Gastroenterology

## 2014-10-03 ENCOUNTER — Other Ambulatory Visit: Payer: Self-pay

## 2014-10-18 ENCOUNTER — Ambulatory Visit: Payer: Self-pay

## 2014-10-18 ENCOUNTER — Other Ambulatory Visit: Payer: Self-pay

## 2014-10-19 ENCOUNTER — Ambulatory Visit
Admission: RE | Admit: 2014-10-19 | Discharge: 2014-10-19 | Disposition: A | Discharge status: Home / Self Care | Payer: Medicare Other | Source: Ambulatory Visit | Attending: Physician Assistant | Admitting: Physician Assistant

## 2014-10-19 DIAGNOSIS — M8580 Other specified disorders of bone density and structure, unspecified site: Secondary | ICD-10-CM

## 2014-10-19 DIAGNOSIS — Z1231 Encounter for screening mammogram for malignant neoplasm of breast: Secondary | ICD-10-CM

## 2014-10-24 ENCOUNTER — Encounter: Payer: Self-pay | Admitting: Physician Assistant

## 2014-10-24 DIAGNOSIS — Z0000 Encounter for general adult medical examination without abnormal findings: Secondary | ICD-10-CM | POA: Insufficient documentation

## 2014-10-31 ENCOUNTER — Other Ambulatory Visit: Payer: Self-pay | Admitting: Physician Assistant

## 2014-11-21 ENCOUNTER — Other Ambulatory Visit: Payer: Self-pay | Admitting: Physician Assistant

## 2014-11-21 ENCOUNTER — Other Ambulatory Visit: Payer: Self-pay

## 2014-11-21 MED ORDER — CITALOPRAM HYDROBROMIDE 40 MG PO TABS: ORAL_TABLET | ORAL | Status: DC

## 2014-12-14 ENCOUNTER — Ambulatory Visit: Payer: Self-pay | Admitting: Physician Assistant

## 2015-02-08 ENCOUNTER — Encounter: Payer: Self-pay | Admitting: Physician Assistant

## 2015-02-08 ENCOUNTER — Ambulatory Visit (INDEPENDENT_AMBULATORY_CARE_PROVIDER_SITE_OTHER): Payer: Medicare Other | Admitting: Physician Assistant

## 2015-02-08 VITALS — BP 100/60 | HR 77 | Temp 97.3°F | Resp 16 | Ht 61.0 in | Wt 110.0 lb

## 2015-02-08 DIAGNOSIS — Z79899 Other long term (current) drug therapy: Secondary | ICD-10-CM | POA: Diagnosis not present

## 2015-02-08 DIAGNOSIS — Z1389 Encounter for screening for other disorder: Secondary | ICD-10-CM

## 2015-02-08 DIAGNOSIS — R7309 Other abnormal glucose: Secondary | ICD-10-CM

## 2015-02-08 DIAGNOSIS — E559 Vitamin D deficiency, unspecified: Secondary | ICD-10-CM

## 2015-02-08 DIAGNOSIS — I10 Essential (primary) hypertension: Secondary | ICD-10-CM | POA: Diagnosis not present

## 2015-02-08 DIAGNOSIS — Z1331 Encounter for screening for depression: Secondary | ICD-10-CM

## 2015-02-08 DIAGNOSIS — E7800 Pure hypercholesterolemia, unspecified: Secondary | ICD-10-CM | POA: Diagnosis not present

## 2015-02-08 LAB — CBC WITH DIFFERENTIAL/PLATELET
BASOS PCT: 0 % (ref 0–1)
Basophils Absolute: 0 10*3/uL (ref 0.0–0.1)
EOS ABS: 0.1 10*3/uL (ref 0.0–0.7)
EOS PCT: 1 % (ref 0–5)
HCT: 40.5 % (ref 36.0–46.0)
Hemoglobin: 13.5 g/dL (ref 12.0–15.0)
LYMPHS ABS: 1.4 10*3/uL (ref 0.7–4.0)
Lymphocytes Relative: 28 % (ref 12–46)
MCH: 31.9 pg (ref 26.0–34.0)
MCHC: 33.3 g/dL (ref 30.0–36.0)
MCV: 95.7 fL (ref 78.0–100.0)
MONOS PCT: 6 % (ref 3–12)
MPV: 9.4 fL (ref 8.6–12.4)
Monocytes Absolute: 0.3 10*3/uL (ref 0.1–1.0)
Neutro Abs: 3.3 10*3/uL (ref 1.7–7.7)
Neutrophils Relative %: 65 % (ref 43–77)
PLATELETS: 297 10*3/uL (ref 150–400)
RBC: 4.23 MIL/uL (ref 3.87–5.11)
RDW: 13.5 % (ref 11.5–15.5)
WBC: 5.1 10*3/uL (ref 4.0–10.5)

## 2015-02-08 LAB — LIPID PANEL
Cholesterol: 217 mg/dL — ABNORMAL HIGH (ref 125–200)
HDL: 50 mg/dL (ref >=46)
LDL Cholesterol: 138 mg/dL — ABNORMAL HIGH (ref <130)
TRIGLYCERIDES: 147 mg/dL (ref <150)
Total CHOL/HDL Ratio: 4.3 Ratio (ref <=5.0)
VLDL: 29 mg/dL (ref <30)

## 2015-02-08 LAB — HEPATIC FUNCTION PANEL
ALBUMIN: 4.2 g/dL (ref 3.6–5.1)
ALT: 10 U/L (ref 6–29)
AST: 15 U/L (ref 10–35)
Alkaline Phosphatase: 87 U/L (ref 33–130)
BILIRUBIN TOTAL: 0.5 mg/dL (ref 0.2–1.2)
Bilirubin, Direct: 0.1 mg/dL (ref <=0.2)
Indirect Bilirubin: 0.4 mg/dL (ref 0.2–1.2)
Total Protein: 6.3 g/dL (ref 6.1–8.1)

## 2015-02-08 LAB — BASIC METABOLIC PANEL WITH GFR
BUN: 10 mg/dL (ref 7–25)
CO2: 28 mmol/L (ref 20–31)
CREATININE: 0.63 mg/dL (ref 0.50–0.99)
Calcium: 9.2 mg/dL (ref 8.6–10.4)
Chloride: 104 mmol/L (ref 98–110)
GFR, Est Non African American: >89 mL/min (ref >=60)
Glucose, Bld: 75 mg/dL (ref 65–99)
Potassium: 4 mmol/L (ref 3.5–5.3)
SODIUM: 141 mmol/L (ref 135–146)

## 2015-02-08 LAB — MAGNESIUM: Magnesium: 2 mg/dL (ref 1.5–2.5)

## 2015-02-08 MED ORDER — AMITRIPTYLINE HCL 25 MG PO TABS: ORAL_TABLET | ORAL | Status: DC

## 2015-02-08 NOTE — Progress Notes (Signed)
Assessment and Plan:  1. Hypertension -Continue medication, monitor blood pressure at home. Continue DASH diet.  Reminder to go to the ER if any CP, SOB, nausea, dizziness, severe HA, changes vision/speech, left arm numbness and tingling and jaw pain.  2. Cholesterol -Continue diet and exercise. Check cholesterol.   3. Prediabetes  -Continue diet and exercise. Check A1C  4. Vitamin D Def - check level and continue medications.   5. ICS amitriptyline PRN  6. Depression screen negative   Continue diet and meds as discussed. Further disposition pending results of labs. Over 30 minutes of exam, counseling, chart review, and critical decision making was performed  Future Appointments Date Time Provider [place]  09/12/2015 2:00 PM Vicie Mutters, PA-C [id number] None     HPI 69 y.o. female  presents for 3 month follow up on hypertension, cholesterol, prediabetes, and vitamin D deficiency.   Her blood pressure has been controlled at home, today their BP is BP: 100/60 mmHg  She does workout. She denies chest pain, shortness of breath, dizziness.  She is not on cholesterol medication and denies myalgias. Her cholesterol is at goal. The cholesterol last visit was:   Lab Results  Component Value Date   CHOL 203* 09/08/2014   HDL 44* 09/08/2014   LDLCALC 133* 09/08/2014   TRIG 132 09/08/2014   CHOLHDL 4.6 09/08/2014    She has been working on diet and exercise for prediabetes, and denies paresthesia of the feet, polydipsia, polyuria and visual disturbances. Last A1C in the office was:  Lab Results  Component Value Date   HGBA1C 5.5 05/04/2014   Patient is on Vitamin D supplement.   Lab Results  Component Value Date   VD25OH 53 09/08/2014      Current Medications:  Current Outpatient Prescriptions on File Prior to Visit  Medication Sig Dispense Refill  . ALPRAZolam (XANAX) 0.5 MG tablet Take 1 tablet (0.5 mg total) by mouth 3 (three) times daily as needed for  anxiety. 90 tablet 1  . Cholecalciferol (VITAMIN D) 400 UNITS capsule Take by mouth daily.     . citalopram (CELEXA) 40 MG tablet 1/2-1 pill daily 90 tablet 0  . Flax OIL Take by mouth.      Marland Kitchen NIACIN CR PO Take by mouth daily.     No current facility-administered medications on file prior to visit.   Medical History:  Past Medical History  Diagnosis Date  . Unspecified essential hypertension   . Pure hypercholesterolemia   . Insomnia, unspecified   . Depressive disorder, not elsewhere classified   . Breast cancer ([place])     left   . Anxiety   . GERD (gastroesophageal reflux disease)   . IBS (irritable bowel syndrome)   . Hx of colonic polyps 2003    Colonoscopy   . Esophagitis, unspecified 2001    EGD  . Stricture and stenosis of esophagus 2001, 2007    EGD  . Diverticulosis of colon (without mention of hemorrhage) 2007    Colonoscopy  . Internal hemorrhoids without mention of complication 8003    Colonoscopy    Allergies:  Allergies  Allergen Reactions  . Codeine   . Decongestant [Pseudoephedrine Hcl]      Review of Systems:  ROS  Family history- Review and unchanged Social history- Review and unchanged Physical Exam: BP 100/60 mmHg  Pulse 77  Temp(Src) 97.3 F (36.3 C) (Temporal)  Resp 16  Ht 5\' 1"  (1.549 m)  Wt 110 lb (49.896 kg)  BMI 20.80 kg/m2  SpO2 98% Wt Readings from Last 3 Encounters:  02/08/15 110 lb (49.896 kg)  09/08/14 114 lb (51.71 kg)  07/04/14 114 lb (51.71 kg)   General Appearance: Well nourished, in no apparent distress. Eyes: PERRLA, EOMs, conjunctiva no swelling or erythema Sinuses: No Frontal/maxillary tenderness ENT/Mouth: Ext aud canals clear, TMs without erythema, bulging. No erythema, swelling, or exudate on post pharynx.  Tonsils not swollen or erythematous. Hearing normal.  Neck: Supple, thyroid normal.  Respiratory: Respiratory effort normal, BS equal bilaterally without rales, rhonchi, wheezing or stridor.  Cardio: RRR with  no MRGs. Brisk peripheral pulses without edema.  Abdomen: Soft, + BS,  Non tender, no guarding, rebound, hernias, masses. Lymphatics: Non tender without lymphadenopathy.  Musculoskeletal: Full ROM, 5/5 strength, Normal gait Skin: Warm, dry without rashes, lesions, ecchymosis.  Neuro: Cranial nerves intact. Normal muscle tone, no cerebellar symptoms. Psych: Awake and oriented X 3, normal affect, Insight and Judgment appropriate.    Vicie Mutters, PA-C 11:28 AM [facility]

## 2015-02-08 NOTE — Patient Instructions (Addendum)
Your ears and sinuses are connected by the eustachian tube. When your sinuses are inflamed, this can close off the tube and cause fluid to collect in your middle ear. This can then cause dizziness, popping, clicking, ringing, and echoing in your ears. This is often NOT an infection and does NOT require antibiotics, it is caused by inflammation so the treatments help the inflammation. This can take a long time to get better so please be patient.  Here are things you can do to help with this: - Try the Flonase or Nasonex. Remember to spray each nostril twice towards the outer part of your eye.  Do not sniff but instead pinch your nose and tilt your head back to help the medicine get into your sinuses.  The best time to do this is at bedtime.Stop if you get blurred vision or nose bleeds.  -While drinking fluids, pinch and hold nose close and swallow, to help open eustachian tubes to drain fluid behind ear drums. -Please pick one of the over the counter allergy medications below and take it once daily for allergies.  It will also help with fluid behind ear drums. Claritin or loratadine cheapest but likely the weakest  Zyrtec or certizine at night because it can make you sleepy The strongest is allegra or fexafinadine  Cheapest at [facility], [employer], [employer] -can use decongestant over the counter, please do not use if you have high blood pressure or certain heart conditions.   if worsening HA, changes vision/speech, imbalance, weakness go to the ER    Interstitial Cystitis Interstitial cystitis is a condition that causes inflammation of the bladder. The bladder is a hollow organ in the lower part of your abdomen. It stores urine after the urine is made by your kidneys. With interstitial cystitis, you may have pain in the bladder area. You may also have a frequent and urgent need to urinate. The severity of interstitial cystitis can vary from person to person. You may have flare-ups of the condition, and then  it may go away for a while. For many people who have this condition, it becomes a long-term problem. CAUSES The cause of this condition is not known. RISK FACTORS This condition is more likely to develop in women. SYMPTOMS Symptoms of interstitial cystitis vary, and they can change over time. Symptoms may include:  Discomfort or pain in the bladder area. This can range from mild to severe. The pain may change in intensity as the bladder fills with urine or as it empties.  Pelvic pain.  An urgent need to urinate.  Frequent urination.  Pain during sexual intercourse.  Pinpoint bleeding on the bladder wall. For women, the symptoms often get worse during menstruation. DIAGNOSIS This condition is diagnosed by evaluating your symptoms and ruling out other causes. A physical exam will be done. Various tests may be done to rule out other conditions. Common tests include:  Urine tests.  Cystoscopy. In this test, a tool that is like a very thin telescope is used to look into your bladder.  Biopsy. This involves taking a sample of tissue from the bladder wall to be examined under a microscope. TREATMENT There is no cure for interstitial cystitis, but eatment methods are available to control your symptoms. Work closely with your health care provider to find the treatments that will be most effective for you. Treatment options may include:  Medicines to relieve pain and to help reduce the number of times that you feel the need to urinate.  Bladder training. This involves learning ways to control when you urinate, such as:  Urinating at scheduled times.  Training yourself to delay urination.  Doing exercises (Kegel exercises) to strengthen the muscles that control urine flow.  Lifestyle changes, such as changing your diet or taking steps to control stress.  Use of a device that provides electrical stimulation in order to reduce pain.  A procedure that stretches your bladder by filling  it with air or fluid.  Surgery. This is rare. It is only done for extreme cases if other treatments do not help. HOME CARE INSTRUCTIONS  Take medicines only as directed by your health care provider.  Use bladder training techniques as directed.  Keep a bladder diary to find out which foods, liquids, or activities make your symptoms worse.  Use your bladder diary to schedule bathroom trips. If you are away from home, plan to be near a bathroom at each of your scheduled times.  Make sure you urinate just before you leave the house and just before you go to bed.  Do Kegel exercises as directed by your health care provider.  Do not drink alcohol.  Do not use any tobacco products, including cigarettes, chewing tobacco, or electronic cigarettes. If you need help quitting, ask your health care provider.  Make dietary changes as directed by your health care provider. You may need to avoid spicy foods and foods that contain a high amount of potassium.  Limit your drinking of beverages that stimulate urination. These include soda, coffee, and tea.  Keep all follow-up visits as directed by your health care provider. This is important. SEEK MEDICAL CARE IF:  Your symptoms do not get better after treatment.  Your pain and discomfort are getting worse.  You have more frequent urges to urinate.  You have a fever. SEEK IMMEDIATE MEDICAL CARE IF:  You are not able to control your bladder at all.   This information is not intended to replace advice given to you by your health care provider. Make sure you discuss any questions you have with your health care provider.   Document Released: 11/24/2003 Document Revised: 04/15/2014 Document Reviewed: 11/30/2013 Elsevier Interactive Patient Education [employer].

## 2015-02-09 LAB — VITAMIN D 25 HYDROXY (VIT D DEFICIENCY, FRACTURES): VIT D 25 HYDROXY: 51 ng/mL (ref 30–100)

## 2015-02-09 LAB — TSH: TSH: 0.985 u[IU]/mL (ref 0.350–4.500)

## 2015-02-24 ENCOUNTER — Other Ambulatory Visit: Payer: Self-pay | Admitting: Physician Assistant

## 2015-02-24 MED ORDER — ALPRAZOLAM 0.5 MG PO TABS: 0.5000 mg | ORAL_TABLET | Freq: Three times a day (TID) | ORAL | Status: DC | PRN

## 2015-02-24 NOTE — Progress Notes (Signed)
Rx was called into [facility] in [place]

## 2015-03-17 ENCOUNTER — Other Ambulatory Visit: Payer: Self-pay | Admitting: Physician Assistant

## 2015-05-02 ENCOUNTER — Other Ambulatory Visit: Payer: Self-pay | Admitting: Physician Assistant

## 2015-06-26 ENCOUNTER — Other Ambulatory Visit: Payer: Self-pay | Admitting: Physician Assistant

## 2015-06-27 IMAGING — CR DG CHEST 2V
2 series · 2 of 2 positions shown · non-contrast
Comparison: 01/11/2013

CLINICAL DATA: Smoking history.

EXAM:
CHEST  2 VIEW

[view not recorded (1 of 2)]
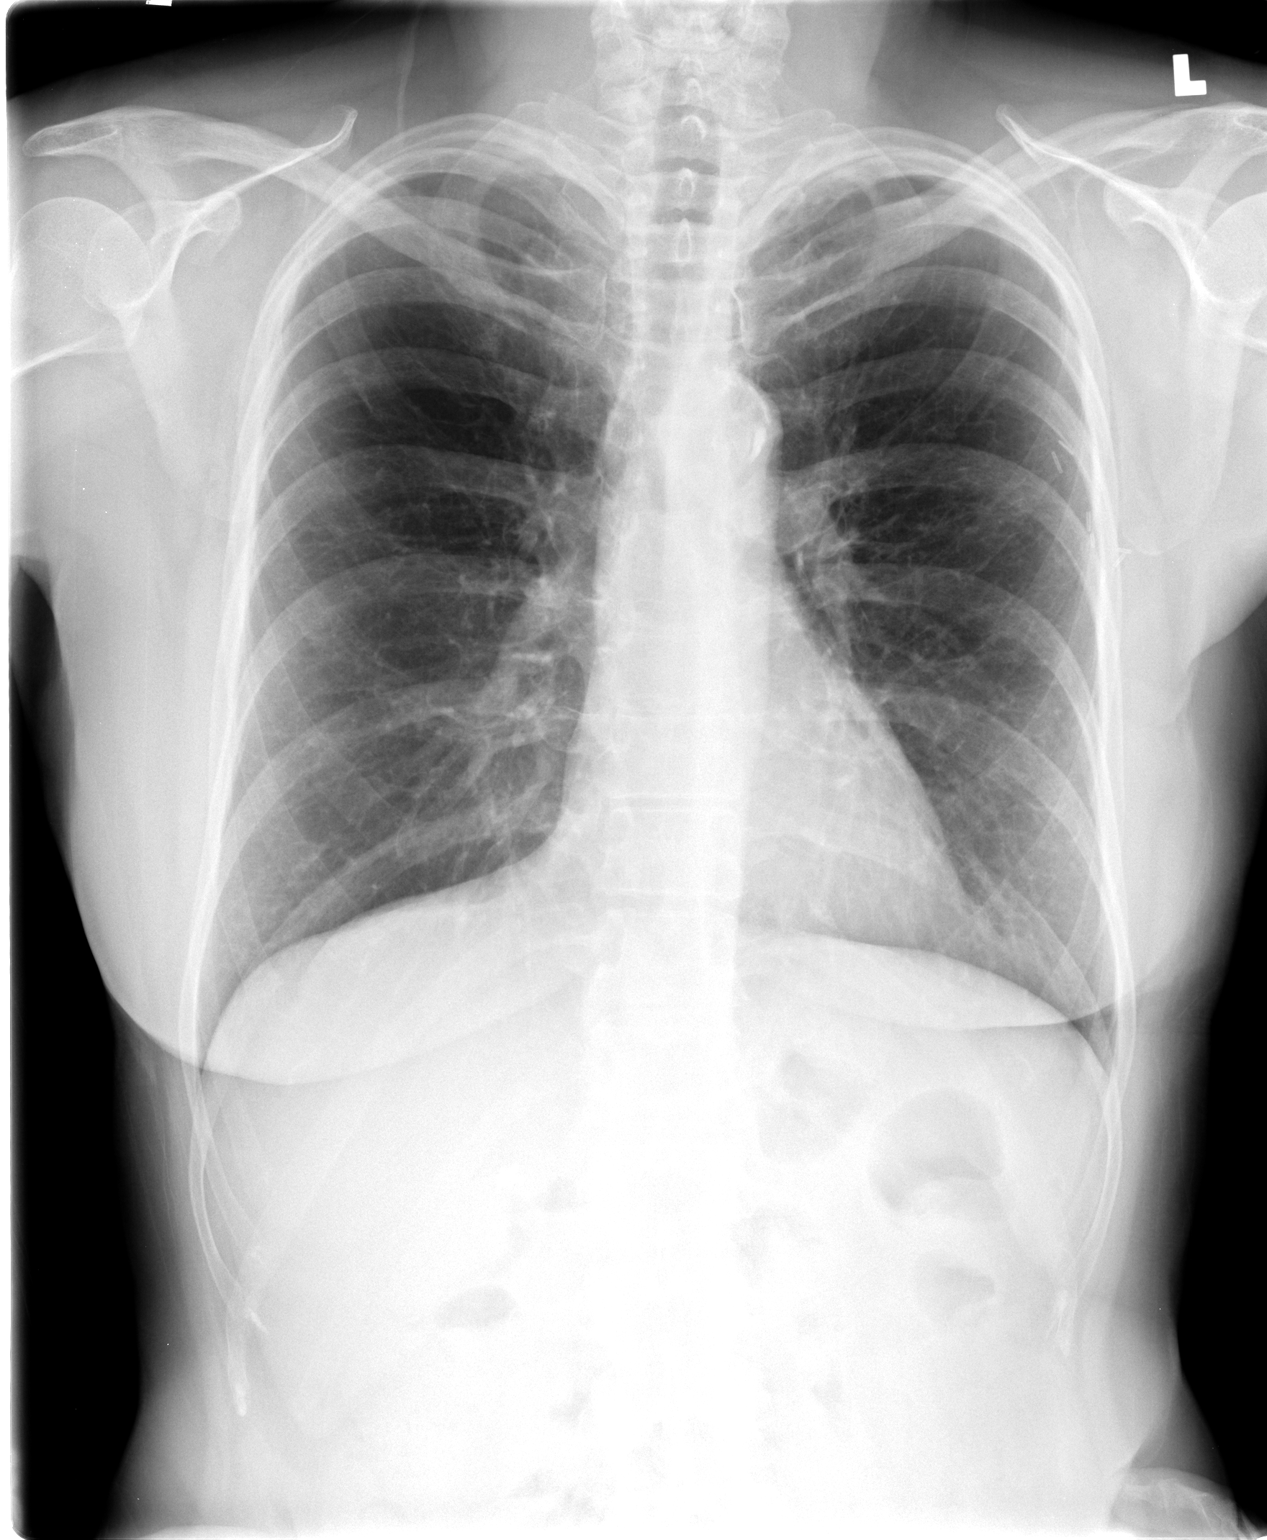

[view not recorded (2 of 2)]
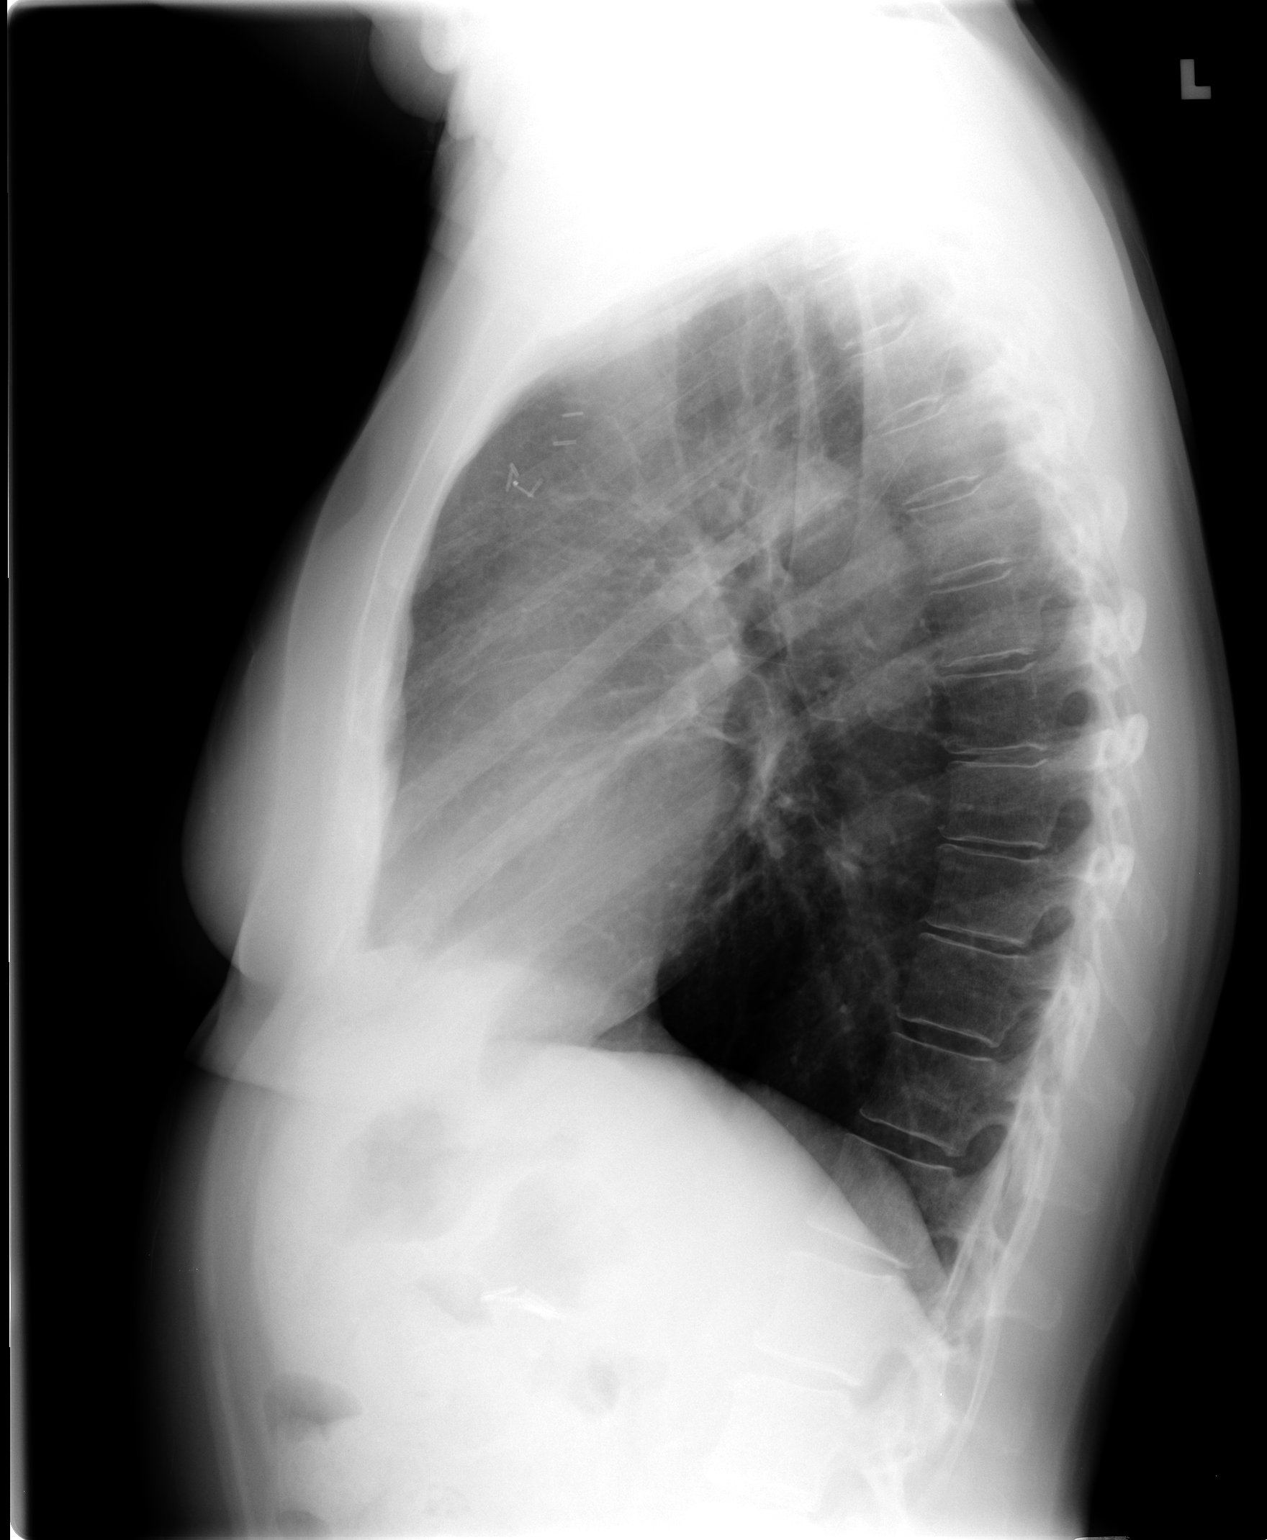

[2 of 2 positions shown; findings below may reference images not displayed]

FINDINGS: The heart size and mediastinal contours are within normal limits.
Both lungs are clear. The visualized skeletal structures are
unremarkable.
IMPRESSION: No active cardiopulmonary disease.

## 2015-08-08 ENCOUNTER — Other Ambulatory Visit: Payer: Self-pay | Admitting: Physician Assistant

## 2015-09-12 ENCOUNTER — Encounter: Payer: Self-pay | Admitting: Physician Assistant

## 2015-09-12 ENCOUNTER — Ambulatory Visit (INDEPENDENT_AMBULATORY_CARE_PROVIDER_SITE_OTHER): Payer: Medicare Other | Admitting: Physician Assistant

## 2015-09-12 ENCOUNTER — Ambulatory Visit (HOSPITAL_COMMUNITY)
Admission: RE | Admit: 2015-09-12 | Discharge: 2015-09-12 | Disposition: A | Discharge status: Home / Self Care | Payer: Medicare Other | Source: Ambulatory Visit | Attending: Physician Assistant | Admitting: Physician Assistant

## 2015-09-12 VITALS — BP 122/68 | HR 72 | Temp 98.0°F | Resp 16 | Ht 61.5 in | Wt 114.0 lb

## 2015-09-12 DIAGNOSIS — E559 Vitamin D deficiency, unspecified: Secondary | ICD-10-CM

## 2015-09-12 DIAGNOSIS — K219 Gastro-esophageal reflux disease without esophagitis: Secondary | ICD-10-CM

## 2015-09-12 DIAGNOSIS — F325 Major depressive disorder, single episode, in full remission: Secondary | ICD-10-CM

## 2015-09-12 DIAGNOSIS — R6889 Other general symptoms and signs: Secondary | ICD-10-CM | POA: Diagnosis not present

## 2015-09-12 DIAGNOSIS — Z79899 Other long term (current) drug therapy: Secondary | ICD-10-CM

## 2015-09-12 DIAGNOSIS — R7309 Other abnormal glucose: Secondary | ICD-10-CM

## 2015-09-12 DIAGNOSIS — Z0000 Encounter for general adult medical examination without abnormal findings: Secondary | ICD-10-CM

## 2015-09-12 DIAGNOSIS — E7800 Pure hypercholesterolemia, unspecified: Secondary | ICD-10-CM

## 2015-09-12 DIAGNOSIS — Z0001 Encounter for general adult medical examination with abnormal findings: Secondary | ICD-10-CM | POA: Insufficient documentation

## 2015-09-12 DIAGNOSIS — Z136 Encounter for screening for cardiovascular disorders: Secondary | ICD-10-CM | POA: Diagnosis not present

## 2015-09-12 DIAGNOSIS — R002 Palpitations: Secondary | ICD-10-CM

## 2015-09-12 DIAGNOSIS — Z720 Tobacco use: Secondary | ICD-10-CM | POA: Diagnosis not present

## 2015-09-12 DIAGNOSIS — I10 Essential (primary) hypertension: Secondary | ICD-10-CM

## 2015-09-12 DIAGNOSIS — Z87891 Personal history of nicotine dependence: Secondary | ICD-10-CM

## 2015-09-12 DIAGNOSIS — Z1159 Encounter for screening for other viral diseases: Secondary | ICD-10-CM

## 2015-09-12 DIAGNOSIS — G4700 Insomnia, unspecified: Secondary | ICD-10-CM

## 2015-09-12 DIAGNOSIS — M8580 Other specified disorders of bone density and structure, unspecified site: Secondary | ICD-10-CM

## 2015-09-12 LAB — CBC WITH DIFFERENTIAL/PLATELET
Basophils Absolute: 61 cells/uL (ref 0–200)
Basophils Relative: 1 %
Eosinophils Absolute: 61 cells/uL (ref 15–500)
Eosinophils Relative: 1 %
HEMATOCRIT: 41 % (ref 35.0–45.0)
Hemoglobin: 13.9 g/dL (ref 11.7–15.5)
LYMPHS ABS: 2135 {cells}/uL (ref 850–3900)
Lymphocytes Relative: 35 %
MCH: 33.1 pg — ABNORMAL HIGH (ref 27.0–33.0)
MCHC: 33.9 g/dL (ref 32.0–36.0)
MCV: 97.6 fL (ref 80.0–100.0)
MONO ABS: 549 {cells}/uL (ref 200–950)
MPV: 9.1 fL (ref 7.5–12.5)
Monocytes Relative: 9 %
Neutro Abs: 3294 cells/uL (ref 1500–7800)
Neutrophils Relative %: 54 %
Platelets: 304 10*3/uL (ref 140–400)
RBC: 4.2 MIL/uL (ref 3.80–5.10)
RDW: 14.4 % (ref 11.0–15.0)
WBC: 6.1 10*3/uL (ref 3.8–10.8)

## 2015-09-12 NOTE — Patient Instructions (Addendum)
Try dissolvable/gummies melatonin up to 25m at night 366ms before bed  Multinodular thyroid goiter or Multinodular thyroid gland.  Goiter means thyroid enlargement so if your thyroid is enlarged it is multinodular thyroid goiter or otherwise it is called multinodular thyroid gland.  It is very common, in more than 10 % of the population. That is about [place] completely [place]. We do not recommend biopsy until the nodules reach 1536mOtherwise we monitor your thyroid with an ultrasound and repeated blood work monitoring its function. If they nodules are not increasing in size, we often stop monitoring with ultrasound.    Preventive Care for Adults A healthy lifestyle and preventive care can promote health and wellness. Preventive health guidelines for women include the following key practices.  A routine yearly physical is a good way to check with your health care provider about your health and preventive screening. It is a chance to share any concerns and updates on your health and to receive a thorough exam.  Visit your dentist for a routine exam and preventive care every 6 months. Brush your teeth twice a day and floss once a day. Good oral hygiene prevents tooth decay and gum disease.  The frequency of eye exams is based on your age, health, family medical history, use of contact lenses, and other factors. Follow your health care provider's recommendations for frequency of eye exams.  Eat a healthy diet. Foods like vegetables, fruits, whole grains, low-fat dairy products, and lean protein foods contain the nutrients you need without too many calories. Decrease your intake of foods high in solid fats, added sugars, and salt. Eat the right amount of calories for you.Get information about a proper diet from your health care provider, if necessary.  Regular physical exercise is one of the most important things you can do for your health. Most  adults should get at least 150 minutes of moderate-intensity exercise (any activity that increases your heart rate and causes you to sweat) each week. In addition, most adults need muscle-strengthening exercises on 2 or more days a week.  Maintain a healthy weight. The body mass index (BMI) is a screening tool to identify possible weight problems. It provides an estimate of body fat based on height and weight. Your health care provider can find your BMI and can help you achieve or maintain a healthy weight.For adults 20 years and older:  A BMI below 18.5 is considered underweight.  A BMI of 18.5 to 24.9 is normal.  A BMI of 25 to 29.9 is considered overweight.  A BMI of 30 and above is considered obese.  Maintain normal blood lipids and cholesterol levels by exercising and minimizing your intake of saturated fat. Eat a balanced diet with plenty of fruit and vegetables. If your lipid or cholesterol levels are high, you are over 50, or you are at high risk for heart disease, you may need your cholesterol levels checked more frequently.Ongoing high lipid and cholesterol levels should be treated with medicines if diet and exercise are not working.  If you smoke, find out from your health care provider how to quit. If you do not use tobacco, do not start.  Lung cancer screening is recommended for adults aged 55-69-80ars who are at high risk for developing lung cancer because of a history of smoking. A yearly low-dose CT scan of the lungs is recommended for people who have at least a 30-pack-year history of smoking and are  a current smoker or have quit within the past 15 years. A pack year of smoking is smoking an average of 1 pack of cigarettes a day for 1 year (for example: 1 pack a day for 30 years or 2 packs a day for 15 years). Yearly screening should continue until the smoker has stopped smoking for at least 15 years. Yearly screening should be stopped for people who develop a health problem that  would prevent them from having lung cancer treatment.  Avoid use of street drugs. Do not share needles with anyone. Ask for help if you need support or instructions about stopping the use of drugs.  High blood pressure causes heart disease and increases the risk of stroke.  Ongoing high blood pressure should be treated with medicines if weight loss and exercise do not work.  If you are 65-35 years old, ask your health care provider if you should take aspirin to prevent strokes.  Diabetes screening involves taking a blood sample to check your fasting blood sugar level. This should be done once every 3 years, after age 50, if you are within normal weight and without risk factors for diabetes. Testing should be considered at a younger age or be carried out more frequently if you are overweight and have at least 1 risk factor for diabetes.  Breast cancer screening is essential preventive care for women. You should practice "breast self-awareness." This means understanding the normal appearance and feel of your breasts and may include breast self-examination. Any changes detected, no matter how small, should be reported to a health care provider. Women in their 71s and 30s should have a clinical breast exam (CBE) by a health care provider as part of a regular health exam every 1 to 3 years. After age 50, women should have a CBE every year. Starting at age 62, women should consider having a mammogram (breast X-ray test) every year. Women who have a family history of breast cancer should talk to their health care provider about genetic screening. Women at a high risk of breast cancer should talk to their health care providers about having an MRI and a mammogram every year.  Breast cancer gene (BRCA)-related cancer risk assessment is recommended for women who have family members with BRCA-related cancers. BRCA-related cancers include breast, ovarian, tubal, and peritoneal cancers. Having family members with  these cancers may be associated with an increased risk for harmful changes (mutations) in the breast cancer genes BRCA1 and BRCA2. Results of the assessment will determine the need for genetic counseling and BRCA1 and BRCA2 testing.  Routine pelvic exams to screen for cancer are no longer recommended for nonpregnant women who are considered low risk for cancer of the pelvic organs (ovaries, uterus, and vagina) and who do not have symptoms. Ask your health care provider if a screening pelvic exam is right for you.  If you have had past treatment for cervical cancer or a condition that could lead to cancer, you need Pap tests and screening for cancer for at least 20 years after your treatment. If Pap tests have been discontinued, your risk factors (such as having a new sexual partner) need to be reassessed to determine if screening should be resumed. Some women have medical problems that increase the chance of getting cervical cancer. In these cases, your health care provider may recommend more frequent screening and Pap tests.    Colorectal cancer can be detected and often prevented. Most routine colorectal cancer screening begins at the age  of 50 years and continues through age 63 years. However, your health care provider may recommend screening at an earlier age if you have risk factors for colon cancer. On a yearly basis, your health care provider may provide home test kits to check for hidden blood in the stool. Use of a small camera at the end of a tube, to directly examine the colon (sigmoidoscopy or colonoscopy), can detect the earliest forms of colorectal cancer. Talk to your health care provider about this at age 57, when routine screening begins. Direct exam of the colon should be repeated every 5-10 years through age 52 years, unless early forms of pre-cancerous polyps or small growths are found.  Osteoporosis is a disease in which the bones lose minerals and strength with aging. This can result  in serious bone fractures or breaks. The risk of osteoporosis can be identified using a bone density scan. Women ages 61 years and over and women at risk for fractures or osteoporosis should discuss screening with their health care providers. Ask your health care provider whether you should take a calcium supplement or vitamin D to reduce the rate of osteoporosis.  Menopause can be associated with physical symptoms and risks. Hormone replacement therapy is available to decrease symptoms and risks. You should talk to your health care provider about whether hormone replacement therapy is right for you.  Use sunscreen. Apply sunscreen liberally and repeatedly throughout the day. You should seek shade when your shadow is shorter than you. Protect yourself by wearing long sleeves, pants, a wide-brimmed hat, and sunglasses year round, whenever you are outdoors.  Once a month, do a whole body skin exam, using a mirror to look at the skin on your back. Tell your health care provider of new moles, moles that have irregular borders, moles that are larger than a pencil eraser, or moles that have changed in shape or color.  Stay current with required vaccines (immunizations).  Influenza vaccine. All adults should be immunized every year.  Tetanus, diphtheria, and acellular pertussis (Td, Tdap) vaccine. Pregnant women should receive 1 dose of Tdap vaccine during each pregnancy. The dose should be obtained regardless of the length of time since the last dose. Immunization is preferred during the 27th-36th week of gestation. An adult who has not previously received Tdap or who does not know her vaccine status should receive 1 dose of Tdap. This initial dose should be followed by tetanus and diphtheria toxoids (Td) booster doses every 10 years. Adults with an unknown or incomplete history of completing a 3-dose immunization series with Td-containing vaccines should begin or complete a primary immunization series  including a Tdap dose. Adults should receive a Td booster every 10 years.    Zoster vaccine. One dose is recommended for adults aged 93 years or older unless certain conditions are present.    Pneumococcal 13-valent conjugate (PCV13) vaccine. When indicated, a person who is uncertain of her immunization history and has no record of immunization should receive the PCV13 vaccine. An adult aged 56 years or older who has certain medical conditions and has not been previously immunized should receive 1 dose of PCV13 vaccine. This PCV13 should be followed with a dose of pneumococcal polysaccharide (PPSV23) vaccine. The PPSV23 vaccine dose should be obtained at least 8 weeks after the dose of PCV13 vaccine. An adult aged 29 years or older who has certain medical conditions and previously received 1 or more doses of PPSV23 vaccine should receive 1 dose of PCV13. The  PCV13 vaccine dose should be obtained 1 or more years after the last PPSV23 vaccine dose.    Pneumococcal polysaccharide (PPSV23) vaccine. When PCV13 is also indicated, PCV13 should be obtained first. All adults aged 38 years and older should be immunized. An adult younger than age 79 years who has certain medical conditions should be immunized. Any person who resides in a nursing home or long-term care facility should be immunized. An adult smoker should be immunized. People with an immunocompromised condition and certain other conditions should receive both PCV13 and PPSV23 vaccines. People with human immunodeficiency virus (HIV) infection should be immunized as soon as possible after diagnosis. Immunization during chemotherapy or radiation therapy should be avoided. Routine use of PPSV23 vaccine is not recommended for American Indians, [place] Natives, or people younger than 65 years unless there are medical conditions that require PPSV23 vaccine. When indicated, people who have unknown immunization and have no record of immunization should receive  PPSV23 vaccine. One-time revaccination 5 years after the first dose of PPSV23 is recommended for people aged 19-64 years who have chronic kidney failure, nephrotic syndrome, asplenia, or immunocompromised conditions. People who received 1-2 doses of PPSV23 before age 45 years should receive another dose of PPSV23 vaccine at age 68 years or later if at least 5 years have passed since the previous dose. Doses of PPSV23 are not needed for people immunized with PPSV23 at or after age 51 years.   Preventive Services / Frequency  Ages 70 years and over  Blood pressure check.  Lipid and cholesterol check.  Lung cancer screening. / Every year if you are aged 80-80 years and have a 30-pack-year history of smoking and currently smoke or have quit within the past 15 years. Yearly screening is stopped once you have quit smoking for at least 15 years or develop a health problem that would prevent you from having lung cancer treatment.  Clinical breast exam.** / Every year after age 34 years.  BRCA-related cancer risk assessment.** / For women who have family members with a BRCA-related cancer (breast, ovarian, tubal, or peritoneal cancers).  Mammogram.** / Every year beginning at age 69 years and continuing for as long as you are in good health. Consult with your health care provider.  Pap test.** / Every 3 years starting at age 3 years through age 40 or 62 years with 3 consecutive normal Pap tests. Testing can be stopped between 65 and 70 years with 3 consecutive normal Pap tests and no abnormal Pap or HPV tests in the past 10 years.  Fecal occult blood test (FOBT) of stool. / Every year beginning at age 67 years and continuing until age 50 years. You may not need to do this test if you get a colonoscopy every 10 years.  Flexible sigmoidoscopy or colonoscopy.** / Every 5 years for a flexible sigmoidoscopy or every 10 years for a colonoscopy beginning at age 38 years and continuing until age 49  years.  Hepatitis C blood test.** / For all people born from [birth year] through 1964 and any individual with known risks for hepatitis C.  Osteoporosis screening.** / A one-time screening for women ages 64 years and over and women at risk for fractures or osteoporosis.  Skin self-exam. / Monthly.  Influenza vaccine. / Every year.  Tetanus, diphtheria, and acellular pertussis (Tdap/Td) vaccine.** / 1 dose of Td every 10 years.  Zoster vaccine.** / 1 dose for adults aged 78 years or older.  Pneumococcal 13-valent conjugate (PCV13) vaccine.** / Consult  your health care provider.  Pneumococcal polysaccharide (PPSV23) vaccine.** / 1 dose for all adults aged 54 years and older. Screening for abdominal aortic aneurysm (AAA)  by ultrasound is recommended for people who have history of high blood pressure or who are current or former smokers.

## 2015-09-12 NOTE — Addendum Note (Signed)
Addended by: Clarisse Rodriges A on: 09/12/2015 02:56 PM   Modules accepted: Orders

## 2015-09-12 NOTE — Progress Notes (Signed)
Complete Physical  Assessment and Plan: 1. Essential hypertension - continue medications, DASH diet, exercise and monitor at home. Call if greater than 130/80.  - CBC with Differential/Platelet - BASIC METABOLIC PANEL WITH GFR - Hepatic function panel - TSH - Urinalysis, Routine w reflex microscopic (not at [facility]) - Microalbumin / creatinine urine ratio - EKG 12-Lead  2. HYPERCHOLESTEROLEMIA -continue medications, check lipids, decrease fatty foods, increase activity. - Lipid panel  3. Abnormal glucose Discussed general issues about diabetes pathophysiology and management., Educational material distributed., Suggested low cholesterol diet., Encouraged aerobic exercise., Discussed foot care., Reminded to get yearly retinal exam.  4. Gastroesophageal reflux disease without esophagitis Continue PPI/H2 blocker, diet discussed  5. Major depression in remission Remission, off meds  6. Insomnia Continue xanax PRN  7. Vitamin D deficiency - Vit D  25 hydroxy (rtn osteoporosis monitoring)  8. Medication management - Magnesium  9.  Osteopenia - DG Bone Density; Future- NEEDS NEXT YEAR, increase weight lifting/vitamin D  10. Multiple thyroid nodules - will just check clinically or in 2-3 more years, patient declines  Discussed med's effects and SE's. Screening labs and tests as requested with regular follow-up as recommended. Over 40 minutes of exam, counseling, chart review, and critical decision making was performed this visit.   HPI  70 y.o. female  presents for a complete physical.  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 She denies chest pain, shortness of breath, dizziness.  She is not on cholesterol medication and denies myalgias. Her cholesterol is not at goal. The cholesterol last visit was:   Lab Results  Component Value Date   CHOL 217* 02/08/2015    HDL 50 02/08/2015   LDLCALC 138* 02/08/2015   TRIG 147 02/08/2015   CHOLHDL 4.3 02/08/2015   She has been working on diet and exercise for prediabetes, Last A1C in the office was:  Lab Results  Component Value Date   HGBA1C 5.5 05/04/2014   Patient is on Vitamin D supplement.   Lab Results  Component Value Date   VD25OH 51 02/08/2015     She is on celexa for depression and is doing well.  She uses the xanax at night for insomnia, not during the day.  She has ICS and takes elavil occ.   Current Medications:  Current Outpatient Prescriptions on File Prior to Visit  Medication Sig Dispense Refill  . ALPRAZolam (XANAX) 0.5 MG tablet TAKE 1 TABLET BY MOUTH 3 TIMES A DAY AS NEEDED FOR ANXIETY 90 tablet 1  . amitriptyline (ELAVIL) 25 MG tablet TAKE 1/2-1 TABLET BY MOUTH AT BEDTIME AS NEEDED FOR BLADDER SPASM 30 tablet 0  . Cholecalciferol (VITAMIN D) 400 UNITS capsule Take by mouth daily.     . citalopram (CELEXA) 40 MG tablet TAKE 1/2 TO 1 TABLET BY MOUTH EVERY DAY 90 tablet 0  . NIACIN CR PO Take by mouth daily.     No current facility-administered medications on file prior to visit.   Health Maintenance:   Last colonoscopy: 10/2012 Last mammogram 3D: 10/2014, CAT C Last pap smear/pelvic exam: 2011 remote, declines another DEXA: 10/2014- osteopenia  Ct chest 2009 US soft tissue head 09/2014  Prior vaccinations: TD or Tdap: 1999 and declines Influenza: declines Pneumococcal: 2000 declines Prevnar 13 declines Shingles/Zostavax: declines due to cost  Names of Other Physician/Practitioners you currently use: 1. [facility]-  here for primary care 2. [facility], eye doctor, last visit 07/2015 3. Dr. Carlis Abbott, dentist, last visit 07/2015 Patient Care Team: Patient Care Team: Unk Pinto, MD as PCP - General (Internal Medicine) Inda Castle, MD as Consulting Physician (Gastroenterology) Irine Seal, MD as Attending Physician  (Urology) Crista Luria, MD as Consulting Physician (Dermatology) Chauncey Cruel, MD as Consulting Physician (Oncology)  Allergies:  Allergies  Allergen Reactions  . Codeine   . Decongestant [Pseudoephedrine Hcl]    Medical History:  Past Medical History  Diagnosis Date  . Unspecified essential hypertension   . Pure hypercholesterolemia   . Insomnia, unspecified   . Depressive disorder, not elsewhere classified   . Breast cancer ([place])     left   . Anxiety   . GERD (gastroesophageal reflux disease)   . IBS (irritable bowel syndrome)   . Hx of colonic polyps 2003    Colonoscopy   . Esophagitis, unspecified 2001    EGD  . Stricture and stenosis of esophagus 2001, 2007    EGD  . Diverticulosis of colon (without mention of hemorrhage) 2007    Colonoscopy  . Internal hemorrhoids without mention of complication [id number]    Colonoscopy    Surgical History:  Past Surgical History  Procedure Laterality Date  . Abdominal hysterectomy    . Cholecystectomy  2004  . Breast lumpectomy  2005    left  . Nasal sinus surgery     Family History:  Family History  Problem Relation Age of Onset  . Bipolar disorder Mother   . Breast cancer Mother   . Heart disease Mother   . Colon cancer Neg Hx   . Heart disease Father   . Parkinson's disease Sister    Social History:  Social History  Substance Use Topics  . Smoking status: Former Smoker -- 1.00 packs/day for 35 years    Quit date: 03/28/2012  . Smokeless tobacco: Never Used     Comment: Counseling sheet given in exam room   . Alcohol Use: 0.6 oz/week    1 Glasses of wine per week   Review of Systems: Review of Systems  Constitutional: Negative.   HENT: Negative for congestion, ear discharge, ear pain, hearing loss, nosebleeds, sore throat and tinnitus.   Eyes: Negative.   Respiratory: Negative.  Negative for stridor.   Cardiovascular: Negative.   Gastrointestinal: Negative.   Genitourinary: Negative for dysuria, urgency,  frequency, hematuria and flank pain.  Musculoskeletal: Negative.   Skin: Negative.   Neurological: Negative.  Negative for headaches.  Endo/Heme/Allergies: Negative.   Psychiatric/Behavioral: Negative.     Physical Exam: Estimated body mass index is 21.19 kg/(m^2) as calculated from the following:   Height as of this encounter: 5' 1.5" (1.562 m).   Weight as of this encounter: 114 lb (51.71 kg). BP 122/68 mmHg  Pulse 72  Temp(Src) 98 F (36.7 C) (Temporal)  Resp 16  Ht 5' 1.5" (1.562 m)  Wt 114 lb (51.71 kg)  BMI 21.19 kg/m2 General Appearance: Well nourished, in no apparent distress.  Eyes: PERRLA, EOMs, conjunctiva no swelling or erythema, normal fundi and vessels.  Sinuses: No Frontal/maxillary tenderness  ENT/Mouth: Ext aud canals clear, normal light reflex with TMs without erythema, bulging. Good dentition. No erythema, swelling, or exudate on post pharynx. Tonsils not swollen or erythematous. Hearing normal.  Neck: Supple, thyroid slightly enlarged on right side with multiple bilateral nodules. No bruits  Respiratory: Respiratory effort normal, BS equal bilaterally without rales, rhonchi, wheezing or stridor.  Cardio: RRR without murmurs, rubs or gallops. Brisk peripheral pulses without edema.  Chest: symmetric, with normal excursions and percussion.  Breasts: + scar tissue on left upper outer quadrant from previous lumpectomy, without lumps, nipple discharge, retractions.  Abdomen: Soft, nontender, no guarding, rebound, hernias, masses, or organomegaly.  Lymphatics: Non tender without lymphadenopathy.  Genitourinary: defer Musculoskeletal: Full ROM all peripheral extremities,5/5 strength, and normal gait.  Skin: Warm, dry without rashes, lesions, ecchymosis. Neuro: Cranial nerves intact, reflexes equal bilaterally. Normal muscle tone, no cerebellar symptoms. Sensation intact.  Psych: Awake and oriented X 3, normal affect, Insight and Judgment appropriate.   EKG: WNL no  changes.  Vicie Mutters 1:56 PM [facility]

## 2015-09-13 LAB — MICROALBUMIN / CREATININE URINE RATIO
Creatinine, Urine: 194 mg/dL (ref 20–320)
Microalb Creat Ratio: 6 mcg/mg creat (ref <30)
Microalb, Ur: 1.2 mg/dL

## 2015-09-13 LAB — LIPID PANEL
Cholesterol: 246 mg/dL — ABNORMAL HIGH (ref 125–200)
HDL: 48 mg/dL (ref >=46)
LDL CALC: 158 mg/dL — AB (ref <130)
Total CHOL/HDL Ratio: 5.1 Ratio — ABNORMAL HIGH (ref <=5.0)
Triglycerides: 202 mg/dL — ABNORMAL HIGH (ref <150)
VLDL: 40 mg/dL — AB (ref <30)

## 2015-09-13 LAB — BASIC METABOLIC PANEL WITH GFR
BUN: 12 mg/dL (ref 7–25)
CALCIUM: 9.2 mg/dL (ref 8.6–10.4)
CHLORIDE: 102 mmol/L (ref 98–110)
CO2: 24 mmol/L (ref 20–31)
Creat: 0.59 mg/dL (ref 0.50–0.99)
GFR, Est African American: >89 mL/min (ref >=60)
Glucose, Bld: 82 mg/dL (ref 65–99)
POTASSIUM: 4.2 mmol/L (ref 3.5–5.3)
Sodium: 140 mmol/L (ref 135–146)

## 2015-09-13 LAB — URINALYSIS, ROUTINE W REFLEX MICROSCOPIC
Bilirubin Urine: NEGATIVE
GLUCOSE, UA: NEGATIVE
KETONES UR: NEGATIVE
Leukocytes, UA: NEGATIVE
NITRITE: NEGATIVE
Protein, ur: NEGATIVE
Specific Gravity, Urine: 1.023 (ref 1.001–1.035)
pH: 5.5 (ref 5.0–8.0)

## 2015-09-13 LAB — MAGNESIUM: MAGNESIUM: 2.3 mg/dL (ref 1.5–2.5)

## 2015-09-13 LAB — URINALYSIS, MICROSCOPIC ONLY
Casts: NONE SEEN [LPF]
SQUAMOUS EPITHELIAL / LPF: NONE SEEN [HPF] (ref <=5)
WBC, UA: NONE SEEN WBC/HPF (ref <=5)
Yeast: NONE SEEN [HPF]

## 2015-09-13 LAB — HEMOGLOBIN A1C
Hgb A1c MFr Bld: 5.6 % (ref <5.7)
MEAN PLASMA GLUCOSE: 114 mg/dL

## 2015-09-13 LAB — HEPATIC FUNCTION PANEL
ALK PHOS: 92 U/L (ref 33–130)
ALT: 16 U/L (ref 6–29)
AST: 21 U/L (ref 10–35)
Albumin: 4.5 g/dL (ref 3.6–5.1)
BILIRUBIN INDIRECT: 0.4 mg/dL (ref 0.2–1.2)
Bilirubin, Direct: 0.1 mg/dL (ref <=0.2)
Total Bilirubin: 0.5 mg/dL (ref 0.2–1.2)
Total Protein: 7 g/dL (ref 6.1–8.1)

## 2015-09-13 LAB — VITAMIN D 25 HYDROXY (VIT D DEFICIENCY, FRACTURES): Vit D, 25-Hydroxy: 48 ng/mL (ref 30–100)

## 2015-09-13 LAB — TSH: TSH: 2.69 mIU/L

## 2015-09-13 LAB — HEPATITIS C ANTIBODY: HCV Ab: NEGATIVE

## 2015-10-04 ENCOUNTER — Other Ambulatory Visit: Payer: Self-pay | Admitting: Internal Medicine

## 2015-10-20 ENCOUNTER — Other Ambulatory Visit: Payer: Self-pay | Admitting: Internal Medicine

## 2015-10-20 DIAGNOSIS — Z1231 Encounter for screening mammogram for malignant neoplasm of breast: Secondary | ICD-10-CM

## 2015-11-02 ENCOUNTER — Ambulatory Visit
Admission: RE | Admit: 2015-11-02 | Discharge: 2015-11-02 | Disposition: A | Discharge status: Home / Self Care | Payer: Medicare Other | Source: Ambulatory Visit | Attending: Internal Medicine | Admitting: Internal Medicine

## 2015-11-02 DIAGNOSIS — Z1231 Encounter for screening mammogram for malignant neoplasm of breast: Secondary | ICD-10-CM

## 2015-11-13 ENCOUNTER — Other Ambulatory Visit: Payer: Self-pay | Admitting: Physician Assistant

## 2016-02-19 ENCOUNTER — Other Ambulatory Visit: Payer: Self-pay | Admitting: Physician Assistant

## 2016-03-19 ENCOUNTER — Encounter: Payer: Self-pay | Admitting: Internal Medicine

## 2016-03-19 ENCOUNTER — Ambulatory Visit: Payer: Self-pay | Admitting: Internal Medicine

## 2016-03-19 ENCOUNTER — Ambulatory Visit (INDEPENDENT_AMBULATORY_CARE_PROVIDER_SITE_OTHER): Payer: Medicare Other | Admitting: Internal Medicine

## 2016-03-19 VITALS — BP 108/56 | HR 70 | Temp 98.2°F | Resp 16 | Ht 61.5 in | Wt 112.0 lb

## 2016-03-19 DIAGNOSIS — E7800 Pure hypercholesterolemia, unspecified: Secondary | ICD-10-CM | POA: Diagnosis not present

## 2016-03-19 DIAGNOSIS — Z79899 Other long term (current) drug therapy: Secondary | ICD-10-CM | POA: Diagnosis not present

## 2016-03-19 DIAGNOSIS — M8580 Other specified disorders of bone density and structure, unspecified site: Secondary | ICD-10-CM

## 2016-03-19 DIAGNOSIS — G4700 Insomnia, unspecified: Secondary | ICD-10-CM | POA: Diagnosis not present

## 2016-03-19 DIAGNOSIS — R002 Palpitations: Secondary | ICD-10-CM | POA: Diagnosis not present

## 2016-03-19 DIAGNOSIS — E559 Vitamin D deficiency, unspecified: Secondary | ICD-10-CM

## 2016-03-19 DIAGNOSIS — K219 Gastro-esophageal reflux disease without esophagitis: Secondary | ICD-10-CM | POA: Diagnosis not present

## 2016-03-19 DIAGNOSIS — I10 Essential (primary) hypertension: Secondary | ICD-10-CM | POA: Diagnosis not present

## 2016-03-19 DIAGNOSIS — Z0000 Encounter for general adult medical examination without abnormal findings: Secondary | ICD-10-CM

## 2016-03-19 DIAGNOSIS — R6889 Other general symptoms and signs: Secondary | ICD-10-CM | POA: Diagnosis not present

## 2016-03-19 DIAGNOSIS — Z0001 Encounter for general adult medical examination with abnormal findings: Secondary | ICD-10-CM | POA: Diagnosis not present

## 2016-03-19 DIAGNOSIS — R7309 Other abnormal glucose: Secondary | ICD-10-CM | POA: Diagnosis not present

## 2016-03-19 DIAGNOSIS — F325 Major depressive disorder, single episode, in full remission: Secondary | ICD-10-CM | POA: Diagnosis not present

## 2016-03-19 NOTE — Progress Notes (Signed)
MEDICARE ANNUAL WELLNESS VISIT AND FOLLOW UP  Assessment:    1. Essential hypertension -cont meds -dash diet -exercise as tolerated -monitor at home  2. Gastroesophageal reflux disease without esophagitis -avoid trigger foods -cont meds prn   3. Osteopenia, unspecified location -calcium -Vit D  4. Abnormal glucose -cont diet and exercise   5. Encounter for Medicare annual wellness exam -due next year  6. HYPERCHOLESTEROLEMIA -cont diet and exercise -refuses medications  7. Insomnia, unspecified type -cont diet and exercises -uses xanax  8. Major depression in remission ([place]) -cont meds -cont xanax   9. Medication management -cont lab monitoring  10. Palpitations -related to anxiety  11. Vitamin D deficiency -cont Vit D    Over 30 minutes of exam, counseling, chart review, and critical decision making was performed  Future Appointments Date Time Provider [place]  09/24/2016 2:00 PM Vicie Mutters, PA-C [id number] None    Plan:   During the course of the visit the patient was educated and counseled about appropriate screening and preventive services including:    Pneumococcal vaccine   Influenza vaccine  Td vaccine  Prevnar 13  Screening electrocardiogram  Screening mammography  Bone densitometry screening  Colorectal cancer screening  Diabetes screening  Glaucoma screening  Nutrition counseling   Advanced directives: given info/requested copies   Subjective:   Samantha [name] is a 70 y.o. female who presents for Medicare Annual Wellness Visit and 3 month follow up on hypertension, prediabetes, hyperlipidemia, vitamin D def.   Her blood pressure has been controlled at home, today their BP is BP: (!) 108/56 She does workout. She denies chest pain, shortness of breath, dizziness.  She is on cholesterol medication and denies myalgias. Her cholesterol is at goal. The cholesterol last visit was:  Lab Results  Component  Value Date   CHOL 246 (H) 09/12/2015   HDL 48 09/12/2015   LDLCALC 158 (H) 09/12/2015   TRIG 202 (H) 09/12/2015   CHOLHDL 5.1 (H) 09/12/2015   She has been working on diet and exercise for prediabetes, and denies foot ulcerations, hyperglycemia, hypoglycemia , increased appetite, nausea, paresthesia of the feet, polydipsia, polyuria, visual disturbances, vomiting and weight loss. Last A1C in the office was: Lab Results  Component Value Date   HGBA1C 5.6 09/12/2015   Last GFR Lab Results  Component Value Date   GFRNONAA >89 09/12/2015   Lab Results  Component Value Date   GFRAA >89 09/12/2015   Patient is on Vitamin D supplement. Lab Results  Component Value Date   VD25OH 48 09/12/2015      Medication Review Current Outpatient Prescriptions on File Prior to Visit  Medication Sig Dispense Refill  . ALPRAZolam (XANAX) 0.5 MG tablet TAKE 1 TABLET BY MOUTH 3 TIMES A DAY 90 tablet 0  . Cholecalciferol (VITAMIN D) 400 UNITS capsule Take by mouth daily.     . citalopram (CELEXA) 40 MG tablet Take 1/2 to 1 tablet daily for Mood as directed 90 tablet 1  . Magnesium 250 MG TABS Take 250 mg by mouth daily.    . Multiple Vitamin (MULTIVITAMIN) tablet Take 1 tablet by mouth daily.    Marland Kitchen NIACIN CR PO Take by mouth daily.     No current facility-administered medications on file prior to visit.     Allergies: Allergies  Allergen Reactions  . Codeine   . Decongestant [Pseudoephedrine Hcl]     Current Problems (verified) has HYPERCHOLESTEROLEMIA; Major depression in remission ([place]); Essential hypertension; Insomnia; PALPITATIONS; Esophageal reflux;  Abnormal glucose; Vitamin D deficiency; Medication management; Osteopenia; and Encounter for Medicare annual wellness exam on her problem list.  Screening Tests  There is no immunization history on file for this patient.  Preventative care: Last colonoscopy: 2014 Last mammogram: 11/02/15 DEXA:2016  Declines all vaccinations  Names of  Other Physician/Practitioners you currently use: 1. [facility]- here for primary care 2. Dr. Delman Cheadle, eye doctor, last visit 2016 3. Dr. Farrel Demark, dentist, last visit 2017 Patient Care Team: Unk Pinto, MD as PCP - General (Internal Medicine) Inda Castle, MD as Consulting Physician (Gastroenterology) Irine Seal, MD as Attending Physician (Urology) Crista Luria, MD as Consulting Physician (Dermatology) Chauncey Cruel, MD as Consulting Physician (Oncology)  Surgical: She  has a past surgical history that includes Abdominal hysterectomy; Cholecystectomy (2004); Breast lumpectomy (2005); and Nasal sinus surgery. Family Her family history includes Bipolar disorder in her mother; Breast cancer in her mother; Heart disease in her father and mother; Parkinson's disease in her sister. Social history  She reports that she quit smoking about 3 years ago. She has a 35.00 pack-year smoking history. She has never used smokeless tobacco. She reports that she drinks about 0.6 oz of alcohol per week . She reports that she does not use drugs.  MEDICARE WELLNESS OBJECTIVES: Physical activity:   Cardiac risk factors:   Depression/mood screen:   Depression screen [facility] 2/9 09/12/2015  Decreased Interest 0  Down, Depressed, Hopeless 0  PHQ - 2 Score 0    ADLs:  No flowsheet data found.   Cognitive Testing  Alert? Yes  Normal Appearance?Yes  Oriented to person? Yes  Place? Yes   Time? Yes  Recall of three objects?  Yes  Can perform simple calculations? Yes  Displays appropriate judgment?Yes  Can read the correct time from a watch face?Yes  EOL planning:     Objective:   Today's Vitals   03/19/16 1448  BP: (!) 108/56  Pulse: 70  Resp: 16  Temp: 98.2 F (36.8 C)  TempSrc: Temporal  Weight: 112 lb (50.8 kg)  Height: 5' 1.5" (1.562 m)   Body mass index is 20.82 kg/m.  General appearance: alert, no distress, WD/WN,  female HEENT:  normocephalic, sclerae anicteric, TMs pearly, nares patent, no discharge or erythema, pharynx normal Oral cavity: MMM, no lesions Neck: supple, no lymphadenopathy, no thyromegaly, no masses Heart: RRR, normal S1, S2, no murmurs Lungs: CTA bilaterally, no wheezes, rhonchi, or rales Abdomen: +bs, soft, non tender, non distended, no masses, no hepatomegaly, no splenomegaly Musculoskeletal: nontender, no swelling, no obvious deformity Extremities: no edema, no cyanosis, no clubbing Pulses: 2+ symmetric, upper and lower extremities, normal cap refill Neurological: alert, oriented x 3, CN2-12 intact, strength normal upper extremities and lower extremities, sensation normal throughout, DTRs 2+ throughout, no cerebellar signs, gait normal Psychiatric: normal affect, behavior normal, pleasant

## 2016-04-09 ENCOUNTER — Other Ambulatory Visit: Payer: Self-pay | Admitting: Physician Assistant

## 2016-04-09 NOTE — Telephone Encounter (Signed)
Please call Alpraz  

## 2016-04-10 NOTE — Telephone Encounter (Signed)
Xanax was called into [facility] pharmacy.

## 2016-05-16 ENCOUNTER — Ambulatory Visit (INDEPENDENT_AMBULATORY_CARE_PROVIDER_SITE_OTHER): Payer: Medicare Other | Admitting: Physician Assistant

## 2016-05-16 ENCOUNTER — Encounter: Payer: Self-pay | Admitting: Physician Assistant

## 2016-05-16 VITALS — BP 116/72 | HR 98 | Temp 97.7°F | Resp 16 | Ht 61.5 in | Wt 113.8 lb

## 2016-05-16 DIAGNOSIS — N3010 Interstitial cystitis (chronic) without hematuria: Secondary | ICD-10-CM

## 2016-05-16 DIAGNOSIS — R002 Palpitations: Secondary | ICD-10-CM | POA: Diagnosis not present

## 2016-05-16 LAB — CBC WITH DIFFERENTIAL/PLATELET
BASOS PCT: 1 %
Basophils Absolute: 45 cells/uL (ref 0–200)
Eosinophils Absolute: 90 cells/uL (ref 15–500)
Eosinophils Relative: 2 %
HCT: 38.4 % (ref 35.0–45.0)
Hemoglobin: 13 g/dL (ref 11.7–15.5)
Lymphocytes Relative: 41 %
Lymphs Abs: 1845 cells/uL (ref 850–3900)
MCH: 32.1 pg (ref 27.0–33.0)
MCHC: 33.9 g/dL (ref 32.0–36.0)
MCV: 94.8 fL (ref 80.0–100.0)
MONO ABS: 360 {cells}/uL (ref 200–950)
MPV: 9.5 fL (ref 7.5–12.5)
Monocytes Relative: 8 %
Neutro Abs: 2160 cells/uL (ref 1500–7800)
Neutrophils Relative %: 48 %
Platelets: 269 10*3/uL (ref 140–400)
RBC: 4.05 MIL/uL (ref 3.80–5.10)
RDW: 13.2 % (ref 11.0–15.0)
WBC: 4.5 10*3/uL (ref 3.8–10.8)

## 2016-05-16 LAB — COMPREHENSIVE METABOLIC PANEL
ALK PHOS: 84 U/L (ref 33–130)
ALT: 9 U/L (ref 6–29)
AST: 16 U/L (ref 10–35)
Albumin: 4.4 g/dL (ref 3.6–5.1)
BUN: 14 mg/dL (ref 7–25)
CO2: 27 mmol/L (ref 20–31)
Calcium: 9.5 mg/dL (ref 8.6–10.4)
Chloride: 103 mmol/L (ref 98–110)
Creat: 0.7 mg/dL (ref 0.50–0.99)
GLUCOSE: 69 mg/dL (ref 65–99)
Potassium: 3.7 mmol/L (ref 3.5–5.3)
Sodium: 141 mmol/L (ref 135–146)
Total Bilirubin: 0.4 mg/dL (ref 0.2–1.2)
Total Protein: 6.8 g/dL (ref 6.1–8.1)

## 2016-05-16 LAB — TSH: TSH: 2.61 m[IU]/L

## 2016-05-16 MED ORDER — AMITRIPTYLINE HCL 10 MG PO TABS: ORAL_TABLET | ORAL | 1 refills | Status: DC

## 2016-05-16 NOTE — Progress Notes (Signed)
Subjective:    Patient ID: Samantha [name], female    DOB: 1946/02/21, 71 y.o.   MRN: [id number]  HPI 71 y.o. WF with history of anxiety and palpitations presents with palpitations. She has history of palpitations, however, last night states lasted longer than normal, she also states she had a large dinner, burger and onion rings. Had flutter, lasted x several hours sporadically, denies any accompaniments such as SOB, sweating, dizziness, CP.  Occ happens with coffee. Took 1/2 xanax last night that helped some.   No family history of heart issues.   Blood pressure 116/72, pulse 98, temperature 97.7 F (36.5 C), resp. rate 16, height 5' 1.5" (1.562 m), weight 113 lb 12.8 oz (51.6 kg), SpO2 97 %.  Medications Current Outpatient Prescriptions on File Prior to Visit  Medication Sig  . ALPRAZolam (XANAX) 0.5 MG tablet TAKE 1 TABLET BY MOUTH 3 TIMES A DAY  . Cholecalciferol (VITAMIN D) 400 UNITS capsule Take by mouth daily.   . Magnesium 250 MG TABS Take 250 mg by mouth daily.  . Multiple Vitamin (MULTIVITAMIN) tablet Take 1 tablet by mouth daily.  Marland Kitchen NIACIN CR PO Take by mouth daily.  . citalopram (CELEXA) 40 MG tablet Take 1/2 to 1 tablet daily for Mood as directed   No current facility-administered medications on file prior to visit.     Problem list She has HYPERCHOLESTEROLEMIA; Major depression in remission ([place]); Essential hypertension; Insomnia; PALPITATIONS; Esophageal reflux; Abnormal glucose; Vitamin D deficiency; Medication management; Osteopenia; and Encounter for Medicare annual wellness exam on her problem list.   Review of Systems  Constitutional: Negative.   HENT: Negative.   Respiratory: Negative.  Negative for chest tightness and shortness of breath.   Cardiovascular: Positive for palpitations. Negative for chest pain and leg swelling.  Gastrointestinal: Negative.   Genitourinary: Positive for frequency. Negative for decreased urine volume, difficulty urinating,  dyspareunia, dysuria, enuresis, flank pain, genital sores, hematuria, menstrual problem, pelvic pain, urgency, vaginal bleeding, vaginal discharge and vaginal pain.  Musculoskeletal: Negative.   Skin: Negative.   Neurological: Negative.  Negative for dizziness.  Hematological: Negative.   Psychiatric/Behavioral: Negative.        Objective:   Physical Exam  Constitutional: She is oriented to person, place, and time. She appears well-developed and well-nourished.  HENT:  Head: Normocephalic and atraumatic.  Right Ear: External ear normal.  Left Ear: External ear normal.  Mouth/Throat: Oropharynx is clear and moist.  Eyes: Conjunctivae and EOM are normal. Pupils are equal, round, and reactive to light.  Neck: Normal range of motion. Neck supple. No thyromegaly present.  Cardiovascular: Normal rate, regular rhythm and normal heart sounds.  Exam reveals no gallop and no friction rub.   No murmur heard. Pulmonary/Chest: Effort normal and breath sounds normal. No respiratory distress. She has no wheezes.  Abdominal: Soft. Bowel sounds are normal. She exhibits no distension and no mass. There is no tenderness. There is no rebound and no guarding.  Musculoskeletal: Normal range of motion.  Lymphadenopathy:    She has no cervical adenopathy.  Neurological: She is alert and oriented to person, place, and time. She displays normal reflexes. No cranial nerve deficit. Coordination normal.  Skin: Skin is warm and dry.  Psychiatric: She has a normal mood and affect.      Assessment & Plan:  1. Palpitations Palpitations- check labs, r/u anemia, TSH, dehydration, electrolytes imbalance Check EKG, PPI for 2 weeks Increase water, decrease alcohol/caffeine, valsalva maneuvers taught - EKG 12-Lead -  CBC with Differential/Platelet - Comprehensive metabolic panel - TSH  2. Interstitial cystitis - amitriptyline (ELAVIL) 10 MG tablet; 1-2 at night for sleep  Dispense: 60 tablet; Refill: 1

## 2016-05-16 NOTE — Patient Instructions (Signed)
Palpitations A palpitation is the feeling that your heartbeat is irregular or is faster than normal. It may feel like your heart is fluttering or skipping a beat. Palpitations are usually not a serious problem. They may be caused by many things, including smoking, caffeine, alcohol, stress, and certain medicines. Although most causes of palpitations are not serious, palpitations can be a sign of a serious medical problem. In some cases, you may need further medical evaluation. Follow these instructions at home: Pay attention to any changes in your symptoms. Take these actions to help with your condition:  Avoid the following:  Caffeinated coffee, tea, soft drinks, diet pills, and energy drinks.  Chocolate.  Alcohol.  Do not use any tobacco products, such as cigarettes, chewing tobacco, and e-cigarettes. If you need help quitting, ask your health care provider.  Try to reduce your stress and anxiety. Things that can help you relax include:  Yoga.  Meditation.  Physical activity, such as swimming, jogging, or walking.  Biofeedback. This is a method that helps you learn to use your mind to control things in your body, such as your heartbeats.  Get plenty of rest and sleep.  Take over-the-counter and prescription medicines only as told by your health care provider.  Keep all follow-up visits as told by your health care provider. This is important. Contact a health care provider if:  You continue to have a fast or irregular heartbeat after 24 hours.  Your palpitations occur more often. Get help right away if:  You have chest pain or shortness of breath.  You have a severe headache.  You feel dizzy or you faint. This information is not intended to replace advice given to you by your health care provider. Make sure you discuss any questions you have with your health care provider. Document Released: 03/22/2000 Document Revised: 08/28/2015 Document Reviewed: 12/08/2014 Elsevier  Interactive Patient Education  2017 [place].   Interstitial Cystitis Introduction Interstitial cystitis is a condition that causes inflammation of the bladder. The bladder is a hollow organ in the lower part of your abdomen. It stores urine after the urine is made by your kidneys. With interstitial cystitis, you may have pain in the bladder area. You may also have a frequent and urgent need to urinate. The severity of interstitial cystitis can vary from person to person. You may have flare-ups of the condition, and then it may go away for a while. For many people who have this condition, it becomes a long-term problem. What are the causes? The cause of this condition is not known. What increases the risk? This condition is more likely to develop in women. What are the signs or symptoms? Symptoms of interstitial cystitis vary, and they can change over time. Symptoms may include:  Discomfort or pain in the bladder area. This can range from mild to severe. The pain may change in intensity as the bladder fills with urine or as it empties.  Pelvic pain.  An urgent need to urinate.  Frequent urination.  Pain during sexual intercourse.  Pinpoint bleeding on the bladder wall. For women, the symptoms often get worse during menstruation. How is this diagnosed? This condition is diagnosed by evaluating your symptoms and ruling out other causes. A physical exam will be done. Various tests may be done to rule out other conditions. Common tests include:  Urine tests.  Cystoscopy. In this test, a tool that is like a very thin telescope is used to look into your bladder.  Biopsy.  This involves taking a sample of tissue from the bladder wall to be examined under a microscope. How is this treated? There is no cure for interstitial cystitis, but treatment methods are available to control your symptoms. Work closely with your health care provider to find the treatments that will be most  effective for you. Treatment options may include:  Medicines to relieve pain and to help reduce the number of times that you feel the need to urinate.  Bladder training. This involves learning ways to control when you urinate, such as:  Urinating at scheduled times.  Training yourself to delay urination.  Doing exercises (Kegel exercises) to strengthen the muscles that control urine flow.  Lifestyle changes, such as changing your diet or taking steps to control stress.  Use of a device that provides electrical stimulation in order to reduce pain.  A procedure that stretches your bladder by filling it with air or fluid.  Surgery. This is rare. It is only done for extreme cases if other treatments do not help. Follow these instructions at home:  Take medicines only as directed by your health care provider.  Use bladder training techniques as directed.  Keep a bladder diary to find out which foods, liquids, or activities make your symptoms worse.  Use your bladder diary to schedule bathroom trips. If you are away from home, plan to be near a bathroom at each of your scheduled times.  Make sure you urinate just before you leave the house and just before you go to bed.  Do Kegel exercises as directed by your health care provider.  Do not drink alcohol.  Do not use any tobacco products, including cigarettes, chewing tobacco, or electronic cigarettes. If you need help quitting, ask your health care provider.  Make dietary changes as directed by your health care provider. You may need to avoid spicy foods and foods that contain a high amount of potassium.  Limit your drinking of beverages that stimulate urination. These include soda, coffee, and tea.  Keep all follow-up visits as directed by your health care provider. This is important. Contact a health care provider if:  Your symptoms do not get better after treatment.  Your pain and discomfort are getting worse.  You have  more frequent urges to urinate.  You have a fever. Get help right away if:  You are not able to control your bladder at all. This information is not intended to replace advice given to you by your health care provider. Make sure you discuss any questions you have with your health care provider. Document Released: 11/24/2003 Document Revised: 08/31/2015 Document Reviewed: 11/30/2013  2017 Elsevier

## 2016-05-20 ENCOUNTER — Telehealth: Payer: Self-pay

## 2016-05-20 NOTE — Telephone Encounter (Signed)
Informed pt that she can stop probiotic per provider.

## 2016-05-21 ENCOUNTER — Other Ambulatory Visit: Payer: Self-pay | Admitting: Internal Medicine

## 2016-05-21 MED ORDER — ALPRAZOLAM 0.5 MG PO TABS: 0.5000 mg | ORAL_TABLET | Freq: Three times a day (TID) | ORAL | 0 refills | Status: DC

## 2016-05-21 NOTE — Telephone Encounter (Signed)
Xanax was called into pharmacy on 13th Feb 2018 @ 3:15pm by DD

## 2016-05-21 NOTE — Telephone Encounter (Signed)
Please call Alprazolam 

## 2016-07-08 ENCOUNTER — Other Ambulatory Visit: Payer: Self-pay | Admitting: Physician Assistant

## 2016-07-08 NOTE — Telephone Encounter (Signed)
Xanax was called into pharmacy 2nd April 2018 @ 2:42pm by DD

## 2016-07-30 ENCOUNTER — Other Ambulatory Visit: Payer: Self-pay | Admitting: Internal Medicine

## 2016-08-19 ENCOUNTER — Other Ambulatory Visit: Payer: Self-pay | Admitting: Physician Assistant

## 2016-08-19 NOTE — Telephone Encounter (Signed)
Please call Alpraz  

## 2016-09-23 NOTE — Progress Notes (Signed)
Complete Physical  Assessment and Plan: Essential hypertension - continue medications, DASH diet, exercise and monitor at home. Call if greater than 130/80.  - CBC with Differential/Platelet - BASIC METABOLIC PANEL WITH GFR - Hepatic function panel - TSH - Urinalysis, Routine w reflex microscopic (not at [facility]) - Microalbumin / creatinine urine ratio - EKG 12-Lead   HYPERCHOLESTEROLEMIA -continue medications, check lipids, decrease fatty foods, increase activity. - Lipid panel  Abnormal glucose Discussed general issues about diabetes pathophysiology and management., Educational material distributed., Suggested low cholesterol diet., Encouraged aerobic exercise., Discussed foot care., Reminded to get yearly retinal exam.  Gastroesophageal reflux disease without esophagitis Continue PPI/H2 blocker, diet discussed   Major depression in remission Remission, off meds   Insomnia Continue xanax PRN   Vitamin D deficiency - Vit D  25 hydroxy (rtn osteoporosis monitoring)   Medication management - Magnesium  Osteopenia  increase weight lifting/vitamin D   Multiple thyroid nodules - will just check clinically or in 2-3 more years, patient declines  Discussed med's effects and SE's. Screening labs and tests as requested with regular follow-up as recommended. Over 40 minutes of exam, counseling, chart review, and critical decision making was performed this visit.   HPI  71 y.o. female  presents for a complete physical.  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 She denies chest pain, shortness of breath, dizziness.  She is not on cholesterol medication and denies myalgias. Her cholesterol is not at goal. The cholesterol last visit was:   Lab Results  Component Value Date   CHOL 246 (H) 09/12/2015   HDL 48 09/12/2015   LDLCALC 158 (H) 09/12/2015   TRIG 202 (H)  09/12/2015   CHOLHDL 5.1 (H) 09/12/2015   She has been working on diet and exercise for prediabetes, Last A1C in the office was:  Lab Results  Component Value Date   HGBA1C 5.6 09/12/2015   Patient is on Vitamin D supplement.   Lab Results  Component Value Date   VD25OH 48 09/12/2015     She is on celexa for depression and is doing well.  She uses the xanax at night for insomnia, not during the day.  She has ICS and takes elavil occ.  She has history of GERD, [place], got upset and had reflux, She had burping, belching, and had uncomfortable feeling, does drink coffee when she is in the mountains. She is very active, no symptoms with that.  BMI is Body mass index is 20.78 kg/m., she is working on diet and exercise. Wt Readings from Last 3 Encounters:  09/24/16 111 lb 12.8 oz (50.7 kg)  05/16/16 113 lb 12.8 oz (51.6 kg)  03/19/16 112 lb (50.8 kg)    Current Medications:  Current Outpatient Prescriptions on File Prior to Visit  Medication Sig Dispense Refill  . ALPRAZolam (XANAX) 0.5 MG tablet TAKE 1 TABLET BY MOUTH 3 TIMES A DAY 90 tablet 0  . amitriptyline (ELAVIL) 10 MG tablet 1-2 at night for sleep 60 tablet 1  . Cholecalciferol (VITAMIN D) 400 UNITS capsule Take by mouth daily.     . citalopram (CELEXA) 40 MG tablet TAKE 1/2 TO 1 TABLET BY MOUTH ONCE DAILY FOR MOOD AS DIRECTED ((INSURANCE MAX 30)) 90 tablet 1  . Magnesium 250 MG TABS Take 250 mg by mouth daily.    . Multiple Vitamin (  MULTIVITAMIN) tablet Take 1 tablet by mouth daily.    Marland Kitchen NIACIN CR PO Take by mouth daily.     No current facility-administered medications on file prior to visit.    Health Maintenance:   Last colonoscopy: 10/2012 Last mammogram 3D: 10/2015, CAT C Last pap smear/pelvic exam: 2011 remote, declines another DEXA: 10/2014- osteopenia  Ct chest 2009 CXR 09/2015 US soft tissue head 09/2014  Prior vaccinations: TD or Tdap: 1999 and  declines Influenza: declines Pneumococcal: 2000 declines Prevnar 13 declines Shingles/Zostavax: declines due to cost  Names of Other Physician/Practitioners you currently use: 1. [facility]- here for primary care 2. [facility], eye doctor, last visit 07/2015 3. Dr. Carlis Abbott, dentist, last visit 07/2015 Patient Care Team: Patient Care Team: Unk Pinto, MD as PCP - General (Internal Medicine) Inda Castle, MD as Consulting Physician (Gastroenterology) Irine Seal, MD as Attending Physician (Urology) Crista Luria, MD as Consulting Physician (Dermatology) Magrinat, Virgie Dad, MD as Consulting Physician (Oncology)  Medical History:  Past Medical History:  Diagnosis Date  . Anxiety   . Breast cancer ([place])    left   . Depressive disorder, not elsewhere classified   . Diverticulosis of colon (without mention of hemorrhage) 2007   Colonoscopy  . Esophagitis, unspecified 2001   EGD  . GERD (gastroesophageal reflux disease)   . Hx of colonic polyps 2003   Colonoscopy   . IBS (irritable bowel syndrome)   . Insomnia, unspecified   . Internal hemorrhoids without mention of complication 6283   Colonoscopy   . Pure hypercholesterolemia   . Stricture and stenosis of esophagus 2001, 2007   EGD  . Unspecified essential hypertension    Allergies Allergies  Allergen Reactions  . Codeine   . Decongestant [Pseudoephedrine Hcl]     SURGICAL HISTORY She  has a past surgical history that includes Abdominal hysterectomy; Cholecystectomy (2004); Breast lumpectomy (2005); and Nasal sinus surgery. FAMILY HISTORY Her family history includes Bipolar disorder in her mother; Breast cancer in her mother; Heart disease in her father and mother; Parkinson's disease in her sister. SOCIAL HISTORY She  reports that she quit smoking about 4 years ago. She has a 35.00 pack-year smoking history. She has never used smokeless tobacco. She reports that she  drinks about 0.6 oz of alcohol per week . She reports that she does not use drugs.  Review of Systems: Review of Systems  Constitutional: Negative.   HENT: Negative for congestion, ear discharge, ear pain, hearing loss, nosebleeds, sore throat and tinnitus.   Eyes: Negative.   Respiratory: Negative.  Negative for stridor.   Cardiovascular: Negative.   Gastrointestinal: Negative.   Genitourinary: Negative for dysuria, flank pain, frequency, hematuria and urgency.  Musculoskeletal: Negative.   Skin: Negative.   Neurological: Negative.  Negative for headaches.  Endo/Heme/Allergies: Negative.   Psychiatric/Behavioral: Negative.     Physical Exam: Estimated body mass index is 20.78 kg/m as calculated from the following:   Height as of this encounter: 5' 1.5" (1.562 m).   Weight as of this encounter: 111 lb 12.8 oz (50.7 kg). BP 118/70   Pulse 90   Temp 98.1 F (36.7 C)   Resp 14   Ht 5' 1.5" (1.562 m)   Wt 111 lb 12.8 oz (50.7 kg)   SpO2 98%   BMI 20.78 kg/m  General Appearance: Well nourished, in no apparent distress.  Eyes: PERRLA, EOMs, conjunctiva no swelling or erythema, normal fundi and vessels.  Sinuses: No Frontal/maxillary tenderness  ENT/Mouth: Ext aud canals clear, normal light reflex with TMs without erythema, bulging. Good dentition. No erythema, swelling, or exudate on post pharynx. Tonsils not swollen or erythematous. Hearing normal.  Neck: Supple, thyroid slightly enlarged on right side with multiple bilateral nodules. No bruits  Respiratory: Respiratory effort normal, BS equal bilaterally without rales, rhonchi, wheezing or stridor.  Cardio: RRR without murmurs, rubs or gallops. Brisk peripheral pulses without edema.  Chest: symmetric, with normal excursions and percussion.  Breasts: + scar tissue on left upper outer quadrant from previous lumpectomy, without lumps, nipple discharge, retractions.  Abdomen: Soft, nontender, no guarding, rebound, hernias, masses,  or organomegaly.  Lymphatics: Non tender without lymphadenopathy.  Genitourinary: defer Musculoskeletal: Full ROM all peripheral extremities,5/5 strength, and normal gait.  Skin: Warm, dry without rashes, lesions, ecchymosis. Neuro: Cranial nerves intact, reflexes equal bilaterally. Normal muscle tone, no cerebellar symptoms. Sensation intact.  Psych: Awake and oriented X 3, normal affect, Insight and Judgment appropriate.   EKG: WNL no changes.  Vicie Mutters 1:57 PM [facility]

## 2016-09-24 ENCOUNTER — Ambulatory Visit (INDEPENDENT_AMBULATORY_CARE_PROVIDER_SITE_OTHER): Payer: Medicare Other | Admitting: Physician Assistant

## 2016-09-24 ENCOUNTER — Encounter: Payer: Self-pay | Admitting: Physician Assistant

## 2016-09-24 VITALS — BP 118/70 | HR 90 | Temp 98.1°F | Resp 14 | Ht 61.5 in | Wt 111.8 lb

## 2016-09-24 DIAGNOSIS — F325 Major depressive disorder, single episode, in full remission: Secondary | ICD-10-CM

## 2016-09-24 DIAGNOSIS — R7309 Other abnormal glucose: Secondary | ICD-10-CM

## 2016-09-24 DIAGNOSIS — E7800 Pure hypercholesterolemia, unspecified: Secondary | ICD-10-CM

## 2016-09-24 DIAGNOSIS — Z0000 Encounter for general adult medical examination without abnormal findings: Secondary | ICD-10-CM | POA: Diagnosis not present

## 2016-09-24 DIAGNOSIS — Z0001 Encounter for general adult medical examination with abnormal findings: Secondary | ICD-10-CM

## 2016-09-24 DIAGNOSIS — M8580 Other specified disorders of bone density and structure, unspecified site: Secondary | ICD-10-CM

## 2016-09-24 DIAGNOSIS — Z136 Encounter for screening for cardiovascular disorders: Secondary | ICD-10-CM

## 2016-09-24 DIAGNOSIS — Z79899 Other long term (current) drug therapy: Secondary | ICD-10-CM

## 2016-09-24 DIAGNOSIS — G4700 Insomnia, unspecified: Secondary | ICD-10-CM

## 2016-09-24 DIAGNOSIS — R002 Palpitations: Secondary | ICD-10-CM

## 2016-09-24 DIAGNOSIS — E559 Vitamin D deficiency, unspecified: Secondary | ICD-10-CM

## 2016-09-24 DIAGNOSIS — I10 Essential (primary) hypertension: Secondary | ICD-10-CM | POA: Diagnosis not present

## 2016-09-24 DIAGNOSIS — K219 Gastro-esophageal reflux disease without esophagitis: Secondary | ICD-10-CM

## 2016-09-24 LAB — CBC WITH DIFFERENTIAL/PLATELET
BASOS PCT: 0 %
Basophils Absolute: 0 cells/uL (ref 0–200)
EOS ABS: 120 {cells}/uL (ref 15–500)
EOS PCT: 2 %
HCT: 40.3 % (ref 35.0–45.0)
Hemoglobin: 13.5 g/dL (ref 11.7–15.5)
Lymphocytes Relative: 32 %
Lymphs Abs: 1920 cells/uL (ref 850–3900)
MCH: 31.7 pg (ref 27.0–33.0)
MCHC: 33.5 g/dL (ref 32.0–36.0)
MCV: 94.6 fL (ref 80.0–100.0)
MONOS PCT: 6 %
MPV: 9 fL (ref 7.5–12.5)
Monocytes Absolute: 360 cells/uL (ref 200–950)
Neutro Abs: 3600 cells/uL (ref 1500–7800)
Neutrophils Relative %: 60 %
PLATELETS: 277 10*3/uL (ref 140–400)
RBC: 4.26 MIL/uL (ref 3.80–5.10)
RDW: 13.5 % (ref 11.0–15.0)
WBC: 6 10*3/uL (ref 3.8–10.8)

## 2016-09-24 LAB — BASIC METABOLIC PANEL WITH GFR
BUN: 19 mg/dL (ref 7–25)
CHLORIDE: 104 mmol/L (ref 98–110)
CO2: 25 mmol/L (ref 20–31)
Calcium: 9.6 mg/dL (ref 8.6–10.4)
Creat: 0.72 mg/dL (ref 0.50–0.99)
GFR, Est African American: >89 mL/min (ref >=60)
GFR, Est Non African American: 86 mL/min (ref >=60)
Glucose, Bld: 83 mg/dL (ref 65–99)
POTASSIUM: 4 mmol/L (ref 3.5–5.3)
Sodium: 139 mmol/L (ref 135–146)

## 2016-09-24 LAB — LIPID PANEL
Cholesterol: 244 mg/dL — ABNORMAL HIGH (ref <200)
HDL: 41 mg/dL — AB (ref >50)
LDL CALC: 157 mg/dL — AB (ref <100)
Total CHOL/HDL Ratio: 6 Ratio — ABNORMAL HIGH (ref <5.0)
Triglycerides: 231 mg/dL — ABNORMAL HIGH (ref <150)
VLDL: 46 mg/dL — ABNORMAL HIGH (ref <30)

## 2016-09-24 LAB — HEPATIC FUNCTION PANEL
ALK PHOS: 93 U/L (ref 33–130)
ALT: 13 U/L (ref 6–29)
AST: 15 U/L (ref 10–35)
Albumin: 4.4 g/dL (ref 3.6–5.1)
BILIRUBIN DIRECT: 0.1 mg/dL (ref <=0.2)
BILIRUBIN INDIRECT: 0.4 mg/dL (ref 0.2–1.2)
BILIRUBIN TOTAL: 0.5 mg/dL (ref 0.2–1.2)
Total Protein: 6.9 g/dL (ref 6.1–8.1)

## 2016-09-24 LAB — TSH: TSH: 2.3 mIU/L

## 2016-09-24 NOTE — Patient Instructions (Addendum)
Benefiber is good for constipation/diarrhea/irritable bowel syndrome, it helps with weight loss and can help lower your bad cholesterol. Please do 1 TBSP in the morning in water, coffee, or tea. It can take up to a month before you can see a difference with your bowel movements. It is cheapest from [employer], [employer], [employer].    Cholesterol Cholesterol is a white, waxy, fat-like substance that is needed by the human body in small amounts. The liver makes all the cholesterol we need. Cholesterol is carried from the liver by the blood through the blood vessels. Deposits of cholesterol (plaques) may build up on blood vessel (artery) walls. Plaques make the arteries narrower and stiffer. Cholesterol plaques increase the risk for heart attack and stroke. You cannot feel your cholesterol level even if it is very high. The only way to know that it is high is to have a blood test. Once you know your cholesterol levels, you should keep a record of the test results. Work with your health care provider to keep your levels in the desired range. What do the results mean?  Total cholesterol is a rough measure of all the cholesterol in your blood.  LDL (low-density lipoprotein) is the "bad" cholesterol. This is the type that causes plaque to build up on the artery walls. You want this level to be low.  HDL (high-density lipoprotein) is the "good" cholesterol because it cleans the arteries and carries the LDL away. You want this level to be high.  Triglycerides are fat that the body can either burn for energy or store. High levels are closely linked to heart disease. What are the desired levels of cholesterol?  Total cholesterol below 200.  LDL below 100 for people who are at risk, below 70 for people at very high risk.  HDL above 40 is good. A level of 60 or higher is considered to be protective against heart disease.  Triglycerides below 150. How can I lower my cholesterol? Diet Follow your diet program as  told by your health care provider.  Choose fish or white meat chicken and [place], roasted or baked. Limit fatty cuts of red meat, fried foods, and processed meats, such as sausage and lunch meats.  Eat lots of fresh fruits and vegetables.  Choose whole grains, beans, pasta, potatoes, and cereals.  Choose olive oil, corn oil, or canola oil, and use only small amounts.  Avoid butter, mayonnaise, shortening, or palm kernel oils.  Avoid foods with trans fats.  Drink skim or nonfat milk and eat low-fat or nonfat yogurt and cheeses. Avoid whole milk, cream, ice cream, egg yolks, and full-fat cheeses.  Healthier desserts include angel food cake, ginger snaps, animal crackers, hard candy, popsicles, and low-fat or nonfat frozen yogurt. Avoid pastries, cakes, pies, and cookies.  Exercise  Follow your exercise program as told by your health care provider. A regular program: ? Helps to decrease LDL and raise HDL. ? Helps with weight control.  Do things that increase your activity level, such as gardening, walking, and taking the stairs.  Ask your health care provider about ways that you can be more active in your daily life.  Medicine  Take over-the-counter and prescription medicines only as told by your health care provider. ? Medicine may be prescribed by your health care provider to help lower cholesterol and decrease the risk for heart disease. This is usually done if diet and exercise have failed to bring down cholesterol levels. ? If you have several risk factors, you  may need medicine even if your levels are normal.  This information is not intended to replace advice given to you by your health care provider. Make sure you discuss any questions you have with your health care provider. Document Released: 12/18/2000 Document Revised: 10/21/2015 Document Reviewed: 09/23/2015 Elsevier Interactive Patient Education  2017 [place] for Gastroesophageal Reflux Disease,  Adult When you have gastroesophageal reflux disease (GERD), the foods you eat and your eating habits are very important. Choosing the right foods can help ease your discomfort. What guidelines do I need to follow?  Choose fruits, vegetables, whole grains, and low-fat dairy products.  Choose low-fat meat, fish, and poultry.  Limit fats such as oils, salad dressings, butter, nuts, and avocado.  Keep a food diary. This helps you identify foods that cause symptoms.  Avoid foods that cause symptoms. These may be different for everyone.  Eat small meals often instead of 3 large meals a day.  Eat your meals slowly, in a place where you are relaxed.  Limit fried foods.  Cook foods using methods other than frying.  Avoid drinking alcohol.  Avoid drinking large amounts of liquids with your meals.  Avoid bending over or lying down until 2-3 hours after eating. What foods are not recommended? These are some foods and drinks that may make your symptoms worse: Vegetables Tomatoes. Tomato juice. Tomato and spaghetti sauce. Chili peppers. Onion and garlic. Horseradish. Fruits Oranges, grapefruit, and lemon (fruit and juice). Meats High-fat meats, fish, and poultry. This includes hot dogs, ribs, ham, sausage, salami, and bacon. Dairy Whole milk and chocolate milk. Sour cream. Cream. Butter. Ice cream. Cream cheese. Drinks Coffee and tea. Bubbly (carbonated) drinks or energy drinks. Condiments Hot sauce. Barbecue sauce. Sweets/Desserts Chocolate and cocoa. Donuts. Peppermint and spearmint. Fats and Oils High-fat foods. This includes [place] fries and potato chips. Other Vinegar. Strong spices. This includes black pepper, white pepper, red pepper, cayenne, curry powder, cloves, ginger, and chili powder. The items listed above may not be a complete list of foods and drinks to avoid. Contact your dietitian for more information. This information is not intended to replace advice given to you  by your health care provider. Make sure you discuss any questions you have with your health care provider. Document Released: 09/24/2011 Document Revised: 08/31/2015 Document Reviewed: 01/27/2013 Elsevier Interactive Patient Education  2017 [employer].

## 2016-09-25 LAB — URINALYSIS, MICROSCOPIC ONLY
Bacteria, UA: NONE SEEN [HPF]
Casts: NONE SEEN [LPF]
Crystals: NONE SEEN [HPF]
YEAST: NONE SEEN [HPF]

## 2016-09-25 LAB — URINALYSIS, ROUTINE W REFLEX MICROSCOPIC
Bilirubin Urine: NEGATIVE
Glucose, UA: NEGATIVE
Ketones, ur: NEGATIVE
Nitrite: NEGATIVE
Protein, ur: NEGATIVE
Specific Gravity, Urine: 1.027 (ref 1.001–1.035)
pH: 5 (ref 5.0–8.0)

## 2016-09-25 LAB — MAGNESIUM: Magnesium: 2.1 mg/dL (ref 1.5–2.5)

## 2016-09-25 LAB — MICROALBUMIN / CREATININE URINE RATIO
Creatinine, Urine: 329 mg/dL — ABNORMAL HIGH (ref 20–320)
Microalb Creat Ratio: 5 ug/mg{creat} (ref <30)
Microalb, Ur: 1.8 mg/dL

## 2016-09-25 NOTE — Progress Notes (Signed)
LVM for pt to return office call for LAB results.

## 2016-09-25 NOTE — Progress Notes (Signed)
Pt aware of lab results & voiced understanding of those results.

## 2016-09-30 ENCOUNTER — Other Ambulatory Visit: Payer: Self-pay | Admitting: Internal Medicine

## 2016-09-30 ENCOUNTER — Telehealth: Payer: Self-pay

## 2016-09-30 NOTE — Telephone Encounter (Signed)
-----   Message from Vicie Mutters, [place] sent at 09/30/2016  5:56 PM EDT ----- Regarding: RE: question Colonoscopy due 2024 and DEXA next year ----- Message ----- From: Elenor Quinones, CMA Sent: 09/30/2016   3:32 PM To: Vicie Mutters, PA-C Subject: question                                       Pt had questions about if you wanted her to get a bone density & colonoscopy done. Please advise.

## 2016-09-30 NOTE — Telephone Encounter (Signed)
Pt informed of Colonoscopy due 2024 and DEXA next year

## 2016-09-30 NOTE — Telephone Encounter (Signed)
PT INFORMED OF USE OF XANAX (PRN) NOT DAILY PT AGREED & HUNG UP.

## 2016-09-30 NOTE — Telephone Encounter (Signed)
Xanax called into pharmacy @ 3:02pm on 25th June 2018 by DD

## 2016-09-30 NOTE — Telephone Encounter (Signed)
LVM for pt to return office call in order to inform of PRN use of xanax.

## 2016-10-03 ENCOUNTER — Other Ambulatory Visit: Payer: Self-pay | Admitting: Physician Assistant

## 2016-10-03 ENCOUNTER — Ambulatory Visit (HOSPITAL_COMMUNITY)
Admission: RE | Admit: 2016-10-03 | Discharge: 2016-10-03 | Disposition: A | Discharge status: Home / Self Care | Payer: Medicare Other | Source: Ambulatory Visit | Attending: Physician Assistant | Admitting: Physician Assistant

## 2016-10-03 ENCOUNTER — Telehealth: Payer: Self-pay

## 2016-10-03 DIAGNOSIS — I700 Atherosclerosis of aorta: Secondary | ICD-10-CM | POA: Insufficient documentation

## 2016-10-03 DIAGNOSIS — I10 Essential (primary) hypertension: Secondary | ICD-10-CM

## 2016-10-03 DIAGNOSIS — J439 Emphysema, unspecified: Secondary | ICD-10-CM | POA: Insufficient documentation

## 2016-10-03 NOTE — Telephone Encounter (Signed)
-----   Message from Vicie Mutters, [place] sent at 10/03/2016  3:15 PM EDT ----- Regarding: RE: question She has had COPD, as long as she is not smoking the COPD should not progress and make sure she has her pneumonia vaccines. If she has shortness of breath we can do inhalers, but there is nothing else to do.  Can discuss more at next [facility] ----- Message ----- From: Elenor Quinones, CMA Sent: 10/03/2016   2:14 PM To: Vicie Mutters, PA-C Subject: question                                       Pt would like to know what she needs to do next now that the CXR came back + COPD. Please advise

## 2016-10-03 NOTE — Telephone Encounter (Signed)
Pt  Informed of instructions on what she should be doing for her COPD. Not to smoke, in any SOB can start on inhaler. Pt seemed happy with this & hung up.

## 2016-10-03 NOTE — Progress Notes (Signed)
Pt aware of lab results & voiced understanding of those results.

## 2016-10-23 ENCOUNTER — Other Ambulatory Visit: Payer: Self-pay | Admitting: Internal Medicine

## 2016-10-23 ENCOUNTER — Other Ambulatory Visit: Payer: Self-pay | Admitting: Family Medicine

## 2016-10-23 DIAGNOSIS — Z1231 Encounter for screening mammogram for malignant neoplasm of breast: Secondary | ICD-10-CM

## 2016-11-04 ENCOUNTER — Ambulatory Visit
Admission: RE | Admit: 2016-11-04 | Discharge: 2016-11-04 | Disposition: A | Discharge status: Home / Self Care | Payer: Medicare Other | Source: Ambulatory Visit | Attending: Internal Medicine | Admitting: Internal Medicine

## 2016-11-04 DIAGNOSIS — Z1231 Encounter for screening mammogram for malignant neoplasm of breast: Secondary | ICD-10-CM

## 2016-11-04 HISTORY — DX: Personal history of irradiation: Z92.3

## 2016-12-05 ENCOUNTER — Other Ambulatory Visit: Payer: Self-pay | Admitting: Physician Assistant

## 2016-12-05 NOTE — Telephone Encounter (Signed)
Please call Alpraz  

## 2016-12-06 ENCOUNTER — Other Ambulatory Visit: Payer: Self-pay | Admitting: Physician Assistant

## 2016-12-06 NOTE — Telephone Encounter (Signed)
RX called to [facility]

## 2016-12-06 NOTE — Telephone Encounter (Signed)
Please call Alpraz  

## 2017-01-20 ENCOUNTER — Other Ambulatory Visit: Payer: Self-pay | Admitting: Internal Medicine

## 2017-01-20 NOTE — Telephone Encounter (Signed)
Please call Alpraz  

## 2017-02-18 ENCOUNTER — Other Ambulatory Visit: Payer: Self-pay | Admitting: Physician Assistant

## 2017-02-18 DIAGNOSIS — N3010 Interstitial cystitis (chronic) without hematuria: Secondary | ICD-10-CM

## 2017-03-18 ENCOUNTER — Other Ambulatory Visit: Payer: Self-pay | Admitting: Internal Medicine

## 2017-03-26 ENCOUNTER — Ambulatory Visit (INDEPENDENT_AMBULATORY_CARE_PROVIDER_SITE_OTHER): Payer: Medicare Other | Admitting: Internal Medicine

## 2017-03-26 VITALS — BP 122/76 | HR 68 | Temp 97.6°F | Resp 16 | Ht 61.5 in | Wt 114.2 lb

## 2017-03-26 DIAGNOSIS — E559 Vitamin D deficiency, unspecified: Secondary | ICD-10-CM | POA: Diagnosis not present

## 2017-03-26 DIAGNOSIS — I10 Essential (primary) hypertension: Secondary | ICD-10-CM

## 2017-03-26 DIAGNOSIS — R7309 Other abnormal glucose: Secondary | ICD-10-CM | POA: Diagnosis not present

## 2017-03-26 DIAGNOSIS — E782 Mixed hyperlipidemia: Secondary | ICD-10-CM | POA: Diagnosis not present

## 2017-03-26 DIAGNOSIS — Z79899 Other long term (current) drug therapy: Secondary | ICD-10-CM | POA: Diagnosis not present

## 2017-03-26 NOTE — Patient Instructions (Signed)

## 2017-03-26 NOTE — Progress Notes (Signed)
This very nice 71 y.o.  DWF presents for 3 month follow up with hx/o labile Hypertension, Hyperlipidemia, Pre-Diabetes and Vitamin D Deficiency.      Patient is monitored expectantly for HTN circa 2000 & BP has been controlled at home. Today's BP is at goal - 122/76. Patient has had no complaints of any cardiac type chest pain, palpitations, dyspnea / orthopnea / PND, dizziness, claudication, or dependent edema.     Hyperlipidemia is not  controlled with diet & she is off of Lipid meds. Patient denies myalgias or other med SE's. Last Lipids were not at goal with patient off of  her medications.  Lab Results  Component Value Date   CHOL 244 (H) 09/24/2016   HDL 41 (L) 09/24/2016   LDLCALC 157 (H) 09/24/2016   TRIG 231 (H) 09/24/2016   CHOLHDL 6.0 (H) 09/24/2016      Also, the patient has history of PreDiabetes (A1c 5.7% May 2013) and has had no symptoms of reactive hypoglycemia, diabetic polys, paresthesias or visual blurring.  Last A1c was at goal: Lab Results  Component Value Date   HGBA1C 5.6 09/12/2015      Further, the patient also has history of Vitamin D Deficiency ("42" on treatment in 2011) and supplements vitamin D sporadically. Last vitamin D was still relatively low and not at goal (70-100):  Lab Results  Component Value Date   VD25OH 48 09/12/2015   Current Outpatient Medications on File Prior to Visit  Medication Sig  . ALPRAZolam (XANAX) 0.5 MG tablet Take 1/2 to 1 tablet 2 to 3 x / day only if needed for acute anxiety attack and please try to limit to 5 days /week to avoid addiction  . amitriptyline (ELAVIL) 10 MG tablet TAKE 1 TO 2 TABLETS BY MOUTH AT NIGHT FOR SLEEP  . cholecalciferol (VITAMIN D) 1000 units tablet Take 1,000 Units by mouth daily.  . citalopram (CELEXA) 40 MG tablet TAKE 1/2 TO 1 TABLET BY MOUTH ONCE DAILY FOR MOOD AS DIRECTED ((INSURANCE MAX 30))  . Magnesium 250 MG TABS Take 250 mg by mouth daily.  . Multiple Vitamin (MULTIVITAMIN) tablet Take 1  tablet by mouth daily.  Marland Kitchen NIACIN CR PO Take by mouth daily.   No current facility-administered medications on file prior to visit.    Allergies  Allergen Reactions  . Codeine   . Decongestant [Pseudoephedrine Hcl]    PMHx:   Past Medical History:  Diagnosis Date  . Anxiety   . Breast cancer ([place])    left   . Depressive disorder, not elsewhere classified   . Diverticulosis of colon (without mention of hemorrhage) 2007   Colonoscopy  . Esophagitis, unspecified 2001   EGD  . GERD (gastroesophageal reflux disease)   . Hx of colonic polyps 2003   Colonoscopy   . IBS (irritable bowel syndrome)   . Insomnia, unspecified   . Internal hemorrhoids without mention of complication 5465   Colonoscopy   . Personal history of radiation therapy 04/08/2004  . Pure hypercholesterolemia   . Stricture and stenosis of esophagus 2001, 2007   EGD  . Unspecified essential hypertension     There is no immunization history on file for this patient. Past Surgical History:  Procedure Laterality Date  . ABDOMINAL HYSTERECTOMY    . BREAST LUMPECTOMY  2005   left  . CHOLECYSTECTOMY  2004  . NASAL SINUS SURGERY     FHx:    Reviewed / unchanged  SHx:  Reviewed / unchanged   Systems Review:  Constitutional: Denies fever, chills, wt changes, headaches, insomnia, fatigue, night sweats, change in appetite. Eyes: Denies redness, blurred vision, diplopia, discharge, itchy, watery eyes.  ENT: Denies discharge, congestion, post nasal drip, epistaxis, sore throat, earache, hearing loss, dental pain, tinnitus, vertigo, sinus pain, snoring.  CV: Denies chest pain, palpitations, irregular heartbeat, syncope, dyspnea, diaphoresis, orthopnea, PND, claudication or edema. Respiratory: denies cough, dyspnea, DOE, pleurisy, hoarseness, laryngitis, wheezing.  Gastrointestinal: Denies dysphagia, odynophagia, heartburn, reflux, water brash, abdominal pain or cramps, nausea, vomiting, bloating, diarrhea,  constipation, hematemesis, melena, hematochezia  or hemorrhoids. Genitourinary: Denies dysuria, frequency, urgency, nocturia, hesitancy, discharge, hematuria or flank pain. Musculoskeletal: Denies arthralgias, myalgias, stiffness, jt. swelling, pain, limping or strain/sprain.  Skin: Denies pruritus, rash, hives, warts, acne, eczema or change in skin lesion(s). Neuro: No weakness, tremor, incoordination, spasms, paresthesia or pain. Psychiatric: Denies confusion, memory loss or sensory loss. Endo: Denies change in weight, skin or hair change.  Heme/Lymph: No excessive bleeding, bruising or enlarged lymph nodes.  Physical Exam  BP 122/76   Pulse 68   Temp 97.6 F (36.4 C)   Resp 16   Ht 5' 1.5" (1.562 m)   Wt 114 lb 3.2 oz (51.8 kg)   BMI 21.23 kg/m   Appears well nourished, well groomed  and in no distress.  Eyes: PERRLA, EOMs, conjunctiva no swelling or erythema. Sinuses: No frontal/maxillary tenderness ENT/Mouth: EAC's clear, TM's nl w/o erythema, bulging. Nares clear w/o erythema, swelling, exudates. Oropharynx clear without erythema or exudates. Oral hygiene is good. Tongue normal, non obstructing. Hearing intact.  Neck: Supple. Thyroid nl. Car 2+/2+ without bruits, nodes or JVD. Chest: Respirations nl with BS clear & equal w/o rales, rhonchi, wheezing or stridor.  Cor: Heart sounds normal w/ regular rate and rhythm without sig. murmurs, gallops, clicks or rubs. Peripheral pulses normal and equal  without edema.  Abdomen: Soft & bowel sounds normal. Non-tender w/o guarding, rebound, hernias, masses or organomegaly.  Lymphatics: Unremarkable.  Musculoskeletal: Full ROM all peripheral extremities, joint stability, 5/5 strength and normal gait.  Skin: Warm, dry without exposed rashes, lesions or ecchymosis apparent.  Neuro: Cranial nerves intact, reflexes equal bilaterally. Sensory-motor testing grossly intact. Tendon reflexes grossly intact.  Pysch: Alert & oriented x 3.  Insight  and judgement nl & appropriate. No ideations.  Assessment and Plan:  1. Essential hypertension  - Continue medication, monitor blood pressure at home.  - Continue DASH diet. Reminder to go to the ER if any CP,  SOB, nausea, dizziness, severe HA, changes vision/speech. - CBC with Differential/Platelet - BASIC METABOLIC PANEL WITH GFR - Magnesium - TSH  2. Hyperlipidemia, mixed  - Continue diet/meds, exercise,& lifestyle modifications.  - Continue monitor periodic cholesterol/liver & renal functions   - Hepatic function panel - Lipid panel - TSH  3. Abnormal glucose  - Continue diet, exercise, lifestyle modifications.  - Monitor appropriate labs.  - Hemoglobin A1c - Insulin, random  4. Vitamin D deficiency  - Continue supplementation.  - VITAMIN D 25 Hydroxy  5. Medication management  - CBC with Differential/Platelet - BASIC METABOLIC PANEL WITH GFR - Hepatic function panel - Magnesium - Lipid panel - TSH - Hemoglobin A1c - Insulin, random - VITAMIN D 25 Hydroxy        Discussed  regular exercise, BP monitoring, weight control to achieve/maintain BMI less than 25 and discussed med and SE's. Recommended labs to assess and monitor clinical status with further disposition pending results of labs.  Over 30 minutes of exam, counseling, chart review was performed.

## 2017-03-27 ENCOUNTER — Other Ambulatory Visit: Payer: Self-pay | Admitting: Internal Medicine

## 2017-03-27 LAB — HEPATIC FUNCTION PANEL
AG Ratio: 1.8 (calc) (ref 1.0–2.5)
ALKALINE PHOSPHATASE (APISO): 123 U/L (ref 33–130)
ALT: 18 U/L (ref 6–29)
AST: 23 U/L (ref 10–35)
Albumin: 4.6 g/dL (ref 3.6–5.1)
BILIRUBIN INDIRECT: 0.6 mg/dL (ref 0.2–1.2)
Bilirubin, Direct: 0.1 mg/dL (ref 0.0–0.2)
Globulin: 2.6 g/dL (calc) (ref 1.9–3.7)
TOTAL PROTEIN: 7.2 g/dL (ref 6.1–8.1)
Total Bilirubin: 0.7 mg/dL (ref 0.2–1.2)

## 2017-03-27 LAB — CBC WITH DIFFERENTIAL/PLATELET
Basophils Absolute: 49 cells/uL (ref 0–200)
Basophils Relative: 0.9 %
EOS PCT: 3.6 %
Eosinophils Absolute: 194 cells/uL (ref 15–500)
HCT: 42.3 % (ref 35.0–45.0)
Hemoglobin: 14.3 g/dL (ref 11.7–15.5)
Lymphs Abs: 1706 cells/uL (ref 850–3900)
MCH: 31.2 pg (ref 27.0–33.0)
MCHC: 33.8 g/dL (ref 32.0–36.0)
MCV: 92.2 fL (ref 80.0–100.0)
MPV: 9.3 fL (ref 7.5–12.5)
Monocytes Relative: 7.9 %
NEUTROS PCT: 56 %
Neutro Abs: 3024 cells/uL (ref 1500–7800)
PLATELETS: 303 10*3/uL (ref 140–400)
RBC: 4.59 10*6/uL (ref 3.80–5.10)
RDW: 12.7 % (ref 11.0–15.0)
TOTAL LYMPHOCYTE: 31.6 %
WBC mixed population: 427 cells/uL (ref 200–950)
WBC: 5.4 10*3/uL (ref 3.8–10.8)

## 2017-03-27 LAB — BASIC METABOLIC PANEL WITH GFR
BUN: 13 mg/dL (ref 7–25)
CHLORIDE: 102 mmol/L (ref 98–110)
CO2: 30 mmol/L (ref 20–32)
Calcium: 10 mg/dL (ref 8.6–10.4)
Creat: 0.67 mg/dL (ref 0.60–0.93)
GFR, Est African American: 103 mL/min/{1.73_m2} (ref >=60)
GFR, Est Non African American: 89 mL/min/{1.73_m2} (ref >=60)
GLUCOSE: 86 mg/dL (ref 65–99)
POTASSIUM: 5 mmol/L (ref 3.5–5.3)
SODIUM: 140 mmol/L (ref 135–146)

## 2017-03-27 LAB — MAGNESIUM: Magnesium: 2.2 mg/dL (ref 1.5–2.5)

## 2017-03-27 LAB — VITAMIN D 25 HYDROXY (VIT D DEFICIENCY, FRACTURES): Vit D, 25-Hydroxy: 41 ng/mL (ref 30–100)

## 2017-03-27 LAB — HEMOGLOBIN A1C
EAG (MMOL/L): 6.2 (calc)
Hgb A1c MFr Bld: 5.5 % of total Hgb (ref <5.7)
MEAN PLASMA GLUCOSE: 111 (calc)

## 2017-03-27 LAB — LIPID PANEL
CHOL/HDL RATIO: 5.1 (calc) — AB (ref <5.0)
CHOLESTEROL: 253 mg/dL — AB (ref <200)
HDL: 50 mg/dL — AB (ref >50)
LDL Cholesterol (Calc): 167 mg/dL (calc) — ABNORMAL HIGH
Non-HDL Cholesterol (Calc): 203 mg/dL (calc) — ABNORMAL HIGH (ref <130)
Triglycerides: 202 mg/dL — ABNORMAL HIGH (ref <150)

## 2017-03-27 LAB — INSULIN, RANDOM: Insulin: 3.7 u[IU]/mL (ref 2.0–19.6)

## 2017-03-27 LAB — TSH: TSH: 2.06 mIU/L (ref 0.40–4.50)

## 2017-03-27 MED ORDER — ROSUVASTATIN CALCIUM 40 MG PO TABS: ORAL_TABLET | ORAL | 1 refills | Status: DC

## 2017-03-31 ENCOUNTER — Encounter: Payer: Self-pay | Admitting: Internal Medicine

## 2017-03-31 MED ORDER — CITALOPRAM HYDROBROMIDE 40 MG PO TABS: ORAL_TABLET | ORAL | 1 refills | Status: DC

## 2017-05-01 ENCOUNTER — Other Ambulatory Visit: Payer: Self-pay | Admitting: Internal Medicine

## 2017-06-13 ENCOUNTER — Other Ambulatory Visit: Payer: Self-pay | Admitting: Internal Medicine

## 2017-07-08 IMAGING — CR DG CHEST 2V
2 series · 2 of 2 positions shown · non-contrast
Comparison: 08/31/2013 chest radiograph.

CLINICAL DATA: Smoking history.  General adult medical examination.

EXAM:
CHEST  2 VIEW

[chest pa]
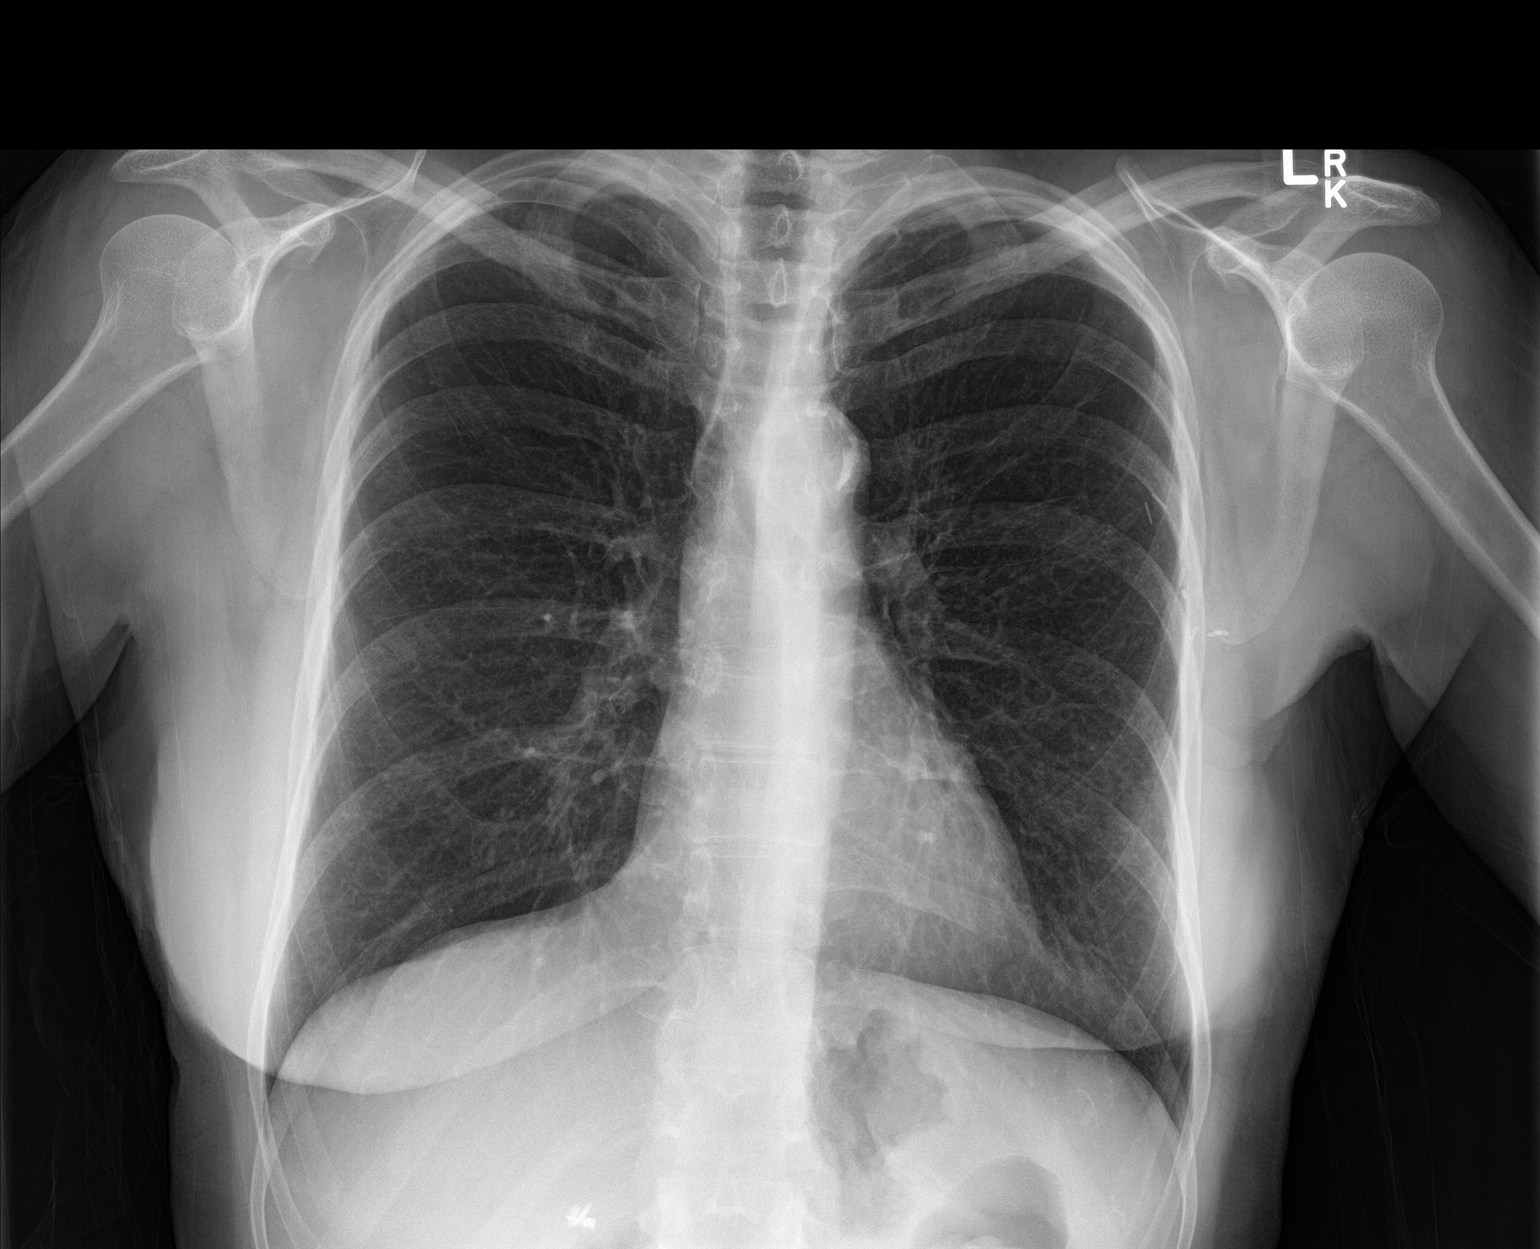

[chest lat]
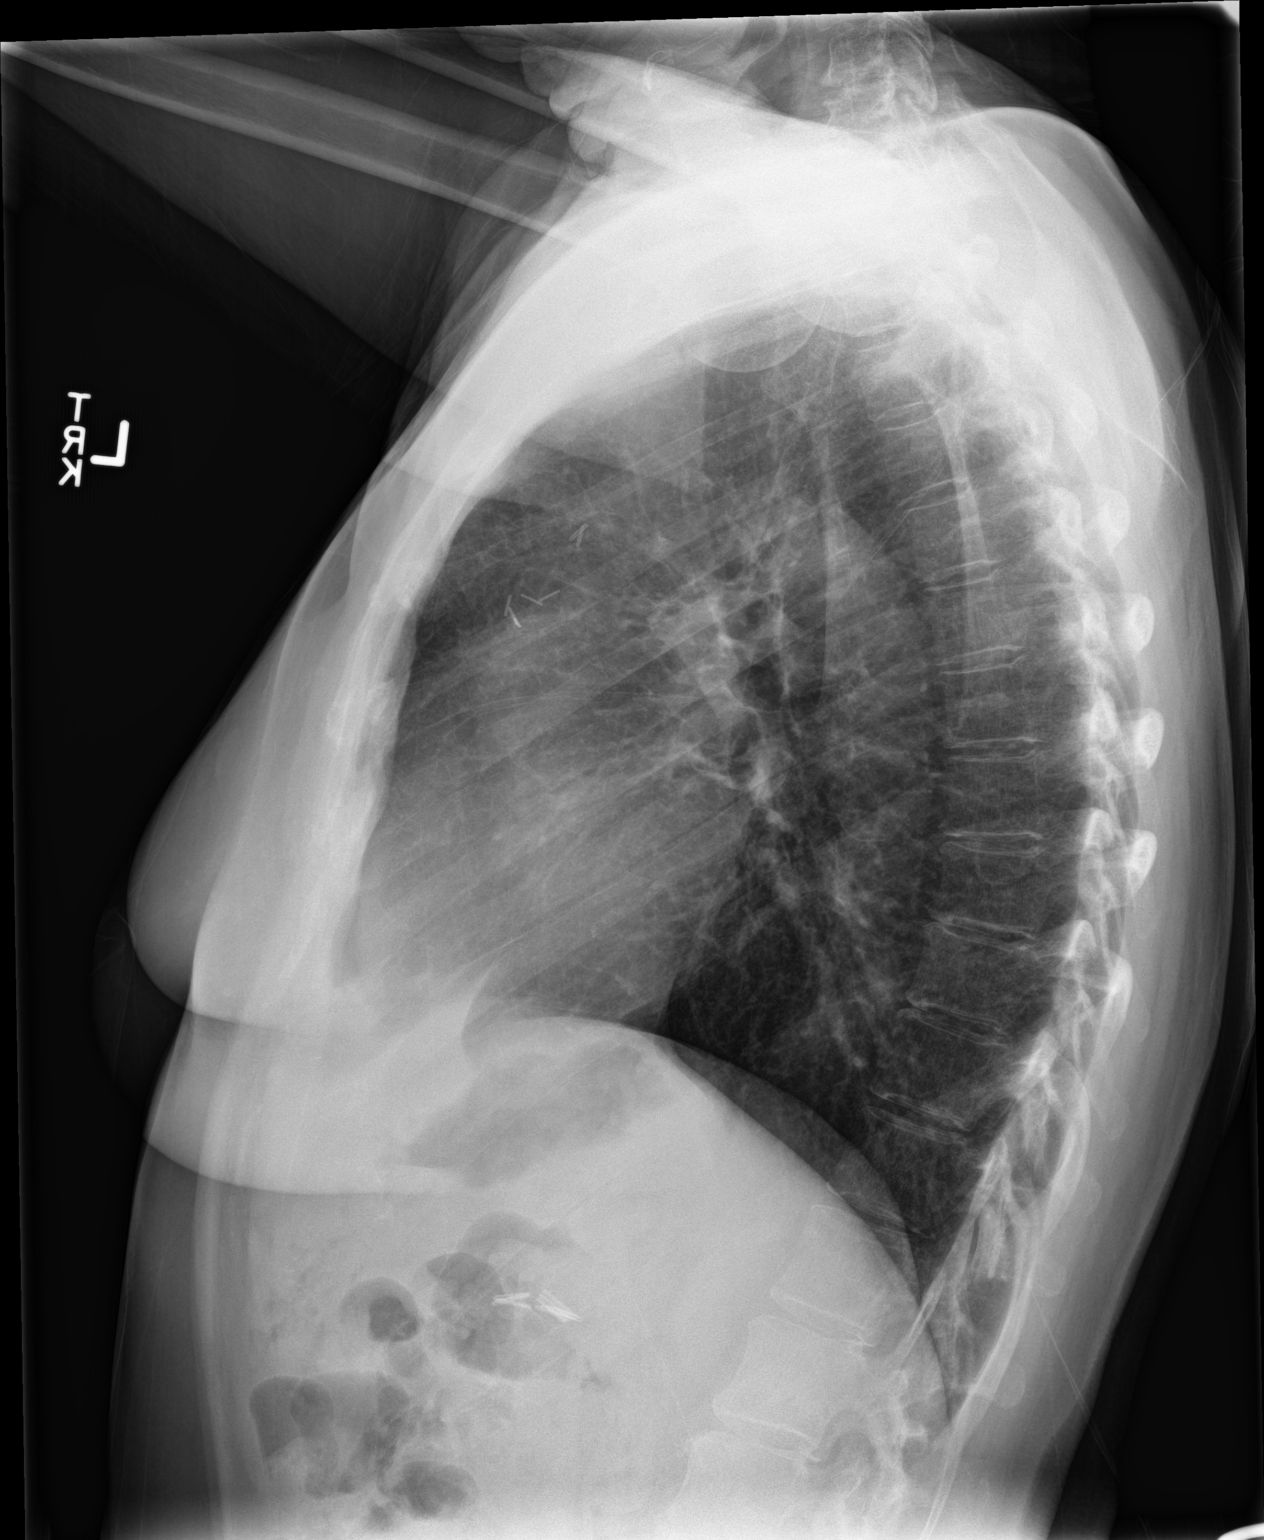

[2 of 2 positions shown; findings below may reference images not displayed]

FINDINGS: Stable cardiomediastinal silhouette with normal heart size. No
pneumothorax. No pleural effusion. No pulmonary edema. No acute
consolidative airspace disease. No significant lung hyperinflation.
Left axillary surgical clips. Cholecystectomy clips are seen in the
right upper quadrant of the abdomen.
IMPRESSION: No active cardiopulmonary disease.

## 2017-08-06 ENCOUNTER — Other Ambulatory Visit: Payer: Self-pay | Admitting: Physician Assistant

## 2017-08-15 ENCOUNTER — Other Ambulatory Visit: Payer: Self-pay | Admitting: Physician Assistant

## 2017-09-23 DIAGNOSIS — J439 Emphysema, unspecified: Secondary | ICD-10-CM | POA: Insufficient documentation

## 2017-09-23 DIAGNOSIS — I700 Atherosclerosis of aorta: Secondary | ICD-10-CM | POA: Insufficient documentation

## 2017-09-23 NOTE — Progress Notes (Signed)
MEDICARE ANNUAL WELLNESS VISIT AND FOLLOW UP  Assessment:    Diagnoses and all orders for this visit:  Encounter for Medicare annual wellness exam  Aortic atherosclerosis ([place]) Control blood pressure, cholesterol, glucose, increase exercise.   Pulmonary emphysema, unspecified emphysema type ([place]) By imaging, monitor - CXR today  Essential hypertension Continue medications Monitor blood pressure at home; call if consistently over 130/80 Continue DASH diet.   Reminder to go to the ER if any CP, SOB, nausea, dizziness, severe HA, changes vision/speech, left arm numbness and tingling and jaw pain.  Gastroesophageal reflux disease without esophagitis Well managed on current medications Discussed diet, avoiding triggers and other lifestyle changes  Osteopenia, unspecified location Emphasized high impact/weight bearing exercise, calcium, vitamin D DEXA ordered  Vitamin D deficiency Continue supplementation Check vitamin D level   Major depression in remission ([place]) Lacking energy, celexa ? Benefit, will try lexapro Lifestyle discussed: diet/exerise, sleep hygiene, stress management, hydration  Insomnia, unspecified type  Reminded to limit use of benzo, <5 days/week good sleep hygiene discussed, increase day time activity  HYPERCHOLESTEROLEMIA Refuses statin Continue low cholesterol diet and exercise.  Check lipid panel.   Abnormal glucose Recent A1Cs at goal Discussed diet/exercise, weight management  Defer A1C; check BMP  Orders Placed This Encounter  Procedures  . DG Chest 2 View  . DG Bone Density  . MM Digital Screening  . CBC with Differential/Platelet  . COMPLETE METABOLIC PANEL WITH GFR  . Lipid panel  . TSH  . VITAMIN D 25 Hydroxy (Vit-D Deficiency, Fractures)  . Magnesium  . Vitamin B12  . Microalbumin / creatinine urine ratio  . Urinalysis w microscopic + reflex cultur  . Hemoglobin A1c  . POC Hemoccult Bld/Stl (3-Cd Home Screen)  . EKG 12-Lead     Over 40 minutes of exam, counseling, chart review and critical decision making was performed No future appointments.   Plan:   During the course of the visit the patient was educated and counseled about appropriate screening and preventive services including:    Pneumococcal vaccine   Prevnar 13  Influenza vaccine  Td vaccine  Screening electrocardiogram  Bone densitometry screening  Colorectal cancer screening  Diabetes screening  Glaucoma screening  Nutrition counseling   Advanced directives: requested   Subjective:  Samantha [name] is a 72 y.o. female who presents for Medicare Annual Wellness Visit and CPE  Today she presents c/o fatigue for 6-7 months. She reports she has highly fluctuating energy levels, will have a "burst of energy" and will be very productive, but then will had several days where she doesn't feel like doing anything.   She reports she is snoring very loudly at night; she has been seen by ENT who recommended uvulectomy but declined this due to risks. Would like to complete a sleep study.   She is on celexa for depression; feels that she is not doing well on this agent. Discussed at length and she is willing to try an alternate agent.  She uses the xanax at night for insomnia, not during the day.  She has ICS and takes elavil occ.   BMI is Body mass index is 21.19 kg/m., she has not been working on diet and exercise. Wt Readings from Last 3 Encounters:  09/24/17 114 lb (51.7 kg)  03/26/17 114 lb 3.2 oz (51.8 kg)  09/24/16 111 lb 12.8 oz (50.7 kg)   Today their BP is BP: 128/76 She does workout. She denies chest pain, shortness of breath, dizziness.  She is not on cholesterol medication (was prescribed rosuvastatin, refuses to take) and denies myalgias. Her cholesterol is not at goal. The cholesterol last visit was:   Lab Results  Component Value Date   CHOL 253 (H) 03/26/2017   HDL 50 (L) 03/26/2017   LDLCALC 167 (H) 03/26/2017    TRIG 202 (H) 03/26/2017   CHOLHDL 5.1 (H) 03/26/2017    She has been working on diet and exercise for glucose management, and denies foot ulcerations, increased appetite, nausea, paresthesia of the feet, polydipsia, polyuria, visual disturbances, vomiting and weight loss. Last A1C in the office was:  Lab Results  Component Value Date   HGBA1C 5.5 03/26/2017   Last GFR: Lab Results  Component Value Date   GFRNONAA 89 03/26/2017   Patient is on Vitamin D supplement.   Lab Results  Component Value Date   VD25OH 41 03/26/2017      Medication Review: Current Outpatient Medications on File Prior to Visit  Medication Sig Dispense Refill  . ALPRAZolam (XANAX) 0.5 MG tablet Take 1/2 to 1 tablet 2 to 3 x / day ONLY if needed for Anxiety Attack & please try to limit to 5 days /week to avoid addiction 90 tablet 0  . amitriptyline (ELAVIL) 10 MG tablet TAKE 1 TO 2 TABLETS BY MOUTH AT NIGHT FOR SLEEP 60 tablet 1  . cholecalciferol (VITAMIN D) 1000 units tablet Take 1,000 Units by mouth daily.    . citalopram (CELEXA) 40 MG tablet Take 1/2 tablet daily for Mood 90 tablet 1  . citalopram (CELEXA) 40 MG tablet TAKE 1/2 TO 1 TABLET BY MOUTH ONCE DAILY FOR MOOD *MAX ON INSUR* 90 tablet 1  . Magnesium 250 MG TABS Take 250 mg by mouth daily.    . Multiple Vitamin (MULTIVITAMIN) tablet Take 1 tablet by mouth daily.     No current facility-administered medications on file prior to visit.     Allergies  Allergen Reactions  . Codeine   . Decongestant [Pseudoephedrine Hcl]     Current Problems (verified) Patient Active Problem List   Diagnosis Date Noted  . Aortic atherosclerosis ([place]) 09/23/2017  . Emphysema of lung ([place]) 09/23/2017  . Encounter for Medicare annual wellness exam 10/24/2014  . Osteopenia 09/08/2014  . Abnormal glucose 05/04/2014  . Vitamin D deficiency 05/04/2014  . Medication management 05/04/2014  . Esophageal reflux 01/31/2011  . HYPERCHOLESTEROLEMIA 08/15/2009  . Major  depression in remission ([place]) 08/15/2009  . Essential hypertension 08/15/2009  . Insomnia 08/15/2009    Screening Tests  There is no immunization history on file for this patient.   Health Maintenance:   Last colonoscopy: 10/2012 Last mammogram 3D: 10/2016, CAT C, will schedule the next Last pap smear/pelvic exam: 2011 remote, declines another DEXA: 10/2014- osteopenia  Ct chest 2009 CXR 09/2016 US soft tissue head 09/2014  Prior vaccinations: TD or Tdap: 1999 and declines Influenza: declines Pneumococcal: 2000 declines Prevnar 13 declines Shingles/Zostavax: declines due to cost  Names of Other Physician/Practitioners you currently use: 1. [facility]- here for primary care 2. [facility], Dr. Herschel [place], eye doctor, last visit 2019, has left cataract, discussing surgery 3. [facility], dentist, last visit 2018, has appointment next week  Patient Care Team: Unk Pinto, MD as PCP - General (Internal Medicine) Inda Castle, MD (Inactive) as Consulting Physician (Gastroenterology) Irine Seal, MD as Attending Physician (Urology) Crista Luria, MD as Consulting Physician (Dermatology) Magrinat, Virgie Dad, MD as Consulting Physician (Oncology)  SURGICAL HISTORY She  has a past surgical history that includes Abdominal hysterectomy; Cholecystectomy (2004); Breast lumpectomy (2005); and Nasal sinus surgery. FAMILY HISTORY Her family history includes Bipolar disorder in her mother; Breast cancer in her maternal aunt, maternal aunt, maternal aunt, maternal aunt, and mother; Heart disease in her mother; Heart disease (age of onset: 9) in her father; Parkinson's disease in her sister. SOCIAL HISTORY She  reports that she quit smoking about 5 years ago. She has a 14.00 pack-year smoking history. She has never used smokeless tobacco. She reports that she drinks about 0.6 oz of alcohol per week. She reports that she does not use  drugs.   MEDICARE WELLNESS OBJECTIVES: Physical activity: Current Exercise Habits: Home exercise routine, Type of exercise: walking, Time (Minutes): 35, Frequency (Times/Week): 3, Weekly Exercise (Minutes/Week): 105, Intensity: Mild Cardiac risk factors: Cardiac Risk Factors include: advanced age (>50men, >55 women);dyslipidemia;smoking/ tobacco exposure Depression/mood screen:   Depression screen [facility] 2/9 09/24/2017  Decreased Interest 1  Down, Depressed, Hopeless 2  PHQ - 2 Score 3  Altered sleeping 1  Tired, decreased energy 1  Change in appetite 0  Feeling bad or failure about yourself  0  Trouble concentrating 0  Moving slowly or fidgety/restless 0  Suicidal thoughts 0  PHQ-9 Score 5  Difficult doing work/chores Somewhat difficult    ADLs:  In your present state of health, do you have any difficulty performing the following activities: 09/24/2017 03/31/2017  Hearing? N N  Vision? N N  Difficulty concentrating or making decisions? N N  Walking or climbing stairs? N N  Dressing or bathing? N N  Doing errands, shopping? N N  Preparing Food and eating ? N -  Using the Toilet? N -  In the past six months, have you accidently leaked urine? N -  Do you have problems with loss of bowel control? N -  Managing your Medications? N -  Managing your Finances? N -  Housekeeping or managing your Housekeeping? N -  Some recent data might be hidden     Cognitive Testing  Alert? Yes  Normal Appearance?Yes  Oriented to person? Yes  Place? Yes   Time? Yes  Recall of three objects?  Yes  Can perform simple calculations? Yes  Displays appropriate judgment?Yes  Can read the correct time from a watch face?Yes  EOL planning: Does Patient Have a Medical Advance Directive?: Yes Type of Advance Directive: Living will, Healthcare Power of Attorney Does patient want to make changes to medical advance directive?: No - Patient declined Copy of [place] in Chart?: No - copy  requested  Review of Systems  Constitutional: Positive for malaise/fatigue. Negative for chills, fever and weight loss.  HENT: Negative for hearing loss and tinnitus.   Eyes: Negative for blurred vision and double vision.  Respiratory: Negative for cough, sputum production, shortness of breath and wheezing.   Cardiovascular: Negative for chest pain, palpitations, orthopnea, claudication, leg swelling and PND.  Gastrointestinal: Negative for abdominal pain, blood in stool, constipation, diarrhea, heartburn, melena, nausea and vomiting.  Genitourinary: Negative.   Musculoskeletal: Negative for falls, joint pain and myalgias.  Skin: Negative for rash.  Neurological: Negative for dizziness, tingling, sensory change, weakness and headaches.  Endo/Heme/Allergies: Negative for polydipsia.  Psychiatric/Behavioral: Positive for depression. Negative for memory loss, substance abuse and suicidal ideas. The patient has insomnia. The patient is not nervous/anxious.   All other systems reviewed and are negative.    Objective:     Today's Vitals   09/24/17  1345  BP: 128/76  Pulse: 66  Temp: 97.9 F (36.6 C)  SpO2: 99%  Weight: 114 lb (51.7 kg)  Height: 5' 1.5" (1.562 m)   Body mass index is 21.19 kg/m.  General appearance: alert, no distress, WD/WN, female HEENT: normocephalic, sclerae anicteric, TMs pearly, nares patent, no discharge or erythema, pharynx normal Oral cavity: MMM, no lesions Neck: supple, no lymphadenopathy, no thyromegaly, no masses Heart: RRR, normal S1, S2, no murmurs Lungs: CTA bilaterally, no wheezes, rhonchi, or rales Abdomen: +bs, soft, non tender, non distended, no masses, no hepatomegaly, no splenomegaly Musculoskeletal: nontender, no swelling, no obvious deformity Extremities: no edema, no cyanosis, no clubbing Pulses: 2+ symmetric, upper and lower extremities, normal cap refill Neurological: alert, oriented x 3, CN2-12 intact, strength normal upper extremities  and lower extremities, sensation normal throughout, DTRs 2+ throughout, no cerebellar signs, gait normal Psychiatric: normal affect, behavior normal, pleasant  Breasts: Breasts: breasts appear normal, no suspicious masses, no skin or nipple changes or axillary nodes.   Medicare Attestation I have personally reviewed: The patient's medical and social history Their use of alcohol, tobacco or illicit drugs Their current medications and supplements The patient's functional ability including ADLs,fall risks, home safety risks, cognitive, and hearing and visual impairment Diet and physical activities Evidence for depression or mood disorders  The patient's weight, height, BMI, and visual acuity have been recorded in the chart.  I have made referrals, counseling, and provided education to the patient based on review of the above and I have provided the patient with a written personalized care plan for preventive services.     Izora Ribas, NP   09/24/2017

## 2017-09-24 ENCOUNTER — Encounter: Payer: Self-pay | Admitting: Adult Health

## 2017-09-24 ENCOUNTER — Encounter: Payer: Self-pay | Admitting: Physician Assistant

## 2017-09-24 ENCOUNTER — Ambulatory Visit (INDEPENDENT_AMBULATORY_CARE_PROVIDER_SITE_OTHER): Payer: Medicare Other | Admitting: Adult Health

## 2017-09-24 VITALS — BP 128/76 | HR 66 | Temp 97.9°F | Ht 61.5 in | Wt 114.0 lb

## 2017-09-24 DIAGNOSIS — G4700 Insomnia, unspecified: Secondary | ICD-10-CM

## 2017-09-24 DIAGNOSIS — Z0001 Encounter for general adult medical examination with abnormal findings: Secondary | ICD-10-CM | POA: Diagnosis not present

## 2017-09-24 DIAGNOSIS — R6889 Other general symptoms and signs: Secondary | ICD-10-CM | POA: Diagnosis not present

## 2017-09-24 DIAGNOSIS — I10 Essential (primary) hypertension: Secondary | ICD-10-CM | POA: Diagnosis not present

## 2017-09-24 DIAGNOSIS — Z79899 Other long term (current) drug therapy: Secondary | ICD-10-CM

## 2017-09-24 DIAGNOSIS — Z1389 Encounter for screening for other disorder: Secondary | ICD-10-CM

## 2017-09-24 DIAGNOSIS — Z136 Encounter for screening for cardiovascular disorders: Secondary | ICD-10-CM | POA: Diagnosis not present

## 2017-09-24 DIAGNOSIS — I700 Atherosclerosis of aorta: Secondary | ICD-10-CM

## 2017-09-24 DIAGNOSIS — Z1211 Encounter for screening for malignant neoplasm of colon: Secondary | ICD-10-CM

## 2017-09-24 DIAGNOSIS — E559 Vitamin D deficiency, unspecified: Secondary | ICD-10-CM

## 2017-09-24 DIAGNOSIS — F325 Major depressive disorder, single episode, in full remission: Secondary | ICD-10-CM

## 2017-09-24 DIAGNOSIS — R5383 Other fatigue: Secondary | ICD-10-CM

## 2017-09-24 DIAGNOSIS — E7800 Pure hypercholesterolemia, unspecified: Secondary | ICD-10-CM

## 2017-09-24 DIAGNOSIS — Z0000 Encounter for general adult medical examination without abnormal findings: Secondary | ICD-10-CM

## 2017-09-24 DIAGNOSIS — R7309 Other abnormal glucose: Secondary | ICD-10-CM

## 2017-09-24 DIAGNOSIS — J439 Emphysema, unspecified: Secondary | ICD-10-CM

## 2017-09-24 DIAGNOSIS — K219 Gastro-esophageal reflux disease without esophagitis: Secondary | ICD-10-CM

## 2017-09-24 DIAGNOSIS — Z1239 Encounter for other screening for malignant neoplasm of breast: Secondary | ICD-10-CM

## 2017-09-24 DIAGNOSIS — M8580 Other specified disorders of bone density and structure, unspecified site: Secondary | ICD-10-CM

## 2017-09-24 MED ORDER — ESCITALOPRAM OXALATE 20 MG PO TABS: 20.0000 mg | ORAL_TABLET | Freq: Every day | ORAL | 2 refills | Status: DC

## 2017-09-24 NOTE — Patient Instructions (Addendum)
Stop celexa, start lexapro    Aim for 7+ servings of fruits and vegetables daily  80+ fluid ounces of water or unsweet tea for healthy kidneys  No more than 1 drink of alcohol per day, avoid smoking  Limit animal fats in diet for cholesterol and heart health - choose grass fed whenever available  Aim for low stress - take time to unwind and care for your mental health  Aim for 150 min of moderate intensity exercise weekly for heart health, and weights twice weekly for bone health  Aim for 7-9 hours of sleep daily      When it comes to diets, agreement about the perfect plan isn't easy to find, even among the experts. Experts at the [employer] developed an idea known as the Healthy Eating Plate. Just imagine a plate divided into logical, healthy portions.  The emphasis is on diet quality:  Load up on vegetables and fruits - one-half of your plate: Aim for color and variety, and remember that potatoes don't count.  Go for whole grains - one-quarter of your plate: Whole wheat, barley, wheat berries, quinoa, oats, brown rice, and foods made with them. If you want pasta, go with whole wheat pasta.  Protein power - one-quarter of your plate: Fish, chicken, beans, and nuts are all healthy, versatile protein sources. Limit red meat.  The diet, however, does go beyond the plate, offering a few other suggestions.  Use healthy plant oils, such as olive, canola, soy, corn, sunflower and peanut. Check the labels, and avoid partially hydrogenated oil, which have unhealthy trans fats.  If you're thirsty, drink water. Coffee and tea are good in moderation, but skip sugary drinks and limit milk and dairy products to one or two daily servings.  The type of carbohydrate in the diet is more important than the amount. Some sources of carbohydrates, such as vegetables, fruits, whole grains, and beans-are healthier than others.  Finally, stay active.

## 2017-09-25 LAB — CBC WITH DIFFERENTIAL/PLATELET
BASOS ABS: 39 {cells}/uL (ref 0–200)
BASOS PCT: 0.7 %
EOS ABS: 62 {cells}/uL (ref 15–500)
EOS PCT: 1.1 %
HCT: 37 % (ref 35.0–45.0)
HEMOGLOBIN: 13 g/dL (ref 11.7–15.5)
Lymphs Abs: 1949 cells/uL (ref 850–3900)
MCH: 32.5 pg (ref 27.0–33.0)
MCHC: 35.1 g/dL (ref 32.0–36.0)
MCV: 92.5 fL (ref 80.0–100.0)
MONOS PCT: 7.1 %
MPV: 10 fL (ref 7.5–12.5)
NEUTROS ABS: 3153 {cells}/uL (ref 1500–7800)
Neutrophils Relative %: 56.3 %
PLATELETS: 288 10*3/uL (ref 140–400)
RBC: 4 10*6/uL (ref 3.80–5.10)
RDW: 12.3 % (ref 11.0–15.0)
TOTAL LYMPHOCYTE: 34.8 %
WBC mixed population: 398 cells/uL (ref 200–950)
WBC: 5.6 10*3/uL (ref 3.8–10.8)

## 2017-09-25 LAB — URINALYSIS W MICROSCOPIC + REFLEX CULTURE
Bacteria, UA: NONE SEEN /HPF
Bilirubin Urine: NEGATIVE
GLUCOSE, UA: NEGATIVE
HYALINE CAST: NONE SEEN /LPF
Ketones, ur: NEGATIVE
Leukocyte Esterase: NEGATIVE
Nitrites, Initial: NEGATIVE
PROTEIN: NEGATIVE
SPECIFIC GRAVITY, URINE: 1.019 (ref 1.001–1.03)
Squamous Epithelial / LPF: NONE SEEN /HPF (ref <=5)
WBC, UA: NONE SEEN /HPF (ref 0–5)

## 2017-09-25 LAB — LIPID PANEL
CHOL/HDL RATIO: 5.3 (calc) — AB (ref <5.0)
CHOLESTEROL: 247 mg/dL — AB (ref <200)
HDL: 47 mg/dL — AB (ref >50)
LDL CHOLESTEROL (CALC): 170 mg/dL — AB
Non-HDL Cholesterol (Calc): 200 mg/dL (calc) — ABNORMAL HIGH (ref <130)
TRIGLYCERIDES: 151 mg/dL — AB (ref <150)

## 2017-09-25 LAB — COMPLETE METABOLIC PANEL WITH GFR
AG Ratio: 1.8 (calc) (ref 1.0–2.5)
ALKALINE PHOSPHATASE (APISO): 90 U/L (ref 33–130)
ALT: 9 U/L (ref 6–29)
AST: 14 U/L (ref 10–35)
Albumin: 4.6 g/dL (ref 3.6–5.1)
BILIRUBIN TOTAL: 0.5 mg/dL (ref 0.2–1.2)
BUN: 12 mg/dL (ref 7–25)
CHLORIDE: 102 mmol/L (ref 98–110)
CO2: 29 mmol/L (ref 20–32)
CREATININE: 0.62 mg/dL (ref 0.60–0.93)
Calcium: 9.7 mg/dL (ref 8.6–10.4)
GFR, Est African American: 106 mL/min/{1.73_m2} (ref >=60)
GFR, Est Non African American: 91 mL/min/{1.73_m2} (ref >=60)
Globulin: 2.5 g/dL (calc) (ref 1.9–3.7)
Glucose, Bld: 93 mg/dL (ref 65–99)
Potassium: 4.1 mmol/L (ref 3.5–5.3)
Sodium: 140 mmol/L (ref 135–146)
Total Protein: 7.1 g/dL (ref 6.1–8.1)

## 2017-09-25 LAB — MICROALBUMIN / CREATININE URINE RATIO
Creatinine, Urine: 91 mg/dL (ref 20–275)
MICROALB UR: 0.8 mg/dL
MICROALB/CREAT RATIO: 9 ug/mg{creat} (ref <30)

## 2017-09-25 LAB — TSH: TSH: 3.31 mIU/L (ref 0.40–4.50)

## 2017-09-25 LAB — VITAMIN D 25 HYDROXY (VIT D DEFICIENCY, FRACTURES): Vit D, 25-Hydroxy: 37 ng/mL (ref 30–100)

## 2017-09-25 LAB — HEMOGLOBIN A1C
HEMOGLOBIN A1C: 5.2 %{Hb} (ref <5.7)
Mean Plasma Glucose: 103 (calc)
eAG (mmol/L): 5.7 (calc)

## 2017-09-25 LAB — VITAMIN B12: Vitamin B-12: 392 pg/mL (ref 200–1100)

## 2017-09-25 LAB — NO CULTURE INDICATED

## 2017-09-25 LAB — MAGNESIUM: Magnesium: 2.2 mg/dL (ref 1.5–2.5)

## 2017-10-02 ENCOUNTER — Other Ambulatory Visit: Payer: Self-pay | Admitting: Internal Medicine

## 2017-10-14 ENCOUNTER — Other Ambulatory Visit: Payer: Self-pay

## 2017-10-14 DIAGNOSIS — Z1211 Encounter for screening for malignant neoplasm of colon: Secondary | ICD-10-CM

## 2017-10-14 LAB — POC HEMOCCULT BLD/STL (HOME/3-CARD/SCREEN)
FECAL OCCULT BLD: NEGATIVE
FECAL OCCULT BLD: NEGATIVE
FECAL OCCULT BLD: NEGATIVE

## 2017-11-17 ENCOUNTER — Other Ambulatory Visit: Payer: Self-pay | Admitting: Internal Medicine

## 2017-11-18 ENCOUNTER — Ambulatory Visit (HOSPITAL_COMMUNITY)
Admission: RE | Admit: 2017-11-18 | Discharge: 2017-11-18 | Disposition: A | Discharge status: Home / Self Care | Payer: Medicare Other | Source: Ambulatory Visit | Attending: Adult Health | Admitting: Adult Health

## 2017-11-18 ENCOUNTER — Ambulatory Visit
Admission: RE | Admit: 2017-11-18 | Discharge: 2017-11-18 | Disposition: A | Discharge status: Home / Self Care | Payer: Medicare Other | Source: Ambulatory Visit | Attending: Adult Health | Admitting: Adult Health

## 2017-11-18 DIAGNOSIS — Z87891 Personal history of nicotine dependence: Secondary | ICD-10-CM | POA: Insufficient documentation

## 2017-11-18 DIAGNOSIS — Z1239 Encounter for other screening for malignant neoplasm of breast: Secondary | ICD-10-CM

## 2017-11-18 DIAGNOSIS — J439 Emphysema, unspecified: Secondary | ICD-10-CM

## 2017-11-18 DIAGNOSIS — M8580 Other specified disorders of bone density and structure, unspecified site: Secondary | ICD-10-CM

## 2017-12-25 ENCOUNTER — Other Ambulatory Visit: Payer: Self-pay | Admitting: Adult Health

## 2017-12-25 DIAGNOSIS — F325 Major depressive disorder, single episode, in full remission: Secondary | ICD-10-CM

## 2018-01-02 ENCOUNTER — Other Ambulatory Visit: Payer: Self-pay | Admitting: Internal Medicine

## 2018-02-18 ENCOUNTER — Other Ambulatory Visit: Payer: Self-pay | Admitting: Internal Medicine

## 2018-03-20 ENCOUNTER — Other Ambulatory Visit: Payer: Self-pay

## 2018-03-20 NOTE — Telephone Encounter (Signed)
Pharmacy states that 0.5mg  tablet of Alprazolam is not available, pharmacy only has 1mg . Please advise

## 2018-03-23 MED ORDER — ALPRAZOLAM 1 MG PO TABS: ORAL_TABLET | ORAL | 0 refills | Status: DC

## 2018-03-23 NOTE — Addendum Note (Signed)
Addended by: Chancy Hurter on: 03/23/2018 09:42 AM   Modules accepted: Orders

## 2018-03-26 ENCOUNTER — Ambulatory Visit: Payer: Self-pay | Admitting: Internal Medicine

## 2018-04-05 ENCOUNTER — Encounter: Payer: Self-pay | Admitting: Internal Medicine

## 2018-04-05 NOTE — Progress Notes (Signed)
This very nice 72 y.o. DWF presents for 3 month follow up with hx/o labile HTN, HLD, Pre-Diabetes and Vitamin D Deficiency.  Patient has hx/o GERD controlled by diet.      Patient has been monitored expectantly for labile HTN since 2000 & BP has been controlled at home. Today's BP is at goal - 100/62. Patient has had no complaints of any cardiac type chest pain, palpitations, dyspnea / orthopnea / PND, dizziness, claudication, or dependent edema.     Hyperlipidemia is not controlled with diet as she has self discontinued her Chol meds.  Last Lipids were not at goal: Lab Results  Component Value Date   CHOL 247 (H) 09/24/2017   HDL 47 (L) 09/24/2017   LDLCALC 170 (H) 09/24/2017   TRIG 151 (H) 09/24/2017   CHOLHDL 5.3 (H) 09/24/2017      Also, the patient has history of PreDiabetes  (A1c 5.7% / 2013)  and she has had no symptoms of reactive hypoglycemia, diabetic polys, paresthesias or visual blurring.  Last A1c was Normal & at goal: Lab Results  Component Value Date   HGBA1C 5.2 09/24/2017      Further, the patient also has history of Vitamin D Deficiency ("42" on tx in 2011) and she does not supplement vitamin D regularly. Last vitamin D was still not at goal:  Lab Results  Component Value Date   VD25OH 37 09/24/2017   Current Outpatient Medications on File Prior to Visit  Medication Sig  . ALPRAZolam (XANAX) 1 MG tablet Take 1/2 tablet by mouth up to three times a day as needed. Prescription should last linger than one month, should not be taken daily.  . cholecalciferol (VITAMIN D) 1000 units tablet Take 1,000 Units by mouth daily.  Marland Kitchen escitalopram (LEXAPRO) 20 MG tablet TAKE 1 TABLET BY MOUTH EVERY DAY  . Magnesium 250 MG TABS Take 250 mg by mouth daily.  . Multiple Vitamin (MULTIVITAMIN) tablet Take 1 tablet by mouth daily.   No current facility-administered medications on file prior to visit.    Allergies  Allergen Reactions  . Codeine   . Decongestant [Pseudoephedrine  Hcl]    PMHx:   Past Medical History:  Diagnosis Date  . Anxiety   . Breast cancer ([place])    left   . Depressive disorder, not elsewhere classified   . Diverticulosis of colon (without mention of hemorrhage) 2007   Colonoscopy  . Esophagitis, unspecified 2001   EGD  . GERD (gastroesophageal reflux disease)   . Hx of colonic polyps 2003   Colonoscopy   . IBS (irritable bowel syndrome)   . Insomnia, unspecified   . Internal hemorrhoids without mention of complication 4235   Colonoscopy   . Personal history of radiation therapy 04/08/2004  . Pure hypercholesterolemia   . Stricture and stenosis of esophagus 2001, 2007   EGD  . Unspecified essential hypertension     There is no immunization history on file for this patient. Past Surgical History:  Procedure Laterality Date  . ABDOMINAL HYSTERECTOMY    . BREAST LUMPECTOMY  2005   left  . CHOLECYSTECTOMY  2004  . NASAL SINUS SURGERY     FHx:    Reviewed / unchanged  SHx:    Reviewed / unchanged   Systems Review:  Constitutional: Denies fever, chills, wt changes, headaches, insomnia, fatigue, night sweats, change in appetite. Eyes: Denies redness, blurred vision, diplopia, discharge, itchy, watery eyes.  ENT: Denies discharge, congestion, post nasal  drip, epistaxis, sore throat, earache, hearing loss, dental pain, tinnitus, vertigo, sinus pain, snoring.  CV: Denies chest pain, palpitations, irregular heartbeat, syncope, dyspnea, diaphoresis, orthopnea, PND, claudication or edema. Respiratory: denies cough, dyspnea, DOE, pleurisy, hoarseness, laryngitis, wheezing.  Gastrointestinal: Denies dysphagia, odynophagia, heartburn, reflux, water brash, abdominal pain or cramps, nausea, vomiting, bloating, diarrhea, constipation, hematemesis, melena, hematochezia  or hemorrhoids. Genitourinary: Denies dysuria, frequency, urgency, nocturia, hesitancy, discharge, hematuria or flank pain. Musculoskeletal: Denies arthralgias, myalgias,  stiffness, jt. swelling, pain, limping or strain/sprain.  Skin: Denies pruritus, rash, hives, warts, acne, eczema or change in skin lesion(s). Neuro: No weakness, tremor, incoordination, spasms, paresthesia or pain. Psychiatric: Denies confusion, memory loss or sensory loss. Endo: Denies change in weight, skin or hair change.  Heme/Lymph: No excessive bleeding, bruising or enlarged lymph nodes.  Physical Exam  BP 100/62   Pulse 80   Temp (!) 97.3 F (36.3 C)   Resp 16   Ht 5\' 1"  (1.549 m)   Wt 119 lb (54 kg)   BMI 22.48 kg/m   Appears  well nourished, well groomed  and in no distress.  Eyes: PERRLA, EOMs, conjunctiva no swelling or erythema. Sinuses: No frontal/maxillary tenderness ENT/Mouth: EAC's clear, TM's nl w/o erythema, bulging. Nares clear w/o erythema, swelling, exudates. Oropharynx clear without erythema or exudates. Oral hygiene is good. Tongue normal, non obstructing. Hearing intact.  Neck: Supple. Thyroid not palpable. Car 2+/2+ without bruits, nodes or JVD. Chest: Respirations nl with BS clear & equal w/o rales, rhonchi, wheezing or stridor.  Cor: Heart sounds normal w/ regular rate and rhythm without sig. murmurs, gallops, clicks or rubs. Peripheral pulses normal and equal  without edema.  Abdomen: Soft & bowel sounds normal. Non-tender w/o guarding, rebound, hernias, masses or organomegaly.  Lymphatics: Unremarkable.  Musculoskeletal: Full ROM all peripheral extremities, joint stability, 5/5 strength and normal gait.  Skin: Warm, dry without exposed rashes, lesions or ecchymosis apparent.  Neuro: Cranial nerves intact, reflexes equal bilaterally. Sensory-motor testing grossly intact. Tendon reflexes grossly intact.  Pysch: Alert & oriented x 3.  Insight and judgement nl & appropriate. No ideations.  Assessment and Plan:  1. Essential hypertension  - Continue medication, monitor blood pressure at home.  - Continue DASH diet.  Reminder to go to the ER if any CP,    SOB, nausea, dizziness, severe HA, changes vision/speech.  - CBC with Differential/Platelet - COMPLETE METABOLIC PANEL WITH GFR - Magnesium - TSH  2. Hyperlipidemia, mixed  - Continue diet/meds, exercise,& lifestyle modifications.  - Continue monitor periodic cholesterol/liver & renal functions   - Lipid panel - TSH  3. Abnormal glucose  - Continue diet, exercise,  - lifestyle modifications.  - Monitor appropriate labs.  - Hemoglobin A1c - Insulin, random  4. Vitamin D deficiency  - Continue supplementation.  - VITAMIN D 25 Hydroxyl  5. Gastroesophageal reflux disease  - CBC with Differential/Platelet  6. Medication management  - CBC with Differential/Platelet - COMPLETE METABOLIC PANEL WITH GFR - Magnesium - Lipid panel - TSH - Hemoglobin A1c - Insulin, random - VITAMIN D 25 Hydroxyl       Discussed  regular exercise, BP monitoring, weight control to achieve/maintain BMI less than 25 and discussed med and SE's. Recommended labs to assess and monitor clinical status with further disposition pending results of labs. Over 30 minutes of exam, counseling, chart review was performed.

## 2018-04-05 NOTE — Patient Instructions (Signed)

## 2018-04-06 ENCOUNTER — Other Ambulatory Visit: Payer: Self-pay | Admitting: *Deleted

## 2018-04-06 ENCOUNTER — Ambulatory Visit (INDEPENDENT_AMBULATORY_CARE_PROVIDER_SITE_OTHER): Payer: Medicare Other | Admitting: Internal Medicine

## 2018-04-06 VITALS — BP 100/62 | HR 80 | Temp 97.3°F | Resp 16 | Ht 61.0 in | Wt 119.0 lb

## 2018-04-06 DIAGNOSIS — Z79899 Other long term (current) drug therapy: Secondary | ICD-10-CM

## 2018-04-06 DIAGNOSIS — R7309 Other abnormal glucose: Secondary | ICD-10-CM

## 2018-04-06 DIAGNOSIS — E782 Mixed hyperlipidemia: Secondary | ICD-10-CM

## 2018-04-06 DIAGNOSIS — N3010 Interstitial cystitis (chronic) without hematuria: Secondary | ICD-10-CM

## 2018-04-06 DIAGNOSIS — E559 Vitamin D deficiency, unspecified: Secondary | ICD-10-CM

## 2018-04-06 DIAGNOSIS — K219 Gastro-esophageal reflux disease without esophagitis: Secondary | ICD-10-CM

## 2018-04-06 DIAGNOSIS — I10 Essential (primary) hypertension: Secondary | ICD-10-CM

## 2018-04-06 MED ORDER — AMITRIPTYLINE HCL 10 MG PO TABS: ORAL_TABLET | ORAL | 1 refills | Status: DC

## 2018-04-07 ENCOUNTER — Other Ambulatory Visit: Payer: Self-pay | Admitting: Internal Medicine

## 2018-04-07 LAB — COMPLETE METABOLIC PANEL WITH GFR
AG RATIO: 1.9 (calc) (ref 1.0–2.5)
ALBUMIN MSPROF: 4.4 g/dL (ref 3.6–5.1)
ALT: 14 U/L (ref 6–29)
AST: 16 U/L (ref 10–35)
Alkaline phosphatase (APISO): 106 U/L (ref 33–130)
BUN / CREAT RATIO: 8 (calc) (ref 6–22)
BUN: 6 mg/dL — AB (ref 7–25)
CALCIUM: 9.1 mg/dL (ref 8.6–10.4)
CO2: 28 mmol/L (ref 20–32)
Chloride: 102 mmol/L (ref 98–110)
Creat: 0.75 mg/dL (ref 0.60–0.93)
GFR, EST AFRICAN AMERICAN: 93 mL/min/{1.73_m2} (ref >=60)
GFR, EST NON AFRICAN AMERICAN: 80 mL/min/{1.73_m2} (ref >=60)
GLUCOSE: 99 mg/dL (ref 65–99)
Globulin: 2.3 g/dL (calc) (ref 1.9–3.7)
Potassium: 3.5 mmol/L (ref 3.5–5.3)
Sodium: 140 mmol/L (ref 135–146)
TOTAL PROTEIN: 6.7 g/dL (ref 6.1–8.1)
Total Bilirubin: 0.5 mg/dL (ref 0.2–1.2)

## 2018-04-07 LAB — CBC WITH DIFFERENTIAL/PLATELET
ABSOLUTE MONOCYTES: 408 {cells}/uL (ref 200–950)
BASOS PCT: 0.7 %
Basophils Absolute: 42 cells/uL (ref 0–200)
EOS ABS: 150 {cells}/uL (ref 15–500)
Eosinophils Relative: 2.5 %
HCT: 39.4 % (ref 35.0–45.0)
HEMOGLOBIN: 13.5 g/dL (ref 11.7–15.5)
Lymphs Abs: 1428 cells/uL (ref 850–3900)
MCH: 32.4 pg (ref 27.0–33.0)
MCHC: 34.3 g/dL (ref 32.0–36.0)
MCV: 94.5 fL (ref 80.0–100.0)
MPV: 9.5 fL (ref 7.5–12.5)
Monocytes Relative: 6.8 %
NEUTROS ABS: 3972 {cells}/uL (ref 1500–7800)
Neutrophils Relative %: 66.2 %
Platelets: 324 10*3/uL (ref 140–400)
RBC: 4.17 10*6/uL (ref 3.80–5.10)
RDW: 13.1 % (ref 11.0–15.0)
TOTAL LYMPHOCYTE: 23.8 %
WBC: 6 10*3/uL (ref 3.8–10.8)

## 2018-04-07 LAB — LIPID PANEL
CHOL/HDL RATIO: 5.3 (calc) — AB (ref <5.0)
Cholesterol: 242 mg/dL — ABNORMAL HIGH (ref <200)
HDL: 46 mg/dL — AB (ref >50)
LDL Cholesterol (Calc): 154 mg/dL (calc) — ABNORMAL HIGH
NON-HDL CHOLESTEROL (CALC): 196 mg/dL — AB (ref <130)
TRIGLYCERIDES: 256 mg/dL — AB (ref <150)

## 2018-04-07 LAB — VITAMIN D 25 HYDROXY (VIT D DEFICIENCY, FRACTURES): Vit D, 25-Hydroxy: 37 ng/mL (ref 30–100)

## 2018-04-07 LAB — INSULIN, RANDOM: Insulin: 7.6 u[IU]/mL (ref 2.0–19.6)

## 2018-04-07 LAB — MAGNESIUM: Magnesium: 2.1 mg/dL (ref 1.5–2.5)

## 2018-04-07 LAB — HEMOGLOBIN A1C
Hgb A1c MFr Bld: 5.4 % of total Hgb (ref <5.7)
Mean Plasma Glucose: 108 (calc)
eAG (mmol/L): 6 (calc)

## 2018-04-07 LAB — TSH: TSH: 3.29 mIU/L (ref 0.40–4.50)

## 2018-04-07 MED ORDER — ROSUVASTATIN CALCIUM 20 MG PO TABS: ORAL_TABLET | ORAL | 1 refills | Status: DC

## 2018-05-08 ENCOUNTER — Other Ambulatory Visit: Payer: Self-pay | Admitting: Adult Health

## 2018-06-04 ENCOUNTER — Other Ambulatory Visit: Payer: Self-pay | Admitting: Internal Medicine

## 2018-06-04 DIAGNOSIS — N3010 Interstitial cystitis (chronic) without hematuria: Secondary | ICD-10-CM

## 2018-06-22 ENCOUNTER — Other Ambulatory Visit: Payer: Self-pay | Admitting: Adult Health

## 2018-07-19 ENCOUNTER — Other Ambulatory Visit: Payer: Self-pay | Admitting: Adult Health

## 2018-07-19 DIAGNOSIS — F325 Major depressive disorder, single episode, in full remission: Secondary | ICD-10-CM

## 2018-08-03 ENCOUNTER — Other Ambulatory Visit: Payer: Self-pay | Admitting: Adult Health

## 2018-08-03 ENCOUNTER — Telehealth: Payer: Self-pay

## 2018-08-03 ENCOUNTER — Other Ambulatory Visit: Payer: Self-pay | Admitting: Internal Medicine

## 2018-08-03 DIAGNOSIS — G4700 Insomnia, unspecified: Secondary | ICD-10-CM

## 2018-08-03 MED ORDER — ALPRAZOLAM 1 MG PO TABS: ORAL_TABLET | ORAL | 0 refills | Status: DC

## 2018-08-03 NOTE — Telephone Encounter (Signed)
Pharmacy requesting specific directions and diagnosis code for Alprazolam. Please resend.

## 2018-09-12 ENCOUNTER — Other Ambulatory Visit: Payer: Self-pay | Admitting: Adult Health

## 2018-09-12 ENCOUNTER — Other Ambulatory Visit: Payer: Self-pay | Admitting: Internal Medicine

## 2018-09-12 DIAGNOSIS — N3010 Interstitial cystitis (chronic) without hematuria: Secondary | ICD-10-CM

## 2018-09-12 DIAGNOSIS — G4700 Insomnia, unspecified: Secondary | ICD-10-CM

## 2018-10-05 NOTE — Progress Notes (Signed)
MEDICARE ANNUAL WELLNESS VISIT AND FOLLOW UP  Assessment:    Diagnoses and all orders for this visit:  Encounter for Medicare annual wellness exam  Aortic atherosclerosis ([place]) Control blood pressure, cholesterol, glucose, increase exercise.   Pulmonary emphysema, unspecified emphysema type ([place]) By imaging, monitor, asymptomatic   Former smoker Get CXR annually; UTD  Essential hypertension Continue medications Monitor blood pressure at home; call if consistently over 130/80 Continue DASH diet.   Reminder to go to the ER if any CP, SOB, nausea, dizziness, severe HA, changes vision/speech, left arm numbness and tingling and jaw pain.  Gastroesophageal reflux disease without esophagitis Well managed on current medications Discussed diet, avoiding triggers and other lifestyle changes  Osteopenia, unspecified location Repeat DEXA 2021, Emphasized high impact/weight bearing exercise, calcium, vitamin D  Vitamin D deficiency Has increased dose Continue supplementation; discussed goal of 60-100 Check vitamin D level   Major depression in remission ([place]) Improved with lexapro; uses xanax for night time panic attacks and insomnia Lifestyle discussed: diet/exerise, sleep hygiene, stress management, hydration  Insomnia, unspecified type  Reminded to limit use of benzo, <5 days/week good sleep hygiene discussed, increase day time activity  HYPERCHOLESTEROLEMIA Newly on rosuvastatin 20 mg daily and tolerating well  LDL goal <100 Encouraged low cholesterol diet and exercise.  Check lipid panel.   Abnormal glucose Recent A1Cs at goal Discussed diet/exercise, weight management  Defer A1C; check CMP  Orders Placed This Encounter  Procedures  . CBC with Differential/Platelet  . COMPLETE METABOLIC PANEL WITH GFR  . Lipid panel  . TSH  . Magnesium  . VITAMIN D 25 Hydroxy (Vit-D Deficiency, Fractures)    Over 40 minutes of exam, counseling, chart review and critical  decision making was performed Future Appointments  Date Time Provider [place]  04/07/2019 11:00 AM Unk Pinto, MD [facility] None     Plan:   During the course of the visit the patient was educated and counseled about appropriate screening and preventive services including:    Pneumococcal vaccine   Prevnar 13  Influenza vaccine  Td vaccine  Screening electrocardiogram  Bone densitometry screening  Colorectal cancer screening  Diabetes screening  Glaucoma screening  Nutrition counseling   Advanced directives: requested   Subjective:  Samantha [name] is a 73 y.o. female who presents for Medicare Annual Wellness Visit and 6 month follow up.   She has remote history of breast cancer, 14 years ago, was released by Dr. Jana Hakim and getting annual mammograms.   She is on lexapro for depression; feels that she is doing well on this agent. Previously on celexa. She uses the xanax at night for insomnia, not during the day. Taking at night when she has most of her panic attacks, takes 1/2-1 depending on how bad she is doing, trying to limit.  She has ICS and takes elavil PRN, worse when stressed, sleeping away from home.   BMI is Body mass index is 22.14 kg/m., she has not been working on diet and exercise. Wt Readings from Last 3 Encounters:  10/06/18 117 lb 3.2 oz (53.2 kg)  04/06/18 119 lb (54 kg)  09/24/17 114 lb (51.7 kg)   Today their BP is BP: 120/80 She does workout. She denies chest pain, shortness of breath, dizziness.   She is not on cholesterol medication (was prescribed rosuvastatin 20 mg daily) and denies myalgias. Her cholesterol is not at goal. The cholesterol last visit was:   Lab Results  Component Value Date   CHOL 242 (  H) 04/06/2018   HDL 46 (L) 04/06/2018   LDLCALC 154 (H) 04/06/2018   TRIG 256 (H) 04/06/2018   CHOLHDL 5.3 (H) 04/06/2018    She has been working on diet and exercise for glucose management, and denies foot  ulcerations, increased appetite, nausea, paresthesia of the feet, polydipsia, polyuria, visual disturbances, vomiting and weight loss. Last A1C in the office was:  Lab Results  Component Value Date   HGBA1C 5.4 04/06/2018   Last GFR: Lab Results  Component Value Date   GFRNONAA 80 04/06/2018   Patient is on Vitamin D supplement, taking 2400 IU Lab Results  Component Value Date   VD25OH 37 04/06/2018      Medication Review: Current Outpatient Medications on File Prior to Visit  Medication Sig Dispense Refill  . ALPRAZolam (XANAX) 1 MG tablet Take 1/2 to 1 tablet 1 to 2x /day  for Anxiety or Sleep & please try to limit to 5 days /week to avoid addiction - Should last longer than 30 days 30 tablet 1  . amitriptyline (ELAVIL) 10 MG tablet Take 1 to 2 tablets at Bedtime as directed for sleep 180 tablet 3  . cholecalciferol (VITAMIN D) 1000 units tablet Take 2,400 Units by mouth daily.     Marland Kitchen escitalopram (LEXAPRO) 20 MG tablet TAKE 1 TABLET BY MOUTH EVERY DAY 30 tablet 2  . Magnesium 250 MG TABS Take 250 mg by mouth daily.    . Multiple Vitamin (MULTIVITAMIN) tablet Take 1 tablet by mouth daily.    . rosuvastatin (CRESTOR) 20 MG tablet Take 1 tablet daily for Cholesterol 90 tablet 1  . vitamin C (ASCORBIC ACID) 500 MG tablet Take 500 mg by mouth daily.     No current facility-administered medications on file prior to visit.     Allergies  Allergen Reactions  . Codeine   . Decongestant [Pseudoephedrine Hcl]     Current Problems (verified) Patient Active Problem List   Diagnosis Date Noted  . Aortic atherosclerosis ([place]) 09/23/2017  . Emphysema of lung ([place]) 09/23/2017  . Encounter for Medicare annual wellness exam 10/24/2014  . Osteopenia 09/08/2014  . Abnormal glucose 05/04/2014  . Vitamin D deficiency 05/04/2014  . Medication management 05/04/2014  . Esophageal reflux 01/31/2011  . HYPERCHOLESTEROLEMIA 08/15/2009  . Major depression in remission ([place]) 08/15/2009  .  Essential hypertension 08/15/2009  . Insomnia 08/15/2009    Screening Tests  There is no immunization history on file for this patient.   Health Maintenance:   Last colonoscopy: 10/2012 Last mammogram 3D: 11/2017, will schedule  Last pap smear/pelvic exam: 2011 remote, declines another DEXA: 11/2017 - T fem -1.7 Ct chest 2009 CXR 11/2017 US soft tissue head 09/2014  Prior vaccinations: TD or Tdap: 1999 and declines Influenza: declines Pneumococcal: 2000 declines Prevnar 13 declines Shingles/Zostavax: declines due to cost  Names of Other Physician/Practitioners you currently use: 1. [facility]- here for primary care 2. [facility], Dr. Herschel Sen[place], eye doctor, last visit 2019, has left cataract, monitoring only 3. Dr. Fara Boros, family dentistry, last visit 2019, goes q36m  Patient Care Team: Unk Pinto, MD as PCP - General (Internal Medicine) Inda Castle, MD (Inactive) as Consulting Physician (Gastroenterology) Crista Luria, MD as Consulting Physician (Dermatology) Magrinat, Virgie Dad, MD as Consulting Physician (Oncology)  SURGICAL HISTORY She  has a past surgical history that includes Abdominal hysterectomy; Cholecystectomy (2004); Breast lumpectomy (2005); and Nasal sinus surgery. FAMILY HISTORY Her family history includes Bipolar disorder in her mother; Breast  cancer in her maternal aunt, maternal aunt, maternal aunt, maternal aunt, and mother; Heart disease in her mother; Heart disease (age of onset: 36) in her father; Parkinson's disease in her sister. SOCIAL HISTORY She  reports that she quit smoking about 6 years ago. She has a 14.00 pack-year smoking history. She has never used smokeless tobacco. She reports current alcohol use of about 1.0 standard drinks of alcohol per week. She reports that she does not use drugs.   MEDICARE WELLNESS OBJECTIVES: Physical activity: Current Exercise Habits: Home exercise routine,  Time (Minutes): 30, Frequency (Times/Week): 3, Weekly Exercise (Minutes/Week): 90, Intensity: Mild, Exercise limited by: None identified Cardiac risk factors: Cardiac Risk Factors include: advanced age (>15men, >67 women);dyslipidemia;smoking/ tobacco exposure Depression/mood screen:   Depression screen [facility] 2/9 10/06/2018  Decreased Interest 0  Down, Depressed, Hopeless 1  PHQ - 2 Score 1  Altered sleeping -  Tired, decreased energy -  Change in appetite -  Feeling bad or failure about yourself  -  Trouble concentrating -  Moving slowly or fidgety/restless -  Suicidal thoughts -  PHQ-9 Score -  Difficult doing work/chores -    ADLs:  In your present state of health, do you have any difficulty performing the following activities: 10/06/2018 04/05/2018  Hearing? N N  Vision? N N  Difficulty concentrating or making decisions? N N  Walking or climbing stairs? N N  Dressing or bathing? N N  Doing errands, shopping? N N  Some recent data might be hidden     Cognitive Testing  Alert? Yes  Normal Appearance?Yes  Oriented to person? Yes  Place? Yes   Time? Yes  Recall of three objects?  Yes  Can perform simple calculations? Yes  Displays appropriate judgment?Yes  Can read the correct time from a watch face?Yes  EOL planning: Does Patient Have a Medical Advance Directive?: Yes Type of Advance Directive: Healthcare Power of Attorney, Living will Does patient want to make changes to medical advance directive?: No - Patient declined Copy of [place] in Chart?: No - copy requested  Review of Systems  Constitutional: Negative for chills, fever, malaise/fatigue and weight loss.  HENT: Negative for hearing loss and tinnitus.   Eyes: Negative for blurred vision and double vision.  Respiratory: Negative for cough, sputum production, shortness of breath and wheezing.   Cardiovascular: Negative for chest pain, palpitations, orthopnea, claudication, leg swelling and PND.   Gastrointestinal: Negative for abdominal pain, blood in stool, constipation, diarrhea, heartburn, melena, nausea and vomiting.  Genitourinary: Negative.   Musculoskeletal: Negative for falls, joint pain and myalgias.  Skin: Negative for rash.  Neurological: Negative for dizziness, tingling, sensory change, weakness and headaches.  Endo/Heme/Allergies: Negative for polydipsia.  Psychiatric/Behavioral: Negative for depression, memory loss, substance abuse and suicidal ideas. The patient is nervous/anxious and has insomnia.   All other systems reviewed and are negative.    Objective:     Today's Vitals   10/06/18 1101  BP: 120/80  Pulse: 88  Temp: (!) 97.5 F (36.4 C)  SpO2: 97%  Weight: 117 lb 3.2 oz (53.2 kg)  Height: 5\' 1"  (1.549 m)   Body mass index is 22.14 kg/m.  General appearance: alert, no distress, WD/WN, female HEENT: normocephalic, sclerae anicteric, TMs pearly, nares patent, no discharge or erythema, pharynx normal Oral cavity: MMM, no lesions Neck: supple, no lymphadenopathy, no thyromegaly, no masses Heart: RRR, normal S1, S2, no murmurs Lungs: CTA bilaterally, no wheezes, rhonchi, or rales Abdomen: +bs, soft, non  tender, non distended, no masses, no hepatomegaly, no splenomegaly Musculoskeletal: nontender, no swelling, no obvious deformity Extremities: no edema, no cyanosis, no clubbing Pulses: 2+ symmetric, upper and lower extremities, normal cap refill Neurological: alert, oriented x 3, CN2-12 intact, strength normal upper extremities and lower extremities, sensation normal throughout, DTRs 2+ throughout, no cerebellar signs, gait normal Psychiatric: normal affect, behavior normal, pleasant  Breasts: Breasts: breasts appear normal, no suspicious masses, no skin or nipple changes or axillary nodes.   Medicare Attestation I have personally reviewed: The patient's medical and social history Their use of alcohol, tobacco or illicit drugs Their current  medications and supplements The patient's functional ability including ADLs,fall risks, home safety risks, cognitive, and hearing and visual impairment Diet and physical activities Evidence for depression or mood disorders  The patient's weight, height, BMI, and visual acuity have been recorded in the chart.  I have made referrals, counseling, and provided education to the patient based on review of the above and I have provided the patient with a written personalized care plan for preventive services.     Izora Ribas, NP   10/06/2018

## 2018-10-06 ENCOUNTER — Other Ambulatory Visit: Payer: Self-pay

## 2018-10-06 ENCOUNTER — Encounter: Payer: Self-pay | Admitting: Adult Health

## 2018-10-06 ENCOUNTER — Ambulatory Visit (INDEPENDENT_AMBULATORY_CARE_PROVIDER_SITE_OTHER): Payer: Medicare Other | Admitting: Adult Health

## 2018-10-06 VITALS — BP 120/80 | HR 88 | Temp 97.5°F | Ht 61.0 in | Wt 117.2 lb

## 2018-10-06 DIAGNOSIS — K219 Gastro-esophageal reflux disease without esophagitis: Secondary | ICD-10-CM

## 2018-10-06 DIAGNOSIS — R7309 Other abnormal glucose: Secondary | ICD-10-CM

## 2018-10-06 DIAGNOSIS — J439 Emphysema, unspecified: Secondary | ICD-10-CM | POA: Diagnosis not present

## 2018-10-06 DIAGNOSIS — I10 Essential (primary) hypertension: Secondary | ICD-10-CM | POA: Diagnosis not present

## 2018-10-06 DIAGNOSIS — R6889 Other general symptoms and signs: Secondary | ICD-10-CM | POA: Diagnosis not present

## 2018-10-06 DIAGNOSIS — Z6822 Body mass index (BMI) 22.0-22.9, adult: Secondary | ICD-10-CM

## 2018-10-06 DIAGNOSIS — Z0000 Encounter for general adult medical examination without abnormal findings: Secondary | ICD-10-CM

## 2018-10-06 DIAGNOSIS — I700 Atherosclerosis of aorta: Secondary | ICD-10-CM | POA: Diagnosis not present

## 2018-10-06 DIAGNOSIS — Z79899 Other long term (current) drug therapy: Secondary | ICD-10-CM

## 2018-10-06 DIAGNOSIS — Z0001 Encounter for general adult medical examination with abnormal findings: Secondary | ICD-10-CM

## 2018-10-06 DIAGNOSIS — F325 Major depressive disorder, single episode, in full remission: Secondary | ICD-10-CM

## 2018-10-06 DIAGNOSIS — E559 Vitamin D deficiency, unspecified: Secondary | ICD-10-CM

## 2018-10-06 DIAGNOSIS — M8580 Other specified disorders of bone density and structure, unspecified site: Secondary | ICD-10-CM

## 2018-10-06 DIAGNOSIS — E7800 Pure hypercholesterolemia, unspecified: Secondary | ICD-10-CM

## 2018-10-06 DIAGNOSIS — G4700 Insomnia, unspecified: Secondary | ICD-10-CM

## 2018-10-06 NOTE — Patient Instructions (Addendum)
Samantha [name] , Thank you for taking time to come for your Medicare Wellness Visit. I appreciate your ongoing commitment to your health goals. Please review the following plan we discussed and let me know if I can assist you in the future.   These are the goals we discussed: Goals    . Exercise 150 min/wk Moderate Activity    . LDL CALC < 100       This is a list of the screening recommended for you and due dates:  Health Maintenance  Topic Date Due  . Tetanus Vaccine  10/06/2019*  . Pneumonia vaccines (1 of 2 - PCV13) 10/06/2019*  . Flu Shot  11/07/2018  . Mammogram  11/19/2019  . Colon Cancer Screening  11/05/2022  . DEXA scan (bone density measurement)  Completed  .  Hepatitis C: One time screening is recommended by Center for Disease Control  (CDC) for  adults born from [birth year] through 1964.   Completed  *Topic was postponed. The date shown is not the original due date.      Preventing High Cholesterol Cholesterol is a white, waxy substance similar to fat that the human body needs to help build cells. The liver makes all the cholesterol that a person's body needs. Having high cholesterol (hypercholesterolemia) increases a person's risk for heart disease and stroke. Extra (excess) cholesterol comes from the food the person eats. High cholesterol can often be prevented with diet and lifestyle changes. If you already have high cholesterol, you can control it with diet and lifestyle changes and with medicine. How can high cholesterol affect me? If you have high cholesterol, deposits (plaques) may build up on the walls of your arteries. The arteries are the blood vessels that carry blood away from your heart. Plaques make the arteries narrower and stiffer. This can limit or block blood flow and cause blood clots to form. Blood clots:  Are tiny balls of cells that form in your blood.  Can move to the heart or brain, causing a heart attack or stroke. Plaques in arteries greatly increase  your risk for heart attack and stroke.Making diet and lifestyle changes can reduce your risk for these conditions that may threaten your life. What can increase my risk? This condition is more likely to develop in people who:  Eat foods that are high in saturated fat or cholesterol. Saturated fat is mostly found in: ? Foods that contain animal fat, such as red meat and some dairy products. ? Certain fatty foods made from plants, such as tropical oils.  Are overweight.  Are not getting enough exercise.  Have a family history of high cholesterol. What actions can I take to prevent this? Nutrition   Eat less saturated fat.  Avoid trans fats (partially hydrogenated oils). These are often found in margarine and in some baked goods, fried foods, and snacks bought in packages.  Avoid precooked or cured meat, such as sausages or meat loaves.  Avoid foods and drinks that have added sugars.  Eat more fruits, vegetables, and whole grains.  Choose healthy sources of protein, such as fish, poultry, lean cuts of red meat, beans, peas, lentils, and nuts.  Choose healthy sources of fat, such as: ? Nuts. ? Vegetable oils, especially olive oil. ? Fish that have healthy fats (omega-3 fatty acids), such as mackerel or salmon. The items listed above may not be a complete list of recommended foods and beverages. Contact a dietitian for more information. Lifestyle  Lose weight if you are  overweight. Losing 5-10 lb (2.3-4.5 kg) can help prevent or control high cholesterol. It can also lower your risk for diabetes and high blood pressure. Ask your health care provider to help you with a diet and exercise plan to lose weight safely.  Do not use any products that contain nicotine or tobacco, such as cigarettes, e-cigarettes, and chewing tobacco. If you need help quitting, ask your health care provider.  Limit your alcohol intake. ? Do not drink alcohol if:  Your health care provider tells you not to  drink.  You are pregnant, may be pregnant, or are planning to become pregnant. ? If you drink alcohol:  Limit how much you use to:  0-1 drink a day for women.  0-2 drinks a day for men.  Be aware of how much alcohol is in your drink. In the [place] one drink equals one 12 oz bottle of beer (355 mL), one 5 oz glass of wine (148 mL), or one 1 oz glass of hard liquor (44 mL). Activity   Get enough exercise. Each week, do at least 150 minutes of exercise that takes a medium level of effort (moderate-intensity exercise). ? This is exercise that:  Makes your heart beat faster and makes you breathe harder than usual.  Allows you to still be able to talk. ? You could exercise in short sessions several times a day or longer sessions a few times a week. For example, on 5 days each week, you could walk fast or ride your bike 3 times a day for 10 minutes each time.  Do exercises as told by your health care provider. Medicines  In addition to diet and lifestyle changes, your health care provider may recommend medicines to help lower cholesterol. This may be a medicine to lower the amount of cholesterol your liver makes. You may need medicine if: ? Diet and lifestyle changes do not lower your cholesterol enough. ? You have high cholesterol and other risk factors for heart disease or stroke.  Take over-the-counter and prescription medicines only as told by your health care provider. General information  Manage your risk factors for high cholesterol. Talk with your health care provider about all your risk factors and how to lower your risk.  Manage other conditions that you have, such as diabetes or high blood pressure (hypertension).  Have blood tests to check your cholesterol levels at regular points in time as told by your health care provider.  Keep all follow-up visits as told by your health care provider. This is important. Where to find more information  American Heart Association:  www.heart.org  National Heart, Lung, and Blood Institute: https://wilson-eaton.com/ Summary  High cholesterol increases your risk for heart disease and stroke. By keeping your cholesterol level low, you can reduce your risk for these conditions.  High cholesterol can often be prevented with diet and lifestyle changes.  Work with your health care provider to manage your risk factors, and have your blood tested regularly. This information is not intended to replace advice given to you by your health care provider. Make sure you discuss any questions you have with your health care provider. Document Released: 04/09/2015 Document Revised: 07/17/2018 Document Reviewed: 12/02/2015 Elsevier Patient Education  2020 [employer].

## 2018-10-07 LAB — CBC WITH DIFFERENTIAL/PLATELET
Absolute Monocytes: 430 {cells}/uL (ref 200–950)
Basophils Absolute: 40 {cells}/uL (ref 0–200)
Basophils Relative: 0.8 %
Eosinophils Absolute: 70 {cells}/uL (ref 15–500)
Eosinophils Relative: 1.4 %
HCT: 38.4 % (ref 35.0–45.0)
Hemoglobin: 13.2 g/dL (ref 11.7–15.5)
Lymphs Abs: 1465 {cells}/uL (ref 850–3900)
MCH: 32.4 pg (ref 27.0–33.0)
MCHC: 34.4 g/dL (ref 32.0–36.0)
MCV: 94.3 fL (ref 80.0–100.0)
MPV: 10 fL (ref 7.5–12.5)
Monocytes Relative: 8.6 %
Neutro Abs: 2995 {cells}/uL (ref 1500–7800)
Neutrophils Relative %: 59.9 %
Platelets: 296 10*3/uL (ref 140–400)
RBC: 4.07 Million/uL (ref 3.80–5.10)
RDW: 12.3 % (ref 11.0–15.0)
Total Lymphocyte: 29.3 %
WBC: 5 10*3/uL (ref 3.8–10.8)

## 2018-10-07 LAB — COMPLETE METABOLIC PANEL WITH GFR
AG Ratio: 2 (calc) (ref 1.0–2.5)
ALT: 12 U/L (ref 6–29)
AST: 15 U/L (ref 10–35)
Albumin: 4.8 g/dL (ref 3.6–5.1)
Alkaline phosphatase (APISO): 98 U/L (ref 37–153)
BUN/Creatinine Ratio: 31 (calc) — ABNORMAL HIGH (ref 6–22)
BUN: 14 mg/dL (ref 7–25)
CO2: 30 mmol/L (ref 20–32)
Calcium: 10.1 mg/dL (ref 8.6–10.4)
Chloride: 103 mmol/L (ref 98–110)
Creat: 0.45 mg/dL — ABNORMAL LOW (ref 0.60–0.93)
GFR, Est African American: 117 mL/min/{1.73_m2} (ref >=60)
GFR, Est Non African American: 101 mL/min/{1.73_m2} (ref >=60)
Globulin: 2.4 g/dL (calc) (ref 1.9–3.7)
Glucose, Bld: 85 mg/dL (ref 65–99)
Potassium: 4.8 mmol/L (ref 3.5–5.3)
Sodium: 141 mmol/L (ref 135–146)
Total Bilirubin: 0.4 mg/dL (ref 0.2–1.2)
Total Protein: 7.2 g/dL (ref 6.1–8.1)

## 2018-10-07 LAB — MAGNESIUM: Magnesium: 2.4 mg/dL (ref 1.5–2.5)

## 2018-10-07 LAB — LIPID PANEL
Cholesterol: 168 mg/dL (ref <200)
HDL: 50 mg/dL (ref >=50)
LDL Cholesterol (Calc): 85 mg/dL (calc)
Non-HDL Cholesterol (Calc): 118 mg/dL (calc) (ref <130)
Total CHOL/HDL Ratio: 3.4 (calc) (ref <5.0)
Triglycerides: 248 mg/dL — ABNORMAL HIGH (ref <150)

## 2018-10-07 LAB — VITAMIN D 25 HYDROXY (VIT D DEFICIENCY, FRACTURES): Vit D, 25-Hydroxy: 49 ng/mL (ref 30–100)

## 2018-10-07 LAB — TSH: TSH: 2.26 mIU/L (ref 0.40–4.50)

## 2018-10-12 ENCOUNTER — Other Ambulatory Visit: Payer: Self-pay | Admitting: Adult Health

## 2018-10-12 DIAGNOSIS — Z1231 Encounter for screening mammogram for malignant neoplasm of breast: Secondary | ICD-10-CM

## 2018-11-26 ENCOUNTER — Ambulatory Visit: Payer: Medicare Other

## 2018-12-26 ENCOUNTER — Other Ambulatory Visit: Payer: Self-pay | Admitting: Adult Health

## 2018-12-26 ENCOUNTER — Other Ambulatory Visit: Payer: Self-pay | Admitting: Internal Medicine

## 2018-12-26 DIAGNOSIS — F325 Major depressive disorder, single episode, in full remission: Secondary | ICD-10-CM

## 2018-12-31 ENCOUNTER — Ambulatory Visit
Admission: RE | Admit: 2018-12-31 | Discharge: 2018-12-31 | Disposition: A | Discharge status: Home / Self Care | Payer: Medicare Other | Source: Ambulatory Visit | Attending: Adult Health | Admitting: Adult Health

## 2018-12-31 ENCOUNTER — Other Ambulatory Visit: Payer: Self-pay

## 2018-12-31 DIAGNOSIS — Z1231 Encounter for screening mammogram for malignant neoplasm of breast: Secondary | ICD-10-CM

## 2019-01-21 NOTE — Progress Notes (Deleted)
Assessment and Plan:  There are no diagnoses linked to this encounter.    Further disposition pending results of labs. Discussed med's effects and SE's.   Over 30 minutes of exam, counseling, chart review, and critical decision making was performed.   Future Appointments  Date Time Provider [place]  01/25/2019  3:00 PM Liane Comber, NP [facility] None  04/07/2019 11:00 AM Unk Pinto, MD [facility] None  10/12/2019 11:15 AM Liane Comber, NP [facility] None    ------------------------------------------------------------------------------------------------------------------   HPI There were no vitals taken for this visit.  73 y.o.female presents for  Last pap smear/pelvic exam: 2011 remote, declines another  Past Medical History:  Diagnosis Date  . Anxiety   . Breast cancer ([place])    left   . Depressive disorder, not elsewhere classified   . Diverticulosis of colon (without mention of hemorrhage) 2007   Colonoscopy  . Esophagitis, unspecified 2001   EGD  . GERD (gastroesophageal reflux disease)   . Hx of colonic polyps 2003   Colonoscopy   . IBS (irritable bowel syndrome)   . Insomnia, unspecified   . Internal hemorrhoids without mention of complication [id number]   Colonoscopy   . Personal history of radiation therapy 04/08/2004  . Pure hypercholesterolemia   . Stricture and stenosis of esophagus 2001, 2007   EGD  . Unspecified essential hypertension      Allergies  Allergen Reactions  . Codeine   . Decongestant [Pseudoephedrine Hcl]     Current Outpatient Medications on File Prior to Visit  Medication Sig  . ALPRAZolam (XANAX) 1 MG tablet Take 1/2 to 1 tablet 1 to 2x /day  for Anxiety or Sleep & please try to limit to 5 days /week to avoid addiction - Should last longer than 30 days  . amitriptyline (ELAVIL) 10 MG tablet Take 1 to 2 tablets at Bedtime as directed for sleep  . cholecalciferol (VITAMIN D) 1000 units tablet Take 2,400 Units by  mouth daily.   Marland Kitchen escitalopram (LEXAPRO) 20 MG tablet Take 1 tablet Daily for Mood  . Magnesium 250 MG TABS Take 250 mg by mouth daily.  . Multiple Vitamin (MULTIVITAMIN) tablet Take 1 tablet by mouth daily.  . rosuvastatin (CRESTOR) 20 MG tablet Take 1 tablet Daily for Cholesterol  . vitamin C (ASCORBIC ACID) 500 MG tablet Take 500 mg by mouth daily.   No current facility-administered medications on file prior to visit.     ROS: all negative except above.   Physical Exam:  There were no vitals taken for this visit.  General Appearance: Well nourished, in no apparent distress. Eyes: PERRLA, EOMs, conjunctiva no swelling or erythema Sinuses: No Frontal/maxillary tenderness ENT/Mouth: Ext aud canals clear, TMs without erythema, bulging. No erythema, swelling, or exudate on post pharynx.  Tonsils not swollen or erythematous. Hearing normal.  Neck: Supple, thyroid normal.  Respiratory: Respiratory effort normal, BS equal bilaterally without rales, rhonchi, wheezing or stridor.  Cardio: RRR with no MRGs. Brisk peripheral pulses without edema.  Abdomen: Soft, + BS.  Non tender, no guarding, rebound, hernias, masses. Lymphatics: Non tender without lymphadenopathy.  Musculoskeletal: Full ROM, 5/5 strength, normal gait.  Skin: Warm, dry without rashes, lesions, ecchymosis.  Neuro: Cranial nerves intact. Normal muscle tone, no cerebellar symptoms. Sensation intact.  Psych: Awake and oriented X 3, normal affect, Insight and Judgment appropriate.     Izora Ribas, NP 1:37 PM [facility]

## 2019-01-25 ENCOUNTER — Ambulatory Visit: Payer: Medicare Other | Admitting: Adult Health

## 2019-01-25 NOTE — Progress Notes (Signed)
Assessment and Plan:  Kieren was seen today for acute visit.  Diagnoses and all orders for this visit:  Pelvic pressure in female/Urinary urgency Will check UA to r/o UTI though suspect symptoms related to mild pelvic organ prolapse, 1st degree without appreciable cystocele or rectocele -     Urinalysis w microscopic + reflex cultur  Female genital prolapse, unspecified type mild pelvic organ prolapse, 1st degree without appreciable cystocele or rectocele Otherwise unremarkable exam Discussed conservative interventions including Kegels, pessary, pelvic floor muscle training; also discussed possible benefit with medications She declines PT or referral for fitment of pessary She would like to try kegel's for 6 weeks; also given 2 week supply of myrbetriq 50 mg samples Information provided on AVS; she will follow up if persistent and plan to proceed with PT referral for PFMT Follow up for any new symptoms  Further disposition pending results of labs. Discussed med's effects and SE's.   Over 30 minutes of exam, counseling, chart review, and critical decision making was performed.   Future Appointments  Date Time Provider [place]  04/07/2019 11:00 AM Unk Pinto, MD [facility] None  10/12/2019 11:15 AM Liane Comber, NP [facility] None    ------------------------------------------------------------------------------------------------------------------   HPI BP 118/74   Pulse 87   Temp 97.7 F (36.5 C)   Ht 5\' 1"  (1.549 m)   Wt 118 lb (53.5 kg)   SpO2 97%   BMI 22.30 kg/m   73 y.o.female presents for evaluation due to 1 month of sensation of pelvic pressure prior to urinating and urinary urgency x 1 month. She denies dysuria, frequency, hematuria, urine character changes, back pain, fever/chills. Denies any incontinence. Denies any changes with bowel, no constipation, normal shape and consistency. She denies vaginal discharge; not currently sexually active, last was  was remote.   Last pap smear/pelvic exam: 2011 remote, never abnormal, had been declining another due to age and low risk hx. No family history of GYN cancers or bladder questions. She had 1 vaginal birth.   She has history of frequency associated with anxiety for many years, is on amitriptyline 10-20 mg at night to help with nocturnal symptoms, xanax PRN.   She is a former smoker, quit in 2013 with 14 pack year history.   She had normal colonoscopy in 2014, due in 2024.   BMI is Body mass index is 22.3 kg/m. Wt Readings from Last 3 Encounters:  01/26/19 118 lb (53.5 kg)  10/06/18 117 lb 3.2 oz (53.2 kg)  04/06/18 119 lb (54 kg)     Past Medical History:  Diagnosis Date  . Anxiety   . Breast cancer ([place])    left   . Depressive disorder, not elsewhere classified   . Diverticulosis of colon (without mention of hemorrhage) 2007   Colonoscopy  . Esophagitis, unspecified 2001   EGD  . GERD (gastroesophageal reflux disease)   . Hx of colonic polyps 2003   Colonoscopy   . IBS (irritable bowel syndrome)   . Insomnia, unspecified   . Internal hemorrhoids without mention of complication [id number]   Colonoscopy   . Personal history of radiation therapy 04/08/2004  . Pure hypercholesterolemia   . Stricture and stenosis of esophagus 2001, 2007   EGD  . Unspecified essential hypertension      Allergies  Allergen Reactions  . Codeine   . Decongestant [Pseudoephedrine Hcl]     Current Outpatient Medications on File Prior to Visit  Medication Sig  . ALPRAZolam (XANAX) 1 MG tablet Take  1/2 to 1 tablet 1 to 2x /day  for Anxiety or Sleep & please try to limit to 5 days /week to avoid addiction - Should last longer than 30 days  . amitriptyline (ELAVIL) 10 MG tablet Take 1 to 2 tablets at Bedtime as directed for sleep  . cholecalciferol (VITAMIN D) 1000 units tablet Take 2,400 Units by mouth daily.   Marland Kitchen escitalopram (LEXAPRO) 20 MG tablet Take 1 tablet Daily for Mood  . Magnesium 250 MG  TABS Take 250 mg by mouth daily.  . Multiple Vitamin (MULTIVITAMIN) tablet Take 1 tablet by mouth daily.  . rosuvastatin (CRESTOR) 20 MG tablet Take 1 tablet Daily for Cholesterol  . vitamin C (ASCORBIC ACID) 500 MG tablet Take 500 mg by mouth daily.   No current facility-administered medications on file prior to visit.     ROS: all negative except above.   Physical Exam:  BP 118/74   Pulse 87   Temp 97.7 F (36.5 C)   Ht 5\' 1"  (1.549 m)   Wt 118 lb (53.5 kg)   SpO2 97%   BMI 22.30 kg/m   General Appearance: Well nourished, in no apparent distress. Eyes: conjunctiva no swelling or erythema ENT/Mouth: Hearing normal.  Neck: Supple, thyroid normal.  Respiratory: Respiratory effort normal, BS equal bilaterally without rales, rhonchi, wheezing or stridor.  Cardio: RRR with no MRGs. Brisk peripheral pulses without edema.  Abdomen: Soft, + BS.  Mild LLQ tenderness, no guarding, rebound, hernias, masses. Bladder non-distended.  Female genitalia: Vulva: normal appearing vulva with no masses, tenderness or lesions Vagina: atrophic -  Pelvic Floor Exam - vaginal prolapse of 1st degree without appreciable cystocele or rectocele Cervix: normal appearing cervix with parous os without discharge or lesions and cervical motion tenderness absent Uterus: uterus is normal size, shape, consistency and nontender Adnexa: nonpalpable Rectal: normal rectal, no masses and sphincter tone normal Lymphatics: Non tender without lymphadenopathy.  Musculoskeletal: Full ROM in hips, normal gait.  Skin: Warm, dry without rashes, lesions, ecchymosis.  Neuro: Normal muscle tone Psych: Awake and oriented X 3, normal affect, Insight and Judgment appropriate.     Izora Ribas, NP 4:54 PM [facility]

## 2019-01-26 ENCOUNTER — Encounter: Payer: Self-pay | Admitting: Adult Health

## 2019-01-26 ENCOUNTER — Ambulatory Visit (INDEPENDENT_AMBULATORY_CARE_PROVIDER_SITE_OTHER): Payer: Medicare Other | Admitting: Adult Health

## 2019-01-26 ENCOUNTER — Other Ambulatory Visit: Payer: Self-pay

## 2019-01-26 VITALS — BP 118/74 | HR 87 | Temp 97.7°F | Ht 61.0 in | Wt 118.0 lb

## 2019-01-26 DIAGNOSIS — R102 Pelvic and perineal pain: Secondary | ICD-10-CM

## 2019-01-26 DIAGNOSIS — N819 Female genital prolapse, unspecified: Secondary | ICD-10-CM

## 2019-01-26 DIAGNOSIS — R3915 Urgency of urination: Secondary | ICD-10-CM

## 2019-01-26 HISTORY — DX: Female genital prolapse, unspecified: N81.9

## 2019-01-26 NOTE — Patient Instructions (Signed)
You have mild stage 1 pelvic organ prolapse  Can try myrbetriq 50 mg daily x 2 weeks; this may help with urgency but will not help pressure  Can try Kegel exercises   Will refer to physical therapy to learn some exercises if not getting better    Pelvic Organ Prolapse Pelvic organ prolapse is the stretching, bulging, or dropping of pelvic organs into an abnormal position. It happens when the muscles and tissues that surround and support pelvic structures become weak or stretched. Pelvic organ prolapse can involve the:  Vagina (vaginal prolapse).  Uterus (uterine prolapse).  Bladder (cystocele).  Rectum (rectocele).  Intestines (enterocele). When organs other than the vagina are involved, they often bulge into the vagina or protrude from the vagina, depending on how severe the prolapse is. What are the causes? This condition may be caused by:  Pregnancy, labor, and childbirth.  Past pelvic surgery.  Decreased production of the hormone estrogen associated with menopause.  Consistently lifting more than 50 lb (23 kg).  Obesity.  Long-term inability to pass stool (chronic constipation).  A cough that lasts a long time (chronic).  Buildup of fluid in the abdomen due to certain diseases and other conditions. What are the signs or symptoms? Symptoms of this condition include:  Passing a little urine (loss of bladder control) when you cough, sneeze, strain, and exercise (stress incontinence). This may be worse immediately after childbirth. It may gradually improve over time.  Feeling pressure in your pelvis or vagina. This pressure may increase when you cough or when you are passing stool.  A bulge that protrudes from the opening of your vagina.  Difficulty passing urine or stool.  Pain in your lower back.  Pain, discomfort, or disinterest in sex.  Repeated bladder infections (urinary tract infections).  Difficulty inserting a tampon. In some people, this condition  causes no symptoms. How is this diagnosed? This condition may be diagnosed based on a vaginal and rectal exam. During the exam, you may be asked to cough and strain while you are lying down, sitting, and standing up. Your health care provider will determine if other tests are required, such as bladder function tests. How is this treated? Treatment for this condition may depend on your symptoms. Treatment may include:  Lifestyle changes, such as changes to your diet.  Emptying your bladder at scheduled times (bladder training therapy). This can help reduce or avoid urinary incontinence.  Estrogen. Estrogen may help mild prolapse by increasing the strength and tone of pelvic floor muscles.  Kegel exercises. These may help mild cases of prolapse by strengthening and tightening the muscles of the pelvic floor.  A soft, flexible device that helps support the vaginal walls and keep pelvic organs in place (pessary). This is inserted into your vagina by your health care provider.  Surgery. This is often the only form of treatment for severe prolapse. Follow these instructions at home:  Avoid drinking beverages that contain caffeine or alcohol.  Increase your intake of high-fiber foods. This can help decrease constipation and straining during bowel movements.  Lose weight if recommended by your health care provider.  Wear a sanitary pad or adult diapers if you have urinary incontinence.  Avoid heavy lifting and straining with exercise and work. Do not hold your breath when you perform mild to moderate lifting and exercise activities. Limit your activities as directed by your health care provider.  Do Kegel exercises as directed by your health care provider. To do this: ? Squeeze  your pelvic floor muscles tight. You should feel a tight lift in your rectal area and a tightness in your vaginal area. Keep your stomach, buttocks, and legs relaxed. ? Hold the muscles tight for up to 10 seconds. ?  Relax your muscles. ? Repeat this exercise 50 times a day, or as many times as told by your health care provider. Continue to do this exercise for at least 4-6 weeks, or for as long as told by your health care provider.  Take over-the-counter and prescription medicines only as told by your health care provider.  If you have a pessary, take care of it as told by your health care provider.  Keep all follow-up visits as told by your health care provider. This is important. Contact a health care provider if you:  Have symptoms that interfere with your daily activities or sex life.  Need medicine to help with the discomfort.  Notice bleeding from your vagina that is not related to your period.  Have a fever.  Have pain or bleeding when you urinate.  Have bleeding when you pass stool.  Pass urine when you have sex.  Have chronic constipation.  Have a pessary that falls out.  Have bad smelling vaginal discharge.  Have an unusual, low pain in your abdomen. Summary  Pelvic organ prolapse is the stretching, bulging, or dropping of pelvic organs into an abnormal position. It happens when the muscles and tissues that surround and support pelvic structures become weak or stretched.  When organs other than the vagina are involved, they often bulge into the vagina or protrude from the vagina, depending on how severe the prolapse is.  In most cases, this condition needs to be treated only if it produces symptoms. Treatment may include lifestyle changes, estrogen, Kegel exercises, pessary insertion, or surgery.  Avoid heavy lifting and straining with exercise and work. Do not hold your breath when you perform mild to moderate lifting and exercise activities. Limit your activities as directed by your health care provider. This information is not intended to replace advice given to you by your health care provider. Make sure you discuss any questions you have with your health care provider.  Document Released: 10/20/2013 Document Revised: 04/16/2017 Document Reviewed: 04/16/2017 Elsevier Patient Education  [place].     Mirabegron extended-release tablets What is this medicine? MIRABEGRON (MIR a BEG ron) is used to treat overactive bladder. This medicine reduces the amount of bathroom visits. It may also help to control wetting accidents. It may be used alone, but sometimes may be given with other treatments. This medicine may be used for other purposes; ask your health care provider or pharmacist if you have questions. COMMON BRAND NAME(S): Myrbetriq What should I tell my health care provider before I take this medicine? They need to know if you have any of these conditions:  high blood pressure  kidney disease  liver disease  problems urinating  prostate disease  an unusual or allergic reaction to mirabegron, other medicines, foods, dyes, or preservatives  pregnant or trying to get pregnant  breast-feeding How should I use this medicine? Take this medicine by mouth with a glass of water. Follow the directions on the prescription label. Do not cut, crush or chew this medicine. You can take it with or without food. If it upsets your stomach, take it with food. Take your medicine at regular intervals. Do not take it more often than directed. Do not stop taking except on your doctor's advice.  Talk to your pediatrician regarding the use of this medicine in children. Special care may be needed. Overdosage: If you think you have taken too much of this medicine contact a poison control center or emergency room at once. NOTE: This medicine is only for you. Do not share this medicine with others. What if I miss a dose? If you miss a dose, take it as soon as you can. If it is almost time for your next dose, take only that dose. Do not take double or extra doses. What may interact with this medicine?  codeine  desipramine  digoxin  flecainide  MAOIs like  Carbex, Eldepryl, Marplan, Nardil, and Parnate  methadone  metoprolol  pimozide  propafenone  thioridazine  warfarin This list may not describe all possible interactions. Give your health care provider a list of all the medicines, herbs, non-prescription drugs, or dietary supplements you use. Also tell them if you smoke, drink alcohol, or use illegal drugs. Some items may interact with your medicine. What should I watch for while using this medicine? Visit your doctor or health care professional for regular checks on your progress. Check your blood pressure as directed. Ask your doctor or health care professional what your blood pressure should be and when you should contact him or her. You may need to limit your intake of tea, coffee, caffeinated sodas, or alcohol. These drinks may make your symptoms worse. What side effects may I notice from receiving this medicine? Side effects that you should report to your doctor or health care professional as soon as possible:  allergic reactions like skin rash, itching or hives, swelling of the face, lips, or tongue  high blood pressure  fast, irregular heartbeat  redness, blistering, peeling or loosening of the skin, including inside the mouth  signs of infection like fever or chills; pain or difficulty passing urine  trouble passing urine or change in the amount of urine Side effects that usually do not require medical attention (report to your doctor or health care professional if they continue or are bothersome):  constipation  dry mouth  headache  runny nose  stomach upset This list may not describe all possible side effects. Call your doctor for medical advice about side effects. You may report side effects to FDA at 1-800-FDA-1088. Where should I keep my medicine? Keep out of the reach of children. Store at room temperature between 15 and 30 degrees C (59 and 86 degrees F). Throw away any unused medicine after the expiration  date. NOTE: This sheet is a summary. It may not cover all possible information. If you have questions about this medicine, talk to your doctor, pharmacist, or health care provider.  2020 Elsevier/Gold Standard (2016-08-15 11:33:21)

## 2019-01-28 LAB — URINALYSIS W MICROSCOPIC + REFLEX CULTURE
Bacteria, UA: NONE SEEN /HPF
Bilirubin Urine: NEGATIVE
Glucose, UA: NEGATIVE
Hyaline Cast: NONE SEEN /LPF
Nitrites, Initial: NEGATIVE
Protein, ur: NEGATIVE
Specific Gravity, Urine: 1.023 (ref 1.001–1.03)
Squamous Epithelial / LPF: NONE SEEN /HPF (ref <=5)
pH: <=5 (ref 5.0–8.0)

## 2019-01-28 LAB — URINE CULTURE
MICRO NUMBER:: 1011369
SPECIMEN QUALITY:: ADEQUATE

## 2019-01-28 LAB — CULTURE INDICATED

## 2019-02-15 ENCOUNTER — Other Ambulatory Visit: Payer: Self-pay | Admitting: Internal Medicine

## 2019-02-15 DIAGNOSIS — G4700 Insomnia, unspecified: Secondary | ICD-10-CM

## 2019-03-29 ENCOUNTER — Other Ambulatory Visit: Payer: Self-pay | Admitting: Adult Health

## 2019-03-29 DIAGNOSIS — G4700 Insomnia, unspecified: Secondary | ICD-10-CM

## 2019-04-06 ENCOUNTER — Encounter: Payer: Self-pay | Admitting: Internal Medicine

## 2019-04-06 NOTE — Patient Instructions (Signed)

## 2019-04-06 NOTE — Progress Notes (Signed)
Annual Screening/Preventative Visit & Comprehensive Evaluation &  Examination     This very nice 73 y.o. DWF  presents for a Screening /Preventative Visit & comprehensive evaluation and management of multiple medical co-morbidities.  Patient has been followed for labile HTN, HLD, Prediabetes  and Vitamin D Deficiency.      HTN predates since 2000. Patient's BP has been controlled at home and patient denies any cardiac symptoms as chest pain, palpitations, shortness of breath, dizziness or ankle swelling. Today's BP is at goal - 116/72.      Patient's hyperlipidemia is controlled with diet and medications. Patient denies myalgias or other medication SE's. Last lipids were at goal except elevated Trig's:  Lab Results  Component Value Date   CHOL 168 10/06/2018   HDL 50 10/06/2018   LDLCALC 85 10/06/2018   TRIG 248 (H) 10/06/2018   CHOLHDL 3.4 10/06/2018      Patient has hx/o prediabetes (A1c 5.7% / 2013)  and patient denies reactive hypoglycemic symptoms, visual blurring, diabetic polys or paresthesias. Last A1c was Normal & at goal:  Lab Results  Component Value Date   HGBA1C 5.4 04/06/2018      Finally, patient has history of Vitamin D Deficiency ("37" in June 2019)  and last Vitamin D was slightly low (goal 70-100):  Lab Results  Component Value Date   VD25OH 49 10/06/2018   Current Outpatient Medications on File Prior to Visit  Medication Sig  . ALPRAZolam  1 MG  TAKE 1/2 TO 1 TAB 1 TO 2 X /DAY FOR ANXIETY OR SLEEP  . ELAVIL 10 MG  Take 1 to 2 tablets at Bedtimefor sleep  . VITAMIN D 1000 units Take 1,000 Units  3  times daily.   Marland Kitchen escitalopram  20 MG Take 1 tablet Daily for Mood  . Magnesium 250 MG Take daily.  . MULTIVITAMIN Take 1 tablet  daily.  . rosuvastatin  20 MG  Take 1 tablet Daily for Cholesterol  . vitamin C 500 MG Take 500 mg by mouth 2 (two) times daily.     Allergies  Allergen Reactions  . Codeine   . Decongestant [Pseudoephedrine Hcl]    Past Medical  History:  Diagnosis Date  . Anxiety   . Breast cancer ([place])    left   . Depressive disorder, not elsewhere classified   . Diverticulosis of colon (without mention of hemorrhage) 2007   Colonoscopy  . Esophagitis, unspecified 2001   EGD  . Female genital prolapse 01/26/2019  . GERD (gastroesophageal reflux disease)   . Hx of colonic polyps 2003   Colonoscopy   . IBS (irritable bowel syndrome)   . Insomnia, unspecified   . Internal hemorrhoids without mention of complication [id number]   Colonoscopy   . Personal history of radiation therapy 04/08/2004  . Pure hypercholesterolemia   . Stricture and stenosis of esophagus 2001, 2007   EGD  . Unspecified essential hypertension    Health Maintenance  Topic Date Due  . INFLUENZA VACCINE  11/07/2018  . TETANUS/TDAP  10/06/2019 (Originally 10/22/1964)  . PNA vac Low Risk Adult (1 of 2 - PCV13) 10/06/2019 (Originally 10/23/2011)  . MAMMOGRAM  12/30/2020  . COLONOSCOPY  11/05/2022  . DEXA SCAN  Completed  . Hepatitis C Screening  Completed   Last Colon - 11/04/2012 - Dr Deatra Ina - Recc 10 yr f/u due July 2024  Last MGM - 12/31/2018  Past Surgical History:  Procedure Laterality Date  . ABDOMINAL HYSTERECTOMY    .  BREAST LUMPECTOMY  2005   left  . CHOLECYSTECTOMY  2004  . NASAL SINUS SURGERY     Family History  Problem Relation Age of Onset  . Bipolar disorder Mother   . Breast cancer Mother   . Heart disease Mother   . Heart disease Father 44  . Parkinson's disease Sister   . Breast cancer Maternal Aunt   . Breast cancer Maternal Aunt   . Breast cancer Maternal Aunt   . Breast cancer Maternal Aunt   . Colon cancer Neg Hx    Social History   Tobacco Use  . Smoking status: Former Smoker    Packs/day: 0.40    Years: 35.00    Pack years: 14.00    Quit date: 03/28/2012    Years since quitting: 7.0  . Smokeless tobacco: Never Used  Substance Use Topics  . Alcohol use: Yes    Alcohol/week: 1.0 standard drinks    Types: 1  Glasses of wine per week  . Drug use: No    ROS Constitutional: Denies fever, chills, weight loss/gain, headaches, insomnia,  night sweats, and change in appetite. Does c/o fatigue. Eyes: Denies redness, blurred vision, diplopia, discharge, itchy, watery eyes.  ENT: Denies discharge, congestion, post nasal drip, epistaxis, sore throat, earache, hearing loss, dental pain, Tinnitus, Vertigo, Sinus pain, snoring.  Cardio: Denies chest pain, palpitations, irregular heartbeat, syncope, dyspnea, diaphoresis, orthopnea, PND, claudication, edema Respiratory: denies cough, dyspnea, DOE, pleurisy, hoarseness, laryngitis, wheezing.  Gastrointestinal: Denies dysphagia, heartburn, reflux, water brash, pain, cramps, nausea, vomiting, bloating, diarrhea, constipation, hematemesis, melena, hematochezia, jaundice, hemorrhoids Genitourinary: Denies dysuria, frequency, urgency, nocturia, hesitancy, discharge, hematuria, flank pain Breast: Breast lumps, nipple discharge, bleeding.  Musculoskeletal: Denies arthralgia, myalgia, stiffness, Jt. Swelling, pain, limp, and strain/sprain. Denies falls. Skin: Denies puritis, rash, hives, warts, acne, eczema, changing in skin lesion Neuro: No weakness, tremor, incoordination, spasms, paresthesia, pain Psychiatric: Denies confusion, memory loss, sensory loss. Denies Depression. Endocrine: Denies change in weight, skin, hair change, nocturia, and paresthesia, diabetic polys, visual blurring, hyper / hypo glycemic episodes.  Heme/Lymph: No excessive bleeding, bruising, enlarged lymph nodes.  Physical Exam  BP 116/72   Pulse 84   Temp (!) 97.2 F (36.2 C)   Resp 16   Ht 5\' 1"  (1.549 m)   Wt 116 lb 6.4 oz (52.8 kg)   BMI 21.99 kg/m   General Appearance: Well nourished, well groomed and in no apparent distress.  Eyes: PERRLA, EOMs, conjunctiva no swelling or erythema, normal fundi and vessels. Sinuses: No frontal/maxillary tenderness ENT/Mouth: EACs patent / TMs  nl.  Nares clear without erythema, swelling, mucoid exudates. Oral hygiene is good. No erythema, swelling, or exudate. Tongue normal, non-obstructing. Tonsils not swollen or erythematous. Hearing normal.  Neck: Supple, thyroid not palpable. No bruits, nodes or JVD. Respiratory: Respiratory effort normal.  BS equal and clear bilateral without rales, rhonci, wheezing or stridor. Cardio: Heart sounds are normal with regular rate and rhythm and no murmurs, rubs or gallops. Peripheral pulses are normal and equal bilaterally without edema. No aortic or femoral bruits. Chest: symmetric with normal excursions and percussion. Breasts: Symmetric, without lumps, nipple discharge, retractions, or fibrocystic changes.  Abdomen: Flat, soft with bowel sounds active. Nontender, no guarding, rebound, hernias, masses, or organomegaly.  Lymphatics: Non tender without lymphadenopathy.  Genitourinary:  Musculoskeletal: Full ROM all peripheral extremities, joint stability, 5/5 strength, and normal gait. Skin: Warm and dry without rashes, lesions, cyanosis, clubbing or  ecchymosis.  Neuro: Cranial nerves intact, reflexes equal bilaterally.  Normal muscle tone, no cerebellar symptoms. Sensation intact.  Pysch: Alert and oriented X 3, normal affect, Insight and Judgment appropriate.   Assessment and Plan  1. Annual Preventative Screening Examination  2. Labile hypertension  - EKG 12-Lead - Urinalysis, Routine w reflex microscopic - Microalbumin / Creatinine Urine Ratio - POC Hemoccult Bld/Stl (3-Cd Home Screen); Future - CBC with Diff - COMPLETE METABOLIC PANEL WITH GFR - Magnesium  3. Hyperlipidemia, mixed  - EKG 12-Lead - Urinalysis, Routine w reflex microscopic - Lipid Profile - TSH  4. Abnormal glucose  - EKG 12-Lead - Urinalysis, Routine w reflex microscopic - Hemoglobin A1c ([facility]) - Insulin, random  5. Vitamin D deficiency  - Vitamin D (25 hydroxy)  6. Prediabetes  - EKG 12-Lead -  Urinalysis, Routine w reflex microscopic - Hemoglobin A1c ([facility]) - Insulin, random  7. Screening for colorectal cancer  - POC Hemoccult Bld/Stl   8. Screening for ischemic heart disease  - EKG 12-Lead - Urinalysis, Routine w reflex microscopic  9. FHx: heart disease  - EKG 12-Lead - Urinalysis, Routine w reflex microscopic  10. Former smoker  - EKG 12-Lead - Urinalysis, Routine w reflex microscopic  11. Aortic atherosclerosis ([facility])  - EKG 12-Lead - Urinalysis, Routine w reflex microscopic  12. Medication management  - Microalbumin / Creatinine Urine Ratio - POC Hemoccult Bld/Stl (3-Cd Home Screen); Future - CBC with Diff - COMPLETE METABOLIC PANEL WITH GFR - Magnesium - Lipid Profile - TSH - Hemoglobin A1c ([facility]) - Insulin, random - Vitamin D (25 hydroxy)        Patient was counseled in prudent diet to achieve/maintain BMI less than 25 for weight control, BP monitoring, regular exercise and medications. Discussed med's effects and SE's. Screening labs and tests as requested with regular follow-up as recommended. Over 40 minutes of exam, counseling, chart review and high complex critical decision making was performed.   Kirtland Bouchard, MD

## 2019-04-07 ENCOUNTER — Ambulatory Visit (INDEPENDENT_AMBULATORY_CARE_PROVIDER_SITE_OTHER): Payer: Medicare Other | Admitting: Internal Medicine

## 2019-04-07 ENCOUNTER — Other Ambulatory Visit: Payer: Self-pay

## 2019-04-07 VITALS — BP 116/72 | HR 84 | Temp 97.2°F | Resp 16 | Ht 61.0 in | Wt 116.4 lb

## 2019-04-07 DIAGNOSIS — Z0001 Encounter for general adult medical examination with abnormal findings: Secondary | ICD-10-CM

## 2019-04-07 DIAGNOSIS — Z8249 Family history of ischemic heart disease and other diseases of the circulatory system: Secondary | ICD-10-CM | POA: Diagnosis not present

## 2019-04-07 DIAGNOSIS — R0989 Other specified symptoms and signs involving the circulatory and respiratory systems: Secondary | ICD-10-CM

## 2019-04-07 DIAGNOSIS — I700 Atherosclerosis of aorta: Secondary | ICD-10-CM

## 2019-04-07 DIAGNOSIS — Z1211 Encounter for screening for malignant neoplasm of colon: Secondary | ICD-10-CM

## 2019-04-07 DIAGNOSIS — Z87891 Personal history of nicotine dependence: Secondary | ICD-10-CM | POA: Diagnosis not present

## 2019-04-07 DIAGNOSIS — R7309 Other abnormal glucose: Secondary | ICD-10-CM

## 2019-04-07 DIAGNOSIS — I10 Essential (primary) hypertension: Secondary | ICD-10-CM

## 2019-04-07 DIAGNOSIS — Z0000 Encounter for general adult medical examination without abnormal findings: Secondary | ICD-10-CM | POA: Diagnosis not present

## 2019-04-07 DIAGNOSIS — E559 Vitamin D deficiency, unspecified: Secondary | ICD-10-CM

## 2019-04-07 DIAGNOSIS — E782 Mixed hyperlipidemia: Secondary | ICD-10-CM

## 2019-04-07 DIAGNOSIS — Z79899 Other long term (current) drug therapy: Secondary | ICD-10-CM

## 2019-04-07 DIAGNOSIS — Z136 Encounter for screening for cardiovascular disorders: Secondary | ICD-10-CM

## 2019-04-07 DIAGNOSIS — R7303 Prediabetes: Secondary | ICD-10-CM

## 2019-04-07 DIAGNOSIS — Z1212 Encounter for screening for malignant neoplasm of rectum: Secondary | ICD-10-CM

## 2019-04-08 LAB — COMPLETE METABOLIC PANEL WITH GFR
AG Ratio: 1.9 (calc) (ref 1.0–2.5)
ALT: 49 U/L — ABNORMAL HIGH (ref 6–29)
AST: 43 U/L — ABNORMAL HIGH (ref 10–35)
Albumin: 4.5 g/dL (ref 3.6–5.1)
Alkaline phosphatase (APISO): 119 U/L (ref 37–153)
BUN: 13 mg/dL (ref 7–25)
CO2: 28 mmol/L (ref 20–32)
Calcium: 9 mg/dL (ref 8.6–10.4)
Chloride: 105 mmol/L (ref 98–110)
Creat: 0.61 mg/dL (ref 0.60–0.93)
GFR, Est African American: 105 mL/min/{1.73_m2} (ref >=60)
GFR, Est Non African American: 91 mL/min/{1.73_m2} (ref >=60)
Globulin: 2.4 g/dL (calc) (ref 1.9–3.7)
Glucose, Bld: 107 mg/dL — ABNORMAL HIGH (ref 65–99)
Potassium: 3.9 mmol/L (ref 3.5–5.3)
Sodium: 143 mmol/L (ref 135–146)
Total Bilirubin: 0.5 mg/dL (ref 0.2–1.2)
Total Protein: 6.9 g/dL (ref 6.1–8.1)

## 2019-04-08 LAB — CBC WITH DIFFERENTIAL/PLATELET
Absolute Monocytes: 351 cells/uL (ref 200–950)
Basophils Absolute: 18 cells/uL (ref 0–200)
Basophils Relative: 0.4 %
Eosinophils Absolute: 72 cells/uL (ref 15–500)
Eosinophils Relative: 1.6 %
HCT: 37.9 % (ref 35.0–45.0)
Hemoglobin: 12.9 g/dL (ref 11.7–15.5)
Lymphs Abs: 1175 cells/uL (ref 850–3900)
MCH: 31.5 pg (ref 27.0–33.0)
MCHC: 34 g/dL (ref 32.0–36.0)
MCV: 92.4 fL (ref 80.0–100.0)
MPV: 9.9 fL (ref 7.5–12.5)
Monocytes Relative: 7.8 %
Neutro Abs: 2885 cells/uL (ref 1500–7800)
Neutrophils Relative %: 64.1 %
Platelets: 242 10*3/uL (ref 140–400)
RBC: 4.1 10*6/uL (ref 3.80–5.10)
RDW: 12.4 % (ref 11.0–15.0)
Total Lymphocyte: 26.1 %
WBC: 4.5 10*3/uL (ref 3.8–10.8)

## 2019-04-08 LAB — TSH: TSH: 2.63 mIU/L (ref 0.40–4.50)

## 2019-04-08 LAB — INSULIN, RANDOM: Insulin: 15.7 u[IU]/mL

## 2019-04-08 LAB — VITAMIN D 25 HYDROXY (VIT D DEFICIENCY, FRACTURES): Vit D, 25-Hydroxy: 65 ng/mL (ref 30–100)

## 2019-04-08 LAB — LIPID PANEL
Cholesterol: 118 mg/dL (ref <200)
HDL: 38 mg/dL — ABNORMAL LOW (ref >=50)
LDL Cholesterol (Calc): 61 mg/dL (calc)
Non-HDL Cholesterol (Calc): 80 mg/dL (calc) (ref <130)
Total CHOL/HDL Ratio: 3.1 (calc) (ref <5.0)
Triglycerides: 102 mg/dL (ref <150)

## 2019-04-08 LAB — MAGNESIUM: Magnesium: 2.2 mg/dL (ref 1.5–2.5)

## 2019-04-08 LAB — HEMOGLOBIN A1C
Hgb A1c MFr Bld: 5.7 % of total Hgb — ABNORMAL HIGH (ref <5.7)
Mean Plasma Glucose: 117 (calc)
eAG (mmol/L): 6.5 (calc)

## 2019-04-09 LAB — URINALYSIS, ROUTINE W REFLEX MICROSCOPIC
Bacteria, UA: NONE SEEN /HPF
Bilirubin Urine: NEGATIVE
Glucose, UA: NEGATIVE
Hyaline Cast: NONE SEEN /LPF
Ketones, ur: NEGATIVE
Nitrite: NEGATIVE
Protein, ur: NEGATIVE
Specific Gravity, Urine: 1.008 (ref 1.001–1.03)
Squamous Epithelial / LPF: NONE SEEN /HPF (ref <=5)
WBC, UA: NONE SEEN /HPF (ref 0–5)
pH: 5.5 (ref 5.0–8.0)

## 2019-04-09 LAB — MICROALBUMIN / CREATININE URINE RATIO
Creatinine, Urine: 48 mg/dL (ref 20–275)
Microalb Creat Ratio: 6 mcg/mg creat (ref <30)
Microalb, Ur: 0.3 mg/dL

## 2019-04-23 ENCOUNTER — Other Ambulatory Visit: Payer: Self-pay

## 2019-04-23 DIAGNOSIS — Z79899 Other long term (current) drug therapy: Secondary | ICD-10-CM

## 2019-04-23 DIAGNOSIS — Z1212 Encounter for screening for malignant neoplasm of rectum: Secondary | ICD-10-CM

## 2019-04-23 DIAGNOSIS — Z1211 Encounter for screening for malignant neoplasm of colon: Secondary | ICD-10-CM

## 2019-04-23 DIAGNOSIS — R0989 Other specified symptoms and signs involving the circulatory and respiratory systems: Secondary | ICD-10-CM

## 2019-04-23 LAB — POC HEMOCCULT BLD/STL (HOME/3-CARD/SCREEN)
Card #2 Fecal Occult Blod, POC: NEGATIVE
Card #3 Fecal Occult Blood, POC: NEGATIVE
Fecal Occult Blood, POC: NEGATIVE

## 2019-04-26 DIAGNOSIS — Z1211 Encounter for screening for malignant neoplasm of colon: Secondary | ICD-10-CM

## 2019-04-26 DIAGNOSIS — Z1212 Encounter for screening for malignant neoplasm of rectum: Secondary | ICD-10-CM

## 2019-05-21 ENCOUNTER — Other Ambulatory Visit: Payer: Self-pay | Admitting: Internal Medicine

## 2019-05-21 DIAGNOSIS — F325 Major depressive disorder, single episode, in full remission: Secondary | ICD-10-CM

## 2019-06-02 ENCOUNTER — Other Ambulatory Visit: Payer: Self-pay

## 2019-06-02 DIAGNOSIS — G4700 Insomnia, unspecified: Secondary | ICD-10-CM

## 2019-06-02 NOTE — Telephone Encounter (Signed)
Refill request for Alprazolam. In que, for your review.  

## 2019-06-03 MED ORDER — ALPRAZOLAM 1 MG PO TABS: ORAL_TABLET | ORAL | 0 refills | Status: DC

## 2019-06-17 ENCOUNTER — Other Ambulatory Visit: Payer: Self-pay | Admitting: Internal Medicine

## 2019-06-17 DIAGNOSIS — N3010 Interstitial cystitis (chronic) without hematuria: Secondary | ICD-10-CM

## 2019-07-05 NOTE — Progress Notes (Deleted)
MEDICARE ANNUAL WELLNESS VISIT AND FOLLOW UP  Assessment:    Diagnoses and all orders for this visit:  Encounter for Medicare annual wellness exam  Aortic atherosclerosis ([place]) Control blood pressure, cholesterol, glucose, increase exercise.   Pulmonary emphysema, unspecified emphysema type ([place]) By imaging, monitor, asymptomatic   Former smoker Get CXR annually; UTD  Essential hypertension Continue medications Monitor blood pressure at home; call if consistently over 130/80 Continue DASH diet.   Reminder to go to the ER if any CP, SOB, nausea, dizziness, severe HA, changes vision/speech, left arm numbness and tingling and jaw pain.  Gastroesophageal reflux disease without esophagitis Well managed on current medications Discussed diet, avoiding triggers and other lifestyle changes  Osteopenia, unspecified location Repeat DEXA 2021, Emphasized high impact/weight bearing exercise, calcium, vitamin D  Vitamin D deficiency Has increased dose Continue supplementation; discussed goal of 60-100 Check vitamin D level   Major depression in remission ([place]) Improved with lexapro; uses xanax for night time panic attacks and insomnia Lifestyle discussed: diet/exerise, sleep hygiene, stress management, hydration  Insomnia, unspecified type  Reminded to limit use of benzo, <5 days/week good sleep hygiene discussed, increase day time activity  HYPERCHOLESTEROLEMIA On rosuvastatin 20 mg daily and tolerating well  LDL goal <100 Encouraged low cholesterol diet and exercise.  Check lipid panel.   Abnormal glucose Discussed diet/exercise, weight management  Check A1C q15m; check CMP  No orders of the defined types were placed in this encounter.   Over 40 minutes of exam, counseling, chart review and critical decision making was performed Future Appointments  Date Time Provider [place]  07/07/2019 10:30 AM Liane Comber, NP [facility] None  10/07/2019 11:30 AM  Unk Pinto, MD [facility] None  04/26/2020 11:00 AM Unk Pinto, MD [facility] None     Plan:   During the course of the visit the patient was educated and counseled about appropriate screening and preventive services including:    Pneumococcal vaccine   Prevnar 13  Influenza vaccine  Td vaccine  Screening electrocardiogram  Bone densitometry screening  Colorectal cancer screening  Diabetes screening  Glaucoma screening  Nutrition counseling   Advanced directives: requested   Subjective:  Samantha [name] is a 74 y.o. female who presents for Medicare Annual Wellness Visit and 6 month follow up.   She has remote history of breast cancer without recurrence, was released by Dr. Jana Hakim and getting annual mammograms.   She is on lexapro for depression; feels that she is doing well on this agent. Previously on celexa. She uses the xanax at night for insomnia, not during the day. Taking at night when she has most of her panic attacks, takes 1/2-1 depending on how bad she is doing, trying to limit.  She has ICS and takes elavil PRN, worse when stressed, sleeping away from home.   BMI is There is no height or weight on file to calculate BMI., she has not been working on diet and exercise. Wt Readings from Last 3 Encounters:  04/07/19 116 lb 6.4 oz (52.8 kg)  01/26/19 118 lb (53.5 kg)  10/06/18 117 lb 3.2 oz (53.2 kg)   Today their BP is   She does workout. She denies chest pain, shortness of breath, dizziness.   She is on cholesterol medication (was prescribed rosuvastatin 20 mg daily) and denies myalgias. Her cholesterol is at goal. The cholesterol last visit was:   Lab Results  Component Value Date   CHOL 118 04/07/2019   HDL 38 (L) 04/07/2019  LDLCALC 61 04/07/2019   TRIG 102 04/07/2019   CHOLHDL 3.1 04/07/2019    She has been working on diet and exercise for glucose management, and denies foot ulcerations, increased appetite, nausea, paresthesia of  the feet, polydipsia, polyuria, visual disturbances, vomiting and weight loss. Last A1C in the office was:  Lab Results  Component Value Date   HGBA1C 5.7 (H) 04/07/2019   Last GFR: Lab Results  Component Value Date   GFRNONAA 91 04/07/2019   Patient is on Vitamin D supplement, taking 3000 IU Lab Results  Component Value Date   VD25OH 18 04/07/2019      Medication Review: Current Outpatient Medications on File Prior to Visit  Medication Sig Dispense Refill  . ALPRAZolam (XANAX) 1 MG tablet TAKE 1/2 TO 1 TABLET 1 TO 2X /DAY FOR ANXIETY OR SLEEP & PLEASE TRY TO LIMIT TO 5 DAYS /WEEK TO AVOID ADDICTION - SHOULD LAST LONGER THAN 30 DAYS 30 tablet 0  . amitriptyline (ELAVIL) 10 MG tablet TAKE 1 TO 2 TABLETS AT BEDTIME AS DIRECTED FOR SLEEP 180 tablet 3  . cholecalciferol (VITAMIN D) 1000 units tablet Take 1,000 Units by mouth 3 (three) times daily.     Marland Kitchen escitalopram (LEXAPRO) 20 MG tablet TAKE 1 TABLET BY MOUTH DAILY FOR MOOD 90 tablet 3  . Magnesium 250 MG TABS Take 250 mg by mouth daily.    . Multiple Vitamin (MULTIVITAMIN) tablet Take 1 tablet by mouth daily.    . rosuvastatin (CRESTOR) 20 MG tablet Take 1 tablet Daily for Cholesterol 90 tablet 3  . vitamin C (ASCORBIC ACID) 500 MG tablet Take 500 mg by mouth 2 (two) times daily.      No current facility-administered medications on file prior to visit.    Allergies  Allergen Reactions  . Codeine   . Decongestant [Pseudoephedrine Hcl]     Current Problems (verified) Patient Active Problem List   Diagnosis Date Noted  . Aortic atherosclerosis ([facility]) 09/23/2017  . Emphysema of lung ([place]) 09/23/2017  . Encounter for Medicare annual wellness exam 10/24/2014  . Osteopenia 09/08/2014  . Abnormal glucose 05/04/2014  . Vitamin D deficiency 05/04/2014  . Medication management 05/04/2014  . Esophageal reflux 01/31/2011  . HYPERCHOLESTEROLEMIA 08/15/2009  . Major depression in remission ([place]) 08/15/2009  . Essential hypertension  08/15/2009  . Insomnia 08/15/2009    Screening Tests  There is no immunization history on file for this patient.   Health Maintenance:   Last colonoscopy: 10/2012 Last mammogram 3D: 12/2018 Last pap smear/pelvic exam: 2011 remote, declines another DEXA: 11/2017 - T fem -1.7 *** Ct chest 2009 CXR 11/2017 US soft tissue head 09/2014  Prior vaccinations: TD or Tdap: 1999 and declines Influenza: declines Pneumococcal: 2000 declines Prevnar 13 declines Shingles/Zostavax: declines   Names of Other Physician/Practitioners you currently use: 1. [facility]- here for primary care 2. [facility], Dr. Herschel Sen[place], eye doctor, last visit 2019, has left cataract, monitoring only 3. Dr. Fara Boros, family dentistry, last visit 2019, goes q92m  Patient Care Team: Unk Pinto, MD as PCP - General (Internal Medicine) Inda Castle, MD (Inactive) as Consulting Physician (Gastroenterology) Crista Luria, MD as Consulting Physician (Dermatology) Magrinat, Virgie Dad, MD as Consulting Physician (Oncology)  SURGICAL HISTORY She  has a past surgical history that includes Abdominal hysterectomy; Cholecystectomy (2004); Nasal sinus surgery; and Breast lumpectomy (2005). FAMILY HISTORY Her family history includes Bipolar disorder in her mother; Breast cancer in her maternal aunt, maternal aunt, maternal aunt, maternal  aunt, and mother; Heart disease in her mother; Heart disease (age of onset: 20) in her father; Parkinson's disease in her sister. SOCIAL HISTORY She  reports that she quit smoking about 7 years ago. She has a 14.00 pack-year smoking history. She has never used smokeless tobacco. She reports current alcohol use of about 1.0 standard drinks of alcohol per week. She reports that she does not use drugs.   MEDICARE WELLNESS OBJECTIVES: Physical activity:   Cardiac risk factors:   Depression/mood screen:   Depression screen [facility] 2/9 04/07/2019   Decreased Interest 0  Down, Depressed, Hopeless 0  PHQ - 2 Score 0  Altered sleeping -  Tired, decreased energy -  Change in appetite -  Feeling bad or failure about yourself  -  Trouble concentrating -  Moving slowly or fidgety/restless -  Suicidal thoughts -  PHQ-9 Score -  Difficult doing work/chores -    ADLs:  In your present state of health, do you have any difficulty performing the following activities: 04/07/2019 10/06/2018  Hearing? N N  Vision? N N  Difficulty concentrating or making decisions? N N  Walking or climbing stairs? N N  Dressing or bathing? N N  Doing errands, shopping? N N  Some recent data might be hidden     Cognitive Testing  Alert? Yes  Normal Appearance?Yes  Oriented to person? Yes  Place? Yes   Time? Yes  Recall of three objects?  Yes  Can perform simple calculations? Yes  Displays appropriate judgment?Yes  Can read the correct time from a watch face?Yes  EOL planning:    Review of Systems  Constitutional: Negative for chills, fever, malaise/fatigue and weight loss.  HENT: Negative for hearing loss and tinnitus.   Eyes: Negative for blurred vision and double vision.  Respiratory: Negative for cough, sputum production, shortness of breath and wheezing.   Cardiovascular: Negative for chest pain, palpitations, orthopnea, claudication, leg swelling and PND.  Gastrointestinal: Negative for abdominal pain, blood in stool, constipation, diarrhea, heartburn, melena, nausea and vomiting.  Genitourinary: Negative.   Musculoskeletal: Negative for falls, joint pain and myalgias.  Skin: Negative for rash.  Neurological: Negative for dizziness, tingling, sensory change, weakness and headaches.  Endo/Heme/Allergies: Negative for polydipsia.  Psychiatric/Behavioral: Negative for depression, memory loss, substance abuse and suicidal ideas. The patient is nervous/anxious and has insomnia.   All other systems reviewed and are negative.    Objective:      There were no vitals filed for this visit. There is no height or weight on file to calculate BMI.  General appearance: alert, no distress, WD/WN, female HEENT: normocephalic, sclerae anicteric, TMs pearly, nares patent, no discharge or erythema, pharynx normal Oral cavity: MMM, no lesions Neck: supple, no lymphadenopathy, no thyromegaly, no masses Heart: RRR, normal S1, S2, no murmurs Lungs: CTA bilaterally, no wheezes, rhonchi, or rales Abdomen: +bs, soft, non tender, non distended, no masses, no hepatomegaly, no splenomegaly Musculoskeletal: nontender, no swelling, no obvious deformity Extremities: no edema, no cyanosis, no clubbing Pulses: 2+ symmetric, upper and lower extremities, normal cap refill Neurological: alert, oriented x 3, CN2-12 intact, strength normal upper extremities and lower extremities, sensation normal throughout, DTRs 2+ throughout, no cerebellar signs, gait normal Psychiatric: normal affect, behavior normal, pleasant  Breasts: Breasts: breasts appear normal, no suspicious masses, no skin or nipple changes or axillary nodes.   Medicare Attestation I have personally reviewed: The patient's medical and social history Their use of alcohol, tobacco or illicit drugs Their current medications and  supplements The patient's functional ability including ADLs,fall risks, home safety risks, cognitive, and hearing and visual impairment Diet and physical activities Evidence for depression or mood disorders  The patient's weight, height, BMI, and visual acuity have been recorded in the chart.  I have made referrals, counseling, and provided education to the patient based on review of the above and I have provided the patient with a written personalized care plan for preventive services.     Izora Ribas, NP   07/05/2019

## 2019-07-07 ENCOUNTER — Ambulatory Visit: Payer: Medicare Other | Admitting: Adult Health

## 2019-07-12 ENCOUNTER — Ambulatory Visit: Payer: Medicare Other | Admitting: Adult Health

## 2019-08-04 NOTE — Progress Notes (Signed)
MEDICARE ANNUAL WELLNESS VISIT AND FOLLOW UP  Assessment:    Diagnoses and all orders for this visit:  Encounter for Medicare annual wellness exam  Aortic atherosclerosis ([place]) Per CXR 2019 Control blood pressure, cholesterol, glucose, increase exercise.   Pulmonary emphysema, unspecified emphysema type ([place]) By imaging, monitor, asymptomatic   Former smoker Get CXR annually; ordered  Essential hypertension Continue medications Monitor blood pressure at home; call if consistently over 130/80 Continue DASH diet.   Reminder to go to the ER if any CP, SOB, nausea, dizziness, severe HA, changes vision/speech, left arm numbness and tingling and jaw pain.  Gastroesophageal reflux disease without esophagitis Well managed on current medications Discussed diet, avoiding triggers and other lifestyle changes  Osteopenia, unspecified location Repeat DEXA 11/2019, Emphasized high impact/weight bearing exercise, calcium, vitamin D   Vitamin D deficiency Has increased dose Continue supplementation; discussed goal of 60-100 Check vitamin D level   Major depression in remission ([place]) Improved with lexapro; uses xanax for night time panic attacks and insomnia Lifestyle discussed: diet/exerise, sleep hygiene, stress management, hydration  Insomnia, unspecified type  Reminded to limit use of benzo, <5 days/week good sleep hygiene discussed, increase day time activity  HYPERCHOLESTEROLEMIA On rosuvastatin 20 mg daily and tolerating well  LDL goal <100 Encouraged low cholesterol diet and exercise.  Check lipid panel.   Abnormal glucose Discussed diet/exercise, weight management  Check A1C q53m; check CMP  Exertional dyspnea X 2 months, No problems with flat surfaces, no concerning accompaniments EKG normal, pending CXR Given anoro sample, 1 puff daily x 1 week, follow up with progress, may add guaifenacin for AM secretions  ? conincides with allergy season, not taking  antihistamine, restart Follow up with progress in 2-4 weeks  Orders Placed This Encounter  Procedures  . DG Chest 2 View  . CBC with Differential/Platelet  . COMPLETE METABOLIC PANEL WITH GFR  . Magnesium  . Lipid panel  . TSH  . Hemoglobin A1c  . EKG 12-Lead    Over 40 minutes of exam, counseling, chart review and critical decision making was performed Future Appointments  Date Time Provider [place]  11/08/2019  9:30 AM Unk Pinto, MD [facility] None  04/26/2020 11:00 AM Unk Pinto, MD [facility] None  08/07/2020  3:00 PM Liane Comber, NP [facility] None     Plan:   During the course of the visit the patient was educated and counseled about appropriate screening and preventive services including:    Pneumococcal vaccine   Prevnar 13  Influenza vaccine  Td vaccine  Screening electrocardiogram  Bone densitometry screening  Colorectal cancer screening  Diabetes screening  Glaucoma screening  Nutrition counseling   Advanced directives: requested   Subjective:  Samantha [name] is a 74 y.o. female who presents for Medicare Annual Wellness Visit and 6 month follow up.   She has remote history of breast cancer without recurrence, was released by Dr. Jana Hakim and getting annual mammograms.   She is on lexapro for depression; feels that she is doing well on this agent. Previously on celexa.She uses the xanax at night for insomnia, not during the day. Taking at night when she has most of her panic attacks, takes 1/2-1 depending on how bad she is doing, trying to limit.   She has ICS and takes elavil PRN, worse when stressed, sleeping away from home.   She reports new onset exertional dyspnea x 2 months, not with flat surfaces, just with lifting or going up hills. Reports recently can get  out of breath just making her bed or vacuuming. She does report still able to walk 45 min on flat surface, but struggles with going up hill. Denies CP,  dizziness, LE edema, PND, wheezing. She does report increased cough and congestion in the morning, congestion that clears after 10 min or so. She does endorse mild seasonal allergies, does note a cough and tightness when she goes outdoors. Admits hasn't been taking allegra regular.   BMI is Body mass index is 22.3 kg/m., she has not been working on diet and exercise. She does exercise 2-3 days a week, reports walks ~45 min and may do some weights.  Wt Readings from Last 3 Encounters:  08/05/19 118 lb (53.5 kg)  04/07/19 116 lb 6.4 oz (52.8 kg)  01/26/19 118 lb (53.5 kg)   Today their BP is BP: 114/68 She does workout. She denies chest pain, dizziness.   She is on cholesterol medication (rosuvastatin 20 mg daily) and denies myalgias. Her cholesterol is at goal. The cholesterol last visit was:   Lab Results  Component Value Date   CHOL 118 04/07/2019   HDL 38 (L) 04/07/2019   LDLCALC 61 04/07/2019   TRIG 102 04/07/2019   CHOLHDL 3.1 04/07/2019    She has been working on diet and exercise for glucose management, and denies foot ulcerations, increased appetite, nausea, paresthesia of the feet, polydipsia, polyuria, visual disturbances, vomiting and weight loss. Last A1C in the office was:  Lab Results  Component Value Date   HGBA1C 5.7 (H) 04/07/2019   Last GFR: Lab Results  Component Value Date   GFRNONAA 91 04/07/2019   Patient is on Vitamin D supplement, taking 3000 IU Lab Results  Component Value Date   VD25OH 85 04/07/2019      Medication Review: Current Outpatient Medications on File Prior to Visit  Medication Sig Dispense Refill  . amitriptyline (ELAVIL) 10 MG tablet TAKE 1 TO 2 TABLETS AT BEDTIME AS DIRECTED FOR SLEEP 180 tablet 3  . cholecalciferol (VITAMIN D) 1000 units tablet Take 1,000 Units by mouth 3 (three) times daily.     Marland Kitchen escitalopram (LEXAPRO) 20 MG tablet TAKE 1 TABLET BY MOUTH DAILY FOR MOOD 90 tablet 3  . Magnesium 250 MG TABS Take 250 mg by mouth daily.     . Multiple Vitamin (MULTIVITAMIN) tablet Take 1 tablet by mouth daily.    . rosuvastatin (CRESTOR) 20 MG tablet Take 1 tablet Daily for Cholesterol 90 tablet 3  . vitamin C (ASCORBIC ACID) 500 MG tablet Take 500 mg by mouth 2 (two) times daily.      No current facility-administered medications on file prior to visit.    Allergies  Allergen Reactions  . Codeine   . Decongestant [Pseudoephedrine Hcl]     Current Problems (verified) Patient Active Problem List   Diagnosis Date Noted  . Exertional dyspnea 08/05/2019  . Aortic atherosclerosis ([place]) 09/23/2017  . Emphysema of lung ([place]) 09/23/2017  . Encounter for Medicare annual wellness exam 10/24/2014  . Osteopenia 09/08/2014  . Abnormal glucose 05/04/2014  . Vitamin D deficiency 05/04/2014  . Medication management 05/04/2014  . Esophageal reflux 01/31/2011  . HYPERCHOLESTEROLEMIA 08/15/2009  . Major depression in remission ([place]) 08/15/2009  . Essential hypertension 08/15/2009  . Insomnia 08/15/2009    Screening Tests  There is no immunization history on file for this patient.   Health Maintenance:   Last colonoscopy: 10/2012 Last mammogram 3D: 12/2018 Last pap smear/pelvic exam: 2011 remote, declines another DEXA: 11/2017 -  T fem -1.7 declines this year, get 2022 Ct chest 2009  CXR 11/2017 former light intermittent smoker 1-2 pack/week, estimated 14 pack year history,  US soft tissue head 09/2014  Prior vaccinations: TD or Tdap: 1999 and declines Influenza: declines Pneumococcal: 2000 declines Prevnar 13 declines Shingles/Zostavax: declines  Covid 19: declines   Names of Other Physician/Practitioners you currently use: 1. [facility]- here for primary care 2. [facility], Dr. Herschel [place], eye doctor, last visit 2019, has left cataract, monitoring only, plans to schedule this year 3. Dr. Fara Boros, [facility] on [address], last visit 2021, goes q62m  Patient Care  Team: Unk Pinto, MD as PCP - General (Internal Medicine) Inda Castle, MD (Inactive) as Consulting Physician (Gastroenterology) Crista Luria, MD as Consulting Physician (Dermatology) Magrinat, Virgie Dad, MD as Consulting Physician (Oncology)  SURGICAL HISTORY She  has a past surgical history that includes Abdominal hysterectomy; Cholecystectomy (2004); Nasal sinus surgery; and Breast lumpectomy (2005). FAMILY HISTORY Her family history includes Bipolar disorder in her mother; Breast cancer in her maternal aunt, maternal aunt, maternal aunt, maternal aunt, and mother; Heart disease in her mother; Heart disease (age of onset: 72) in her father; Parkinson's disease in her sister. SOCIAL HISTORY She  reports that she quit smoking about 7 years ago. She has a 14.00 pack-year smoking history. She has never used smokeless tobacco. She reports current alcohol use of about 1.0 standard drinks of alcohol per week. She reports that she does not use drugs.   MEDICARE WELLNESS OBJECTIVES: Physical activity: Current Exercise Habits: Home exercise routine, Type of exercise: walking;strength training/weights, Time (Minutes): 60, Frequency (Times/Week): 3, Weekly Exercise (Minutes/Week): 180, Intensity: Mild, Exercise limited by: None identified Cardiac risk factors: Cardiac Risk Factors include: advanced age (>15men, >10 women);dyslipidemia;hypertension;smoking/ tobacco exposure Depression/mood screen:   Depression screen [facility] 2/9 08/05/2019  Decreased Interest 1  Down, Depressed, Hopeless 1  PHQ - 2 Score 2  Altered sleeping 0  Tired, decreased energy 0  Change in appetite 0  Feeling bad or failure about yourself  0  Trouble concentrating 0  Moving slowly or fidgety/restless 0  Suicidal thoughts 0  PHQ-9 Score 2  Difficult doing work/chores Not difficult at all    ADLs:  In your present state of health, do you have any difficulty performing the following activities: 08/05/2019 04/07/2019   Hearing? N N  Vision? N N  Difficulty concentrating or making decisions? N N  Walking or climbing stairs? N N  Dressing or bathing? N N  Doing errands, shopping? N N  Some recent data might be hidden     Cognitive Testing  Alert? Yes  Normal Appearance?Yes  Oriented to person? Yes  Place? Yes   Time? Yes  Recall of three objects?  Yes  Can perform simple calculations? Yes  Displays appropriate judgment?Yes  Can read the correct time from a watch face?Yes  EOL planning: Does Patient Have a Medical Advance Directive?: Yes Type of Advance Directive: Healthcare Power of Attorney, Living will Does patient want to make changes to medical advance directive?: No - Patient declined Copy of [place] in Chart?: No - copy requested  Review of Systems  Constitutional: Negative for chills, fever, malaise/fatigue and weight loss.  HENT: Negative for hearing loss and tinnitus.   Eyes: Negative for blurred vision and double vision.  Respiratory: Negative for cough, sputum production, shortness of breath and wheezing.   Cardiovascular: Negative for chest pain, palpitations, orthopnea, claudication, leg swelling and  PND.  Gastrointestinal: Negative for abdominal pain, blood in stool, constipation, diarrhea, heartburn, melena, nausea and vomiting.  Genitourinary: Negative.   Musculoskeletal: Negative for falls, joint pain and myalgias.  Skin: Negative for rash.  Neurological: Negative for dizziness, tingling, sensory change, weakness and headaches.  Endo/Heme/Allergies: Negative for polydipsia.  Psychiatric/Behavioral: Negative for depression, memory loss, substance abuse and suicidal ideas. The patient is nervous/anxious and has insomnia.   All other systems reviewed and are negative.    Objective:     Today's Vitals   08/05/19 1501  BP: 114/68  Pulse: 94  Temp: (!) 97.2 F (36.2 C)  SpO2: 98%  Weight: 118 lb (53.5 kg)   Body mass index is 22.3 kg/m.  General  appearance: alert, no distress, WD/WN, female HEENT: normocephalic, sclerae anicteric, TMs pearly, nares patent, no discharge or erythema, pharynx normal Oral cavity: MMM, no lesions Neck: supple, no lymphadenopathy, no thyromegaly, no masses Heart: RRR, normal S1, S2, no murmurs Lungs: CTA bilaterally, no wheezes, rhonchi, or rales Abdomen: +bs, soft, non tender, non distended, no masses, no hepatomegaly, no splenomegaly Musculoskeletal: nontender, no swelling, no obvious deformity Extremities: no edema, no cyanosis, no clubbing Pulses: 2+ symmetric, upper and lower extremities, normal cap refill Neurological: alert, oriented x 3, CN2-12 intact, strength normal upper extremities and lower extremities, sensation normal throughout, DTRs 2+ throughout, no cerebellar signs, gait normal Psychiatric: normal affect, behavior normal, pleasant  Breasts: Breasts: breasts appear normal, no suspicious masses, no skin or nipple changes or axillary nodes.   Medicare Attestation I have personally reviewed: The patient's medical and social history Their use of alcohol, tobacco or illicit drugs Their current medications and supplements The patient's functional ability including ADLs,fall risks, home safety risks, cognitive, and hearing and visual impairment Diet and physical activities Evidence for depression or mood disorders  The patient's weight, height, BMI, and visual acuity have been recorded in the chart.  I have made referrals, counseling, and provided education to the patient based on review of the above and I have provided the patient with a written personalized care plan for preventive services.     Izora Ribas, NP   08/05/2019

## 2019-08-05 ENCOUNTER — Ambulatory Visit (INDEPENDENT_AMBULATORY_CARE_PROVIDER_SITE_OTHER): Payer: Medicare Other | Admitting: Adult Health

## 2019-08-05 ENCOUNTER — Other Ambulatory Visit: Payer: Self-pay

## 2019-08-05 ENCOUNTER — Encounter: Payer: Self-pay | Admitting: Adult Health

## 2019-08-05 VITALS — BP 114/68 | HR 94 | Temp 97.2°F | Wt 118.0 lb

## 2019-08-05 DIAGNOSIS — F325 Major depressive disorder, single episode, in full remission: Secondary | ICD-10-CM

## 2019-08-05 DIAGNOSIS — R0609 Other forms of dyspnea: Secondary | ICD-10-CM

## 2019-08-05 DIAGNOSIS — I10 Essential (primary) hypertension: Secondary | ICD-10-CM

## 2019-08-05 DIAGNOSIS — R7309 Other abnormal glucose: Secondary | ICD-10-CM

## 2019-08-05 DIAGNOSIS — J439 Emphysema, unspecified: Secondary | ICD-10-CM

## 2019-08-05 DIAGNOSIS — E559 Vitamin D deficiency, unspecified: Secondary | ICD-10-CM

## 2019-08-05 DIAGNOSIS — R6889 Other general symptoms and signs: Secondary | ICD-10-CM | POA: Diagnosis not present

## 2019-08-05 DIAGNOSIS — Z0001 Encounter for general adult medical examination with abnormal findings: Secondary | ICD-10-CM

## 2019-08-05 DIAGNOSIS — K219 Gastro-esophageal reflux disease without esophagitis: Secondary | ICD-10-CM

## 2019-08-05 DIAGNOSIS — M8580 Other specified disorders of bone density and structure, unspecified site: Secondary | ICD-10-CM

## 2019-08-05 DIAGNOSIS — R0600 Dyspnea, unspecified: Secondary | ICD-10-CM

## 2019-08-05 DIAGNOSIS — Z0000 Encounter for general adult medical examination without abnormal findings: Secondary | ICD-10-CM

## 2019-08-05 DIAGNOSIS — G4700 Insomnia, unspecified: Secondary | ICD-10-CM

## 2019-08-05 DIAGNOSIS — Z6822 Body mass index (BMI) 22.0-22.9, adult: Secondary | ICD-10-CM

## 2019-08-05 DIAGNOSIS — E7800 Pure hypercholesterolemia, unspecified: Secondary | ICD-10-CM

## 2019-08-05 DIAGNOSIS — Z79899 Other long term (current) drug therapy: Secondary | ICD-10-CM

## 2019-08-05 DIAGNOSIS — Z87891 Personal history of nicotine dependence: Secondary | ICD-10-CM

## 2019-08-05 DIAGNOSIS — I700 Atherosclerosis of aorta: Secondary | ICD-10-CM

## 2019-08-05 MED ORDER — ALPRAZOLAM 1 MG PO TABS: ORAL_TABLET | ORAL | 0 refills | Status: DC

## 2019-08-05 NOTE — Patient Instructions (Addendum)
Samantha [name] , Thank you for taking time to come for your Medicare Wellness Visit. I appreciate your ongoing commitment to your health goals. Please review the following plan we discussed and let me know if I can assist you in the future.   These are the goals we discussed: Goals    . Exercise 150 min/wk Moderate Activity    . LDL CALC < 100       This is a list of the screening recommended for you and due dates:  Health Maintenance  Topic Date Due  . COVID-19 Vaccine (1) Never done  . Tetanus Vaccine  10/06/2019*  . Pneumonia vaccines (1 of 2 - PCV13) 10/06/2019*  . Flu Shot  11/07/2019  . Mammogram  12/30/2020  . Colon Cancer Screening  11/05/2022  . DEXA scan (bone density measurement)  Completed  .  Hepatitis C: One time screening is recommended by Center for Disease Control  (CDC) for  adults born from [birth year] through 1964.   Completed  *Topic was postponed. The date shown is not the original due date.     Please schedule follow up with eye doctor   Please get chest xray at [facility] - walk in and give name  Please get on daily allergy medication, and can try anoro 1 puff daily for 1 week - call back if this helps exertional shortness of breath  Can also try guaifenacin/mucinex at night can help lung secretions in the morning         Shortness of Breath, Adult Shortness of breath is when a person has trouble breathing enough air or when a person feels like she or he is having trouble breathing in enough air. Shortness of breath could be a sign of a medical problem. Follow these instructions at home:   Pay attention to any changes in your symptoms.  Do not use any products that contain nicotine or tobacco, such as cigarettes, e-cigarettes, and chewing tobacco.  Do not smoke. Smoking is a common cause of shortness of breath. If you need help quitting, ask your health care provider.  Avoid things that can irritate your airways, such  as: ? Mold. ? Dust. ? Air pollution. ? Chemical fumes. ? Things that can cause allergy symptoms (allergens), if you have allergies.  Keep your living space clean and free of mold and dust.  Rest as needed. Slowly return to your usual activities.  Take over-the-counter and prescription medicines only as told by your health care provider. This includes oxygen therapy and inhaled medicines.  Keep all follow-up visits as told by your health care provider. This is important. Contact a health care provider if:  Your condition does not improve as soon as expected.  You have a hard time doing your normal activities, even after you rest.  You have new symptoms. Get help right away if:  Your shortness of breath gets worse.  You have shortness of breath when you are resting.  You feel light-headed or you faint.  You have a cough that is not controlled with medicines.  You cough up blood.  You have pain with breathing.  You have pain in your chest, arms, shoulders, or abdomen.  You have a fever.  You cannot walk up stairs or exercise the way that you normally do. These symptoms may represent a serious problem that is an emergency. Do not wait to see if the symptoms will go away. Get medical help right away. Call  your local emergency services (911 in the [place] Do not drive yourself to the hospital. Summary  Shortness of breath is when a person has trouble breathing enough air. It can be a sign of a medical problem.  Avoid things that irritate your lungs, such as smoking, pollution, mold, and dust.  Pay attention to changes in your symptoms and contact your health care provider if you have a hard time completing daily activities because of shortness of breath. This information is not intended to replace advice given to you by your health care provider. Make sure you discuss any questions you have with your health care provider. Document Revised: 08/25/2017 Document Reviewed:  08/25/2017 Elsevier Patient Education  [place].

## 2019-08-06 LAB — COMPLETE METABOLIC PANEL WITHOUT GFR
AG Ratio: 2.2 (calc) (ref 1.0–2.5)
ALT: 17 U/L (ref 6–29)
AST: 18 U/L (ref 10–35)
Albumin: 4.8 g/dL (ref 3.6–5.1)
Alkaline phosphatase (APISO): 87 U/L (ref 37–153)
BUN: 12 mg/dL (ref 7–25)
CO2: 30 mmol/L (ref 20–32)
Calcium: 9.7 mg/dL (ref 8.6–10.4)
Chloride: 103 mmol/L (ref 98–110)
Creat: 0.72 mg/dL (ref 0.60–0.93)
GFR, Est African American: 97 mL/min/1.73m2
GFR, Est Non African American: 84 mL/min/1.73m2
Globulin: 2.2 g/dL (ref 1.9–3.7)
Glucose, Bld: 90 mg/dL (ref 65–99)
Potassium: 3.8 mmol/L (ref 3.5–5.3)
Sodium: 140 mmol/L (ref 135–146)
Total Bilirubin: 0.4 mg/dL (ref 0.2–1.2)
Total Protein: 7 g/dL (ref 6.1–8.1)

## 2019-08-06 LAB — LIPID PANEL
Cholesterol: 150 mg/dL
HDL: 45 mg/dL — ABNORMAL LOW
LDL Cholesterol (Calc): 78 mg/dL
Non-HDL Cholesterol (Calc): 105 mg/dL
Total CHOL/HDL Ratio: 3.3 (calc)
Triglycerides: 177 mg/dL — ABNORMAL HIGH

## 2019-08-06 LAB — HEMOGLOBIN A1C
Hgb A1c MFr Bld: 5.7 %{Hb} — ABNORMAL HIGH
Mean Plasma Glucose: 117 (calc)
eAG (mmol/L): 6.5 (calc)

## 2019-08-06 LAB — CBC WITH DIFFERENTIAL/PLATELET
Absolute Monocytes: 468 cells/uL (ref 200–950)
Basophils Absolute: 42 cells/uL (ref 0–200)
Basophils Relative: 0.7 %
Eosinophils Absolute: 48 cells/uL (ref 15–500)
Eosinophils Relative: 0.8 %
HCT: 39.1 % (ref 35.0–45.0)
Hemoglobin: 13.1 g/dL (ref 11.7–15.5)
Lymphs Abs: 2124 cells/uL (ref 850–3900)
MCH: 31.7 pg (ref 27.0–33.0)
MCHC: 33.5 g/dL (ref 32.0–36.0)
MCV: 94.7 fL (ref 80.0–100.0)
MPV: 9.5 fL (ref 7.5–12.5)
Monocytes Relative: 7.8 %
Neutro Abs: 3318 cells/uL (ref 1500–7800)
Neutrophils Relative %: 55.3 %
Platelets: 324 10*3/uL (ref 140–400)
RBC: 4.13 10*6/uL (ref 3.80–5.10)
RDW: 12.2 % (ref 11.0–15.0)
Total Lymphocyte: 35.4 %
WBC: 6 10*3/uL (ref 3.8–10.8)

## 2019-08-06 LAB — MAGNESIUM: Magnesium: 2.3 mg/dL (ref 1.5–2.5)

## 2019-08-06 LAB — TSH: TSH: 1.76 m[IU]/L (ref 0.40–4.50)

## 2019-08-12 ENCOUNTER — Ambulatory Visit
Admission: RE | Admit: 2019-08-12 | Discharge: 2019-08-12 | Disposition: A | Discharge status: Home / Self Care | Payer: Medicare Other | Source: Ambulatory Visit | Attending: Adult Health | Admitting: Adult Health

## 2019-08-12 DIAGNOSIS — Z87891 Personal history of nicotine dependence: Secondary | ICD-10-CM

## 2019-08-12 DIAGNOSIS — R0609 Other forms of dyspnea: Secondary | ICD-10-CM

## 2019-10-05 ENCOUNTER — Other Ambulatory Visit: Payer: Self-pay | Admitting: Adult Health

## 2019-10-05 DIAGNOSIS — G4700 Insomnia, unspecified: Secondary | ICD-10-CM

## 2019-10-07 ENCOUNTER — Ambulatory Visit: Payer: Medicare Other | Admitting: Internal Medicine

## 2019-10-12 ENCOUNTER — Ambulatory Visit: Payer: Medicare Other | Admitting: Adult Health

## 2019-11-08 ENCOUNTER — Ambulatory Visit: Payer: Medicare Other | Admitting: Internal Medicine

## 2019-11-23 ENCOUNTER — Other Ambulatory Visit: Payer: Self-pay | Admitting: Adult Health

## 2019-11-23 DIAGNOSIS — G4700 Insomnia, unspecified: Secondary | ICD-10-CM

## 2019-11-29 ENCOUNTER — Ambulatory Visit: Payer: Medicare Other | Admitting: Internal Medicine

## 2019-11-30 ENCOUNTER — Encounter: Payer: Self-pay | Admitting: Internal Medicine

## 2019-11-30 ENCOUNTER — Other Ambulatory Visit: Payer: Self-pay | Admitting: Adult Health

## 2019-11-30 DIAGNOSIS — Z1231 Encounter for screening mammogram for malignant neoplasm of breast: Secondary | ICD-10-CM

## 2019-11-30 NOTE — Patient Instructions (Signed)

## 2019-11-30 NOTE — Progress Notes (Signed)
History of Present Illness:       This very nice 74 y.o. MWF presents for 6 month follow up with HTN, HLD, Pre-Diabetes and Vitamin D Deficiency.       Patient is treated for HTN (2000)  & BP has been controlled at home. Today's BP is at goal - 104/58. Patient has had no complaints of any cardiac type chest pain, palpitations, dyspnea / orthopnea / PND, dizziness, claudication, or dependent edema.      Hyperlipidemia is controlled with diet & Rosuvastatin.  Patient denies myalgias or other med SE's. Last Lipids were at goal except slightly elevated Trig's:  Lab Results  Component Value Date   CHOL 150 08/05/2019   HDL 45 (L) 08/05/2019   LDLCALC 78 08/05/2019   TRIG 177 (H) 08/05/2019   CHOLHDL 3.3 08/05/2019    Also, the patient has history of PreDiabetes (A1c 5.7% / 2013)  and has had no symptoms of reactive hypoglycemia, diabetic polys, paresthesias or visual blurring.  Last A1c was near goal:  Lab Results  Component Value Date   HGBA1C 5.7 (H) 08/05/2019       Further, the patient also has history of Vitamin D Deficiency  ("37" / 2019)  and supplements vitamin D without any suspected side-effects. Last vitamin D was at goal:  Lab Results  Component Value Date   VD25OH 44 04/07/2019    Current Outpatient Medications on File Prior to Visit  Medication Sig  . ALPRAZolam (XANAX) 1 MG tablet TAKE 1/2 TO 1 TABLET BY MOUTH 1 TO 2X /DAY FOR ANXIETY OR SLEEP & PLEASE TRY TO LIMIT TO 5 DAYS /WEEK TO AVOID ADDICTION - SHOULD LAST LONGER THAN 30 DAYS  . amitriptyline (ELAVIL) 10 MG tablet TAKE 1 TO 2 TABLETS AT BEDTIME AS DIRECTED FOR SLEEP  . cholecalciferol (VITAMIN D) 1000 units tablet Take 1,000 Units by mouth. Takes 3000 units daily  . escitalopram (LEXAPRO) 20 MG tablet TAKE 1 TABLET BY MOUTH DAILY FOR MOOD  . Magnesium 250 MG TABS Take 250 mg by mouth daily.  . rosuvastatin (CRESTOR) 20 MG tablet Take 1 tablet Daily for Cholesterol  . vitamin C (ASCORBIC ACID) 500 MG  tablet Take 500 mg by mouth 2 (two) times daily.   . Multiple Vitamin (MULTIVITAMIN) tablet Take 1 tablet by mouth daily. (Patient not taking: Reported on 12/01/2019)   No current facility-administered medications on file prior to visit.    Allergies  Allergen Reactions  . Codeine   . Decongestant [Pseudoephedrine Hcl]     PMHx:   Past Medical History:  Diagnosis Date  . Anxiety   . Breast cancer ([place])    left   . Depressive disorder, not elsewhere classified   . Diverticulosis of colon (without mention of hemorrhage) 2007   Colonoscopy  . Esophagitis, unspecified 2001   EGD  . Female genital prolapse 01/26/2019  . GERD (gastroesophageal reflux disease)   . Hx of colonic polyps 2003   Colonoscopy   . IBS (irritable bowel syndrome)   . Insomnia, unspecified   . Internal hemorrhoids without mention of complication 9563   Colonoscopy   . Personal history of radiation therapy 04/08/2004  . Pure hypercholesterolemia   . Stricture and stenosis of esophagus 2001, 2007   EGD  . Unspecified essential hypertension      There is no immunization history on file for this patient.  Past Surgical History:  Procedure Laterality Date  . ABDOMINAL HYSTERECTOMY    .  BREAST LUMPECTOMY  2005   left  . CHOLECYSTECTOMY  2004  . NASAL SINUS SURGERY      FHx:    Reviewed / unchanged  SHx:    Reviewed / unchanged   Systems Review:  Constitutional: Denies fever, chills, wt changes, headaches, insomnia, fatigue, night sweats, change in appetite. Eyes: Denies redness, blurred vision, diplopia, discharge, itchy, watery eyes.  ENT: Denies discharge, congestion, post nasal drip, epistaxis, sore throat, earache, hearing loss, dental pain, tinnitus, vertigo, sinus pain, snoring.  CV: Denies chest pain, palpitations, irregular heartbeat, syncope, dyspnea, diaphoresis, orthopnea, PND, claudication or edema. Respiratory: denies cough, dyspnea, DOE, pleurisy, hoarseness, laryngitis, wheezing.   Gastrointestinal: Denies dysphagia, odynophagia, heartburn, reflux, water brash, abdominal pain or cramps, nausea, vomiting, bloating, diarrhea, constipation, hematemesis, melena, hematochezia  or hemorrhoids. Genitourinary: Denies dysuria, frequency, urgency, nocturia, hesitancy, discharge, hematuria or flank pain. Musculoskeletal: Denies arthralgias, myalgias, stiffness, jt. swelling, pain, limping or strain/sprain.  Skin: Denies pruritus, rash, hives, warts, acne, eczema or change in skin lesion(s). Neuro: No weakness, tremor, incoordination, spasms, paresthesia or pain. Psychiatric: Denies confusion, memory loss or sensory loss. Endo: Denies change in weight, skin or hair change.  Heme/Lymph: No excessive bleeding, bruising or enlarged lymph nodes.  Physical Exam  BP (!) 104/58   Pulse 86   Temp (!) 97.5 F (36.4 C)   Ht 5\' 1"  (1.549 m)   Wt 120 lb (54.4 kg)   SpO2 95%   BMI 22.67 kg/m   Appears  well nourished, well groomed  and in no distress.  Eyes: PERRLA, EOMs, conjunctiva no swelling or erythema. Sinuses: No frontal/maxillary tenderness ENT/Mouth: EAC's clear, TM's nl w/o erythema, bulging. Nares clear w/o erythema, swelling, exudates. Oropharynx clear without erythema or exudates. Oral hygiene is good. Tongue normal, non obstructing. Hearing intact.  Neck: Supple. Thyroid not palpable. Car 2+/2+ without bruits, nodes or JVD. Chest: Respirations nl with BS clear & equal w/o rales, rhonchi, wheezing or stridor.  Cor: Heart sounds normal w/ regular rate and rhythm without sig. murmurs, gallops, clicks or rubs. Peripheral pulses normal and equal  without edema.  Abdomen: Soft & bowel sounds normal. Non-tender w/o guarding, rebound, hernias, masses or organomegaly.  Lymphatics: Unremarkable.  Musculoskeletal: Full ROM all peripheral extremities, joint stability, 5/5 strength and normal gait.  Skin: Warm, dry without exposed rashes, lesions or ecchymosis apparent.  Neuro:  Cranial nerves intact, reflexes equal bilaterally. Sensory-motor testing grossly intact. Tendon reflexes grossly intact.  Pysch: Alert & oriented x 3.  Insight and judgement nl & appropriate. No ideations.  Assessment and Plan:  1. Labile hypertension  - Continue medication, monitor blood pressure at home.  - Continue DASH diet.  Reminder to go to the ER if any CP,  SOB, nausea, dizziness, severe HA, changes vision/speech.  - CBC with Differential/Platelet - COMPLETE METABOLIC PANEL WITH GFR - Magnesium - TSH  2. Hyperlipidemia, mixed  - Continue diet/meds, exercise,& lifestyle modifications.  - Continue monitor periodic cholesterol/liver & renal functions   - Lipid panel - TSH  3. Abnormal glucose  - Continue diet, exercise  - Lifestyle modifications.  - Monitor appropriate labs.  - Hemoglobin A1c - Insulin, random  4. Vitamin D deficiency  - Continue supplementation.  - VITAMIN D 25 Hydroxy  5. Medication management  - CBC with Differential/Platelet - COMPLETE METABOLIC PANEL WITH GFR - Magnesium - Lipid panel - TSH - Hemoglobin A1c - Insulin, random - VITAMIN D 25 Hydroxy       Discussed  regular  exercise, BP monitoring, weight control to achieve/maintain BMI less than 25 and discussed med and SE's. Recommended labs to assess and monitor clinical status with further disposition pending results of labs.  I discussed the assessment and treatment plan with the patient. The patient was provided an opportunity to ask questions and all were answered. The patient agreed with the plan and demonstrated an understanding of the instructions.  I provided over 30 minutes of exam, counseling, chart review and  complex critical decision making.   Kirtland Bouchard, MD

## 2019-12-01 ENCOUNTER — Ambulatory Visit (INDEPENDENT_AMBULATORY_CARE_PROVIDER_SITE_OTHER): Payer: Medicare Other | Admitting: Internal Medicine

## 2019-12-01 ENCOUNTER — Other Ambulatory Visit: Payer: Self-pay

## 2019-12-01 ENCOUNTER — Encounter: Payer: Self-pay | Admitting: Internal Medicine

## 2019-12-01 VITALS — BP 104/58 | HR 86 | Temp 97.5°F | Ht 61.0 in | Wt 120.0 lb

## 2019-12-01 DIAGNOSIS — E782 Mixed hyperlipidemia: Secondary | ICD-10-CM

## 2019-12-01 DIAGNOSIS — E559 Vitamin D deficiency, unspecified: Secondary | ICD-10-CM | POA: Diagnosis not present

## 2019-12-01 DIAGNOSIS — R0989 Other specified symptoms and signs involving the circulatory and respiratory systems: Secondary | ICD-10-CM

## 2019-12-01 DIAGNOSIS — K2100 Gastro-esophageal reflux disease with esophagitis, without bleeding: Secondary | ICD-10-CM

## 2019-12-01 DIAGNOSIS — R7309 Other abnormal glucose: Secondary | ICD-10-CM

## 2019-12-01 DIAGNOSIS — Z79899 Other long term (current) drug therapy: Secondary | ICD-10-CM

## 2019-12-01 MED ORDER — OMEPRAZOLE 40 MG PO CPDR: DELAYED_RELEASE_CAPSULE | ORAL | 0 refills | Status: DC

## 2019-12-02 LAB — LIPID PANEL
Cholesterol: 140 mg/dL (ref <200)
HDL: 48 mg/dL — ABNORMAL LOW (ref >=50)
LDL Cholesterol (Calc): 68 mg/dL (calc)
Non-HDL Cholesterol (Calc): 92 mg/dL (calc) (ref <130)
Total CHOL/HDL Ratio: 2.9 (calc) (ref <5.0)
Triglycerides: 164 mg/dL — ABNORMAL HIGH (ref <150)

## 2019-12-02 LAB — HEMOGLOBIN A1C
Hgb A1c MFr Bld: 5.4 % of total Hgb (ref <5.7)
Mean Plasma Glucose: 108 (calc)
eAG (mmol/L): 6 (calc)

## 2019-12-02 LAB — CBC WITH DIFFERENTIAL/PLATELET
Absolute Monocytes: 462 cells/uL (ref 200–950)
Basophils Absolute: 28 cells/uL (ref 0–200)
Basophils Relative: 0.5 %
Eosinophils Absolute: 83 cells/uL (ref 15–500)
Eosinophils Relative: 1.5 %
HCT: 37.7 % (ref 35.0–45.0)
Hemoglobin: 12.6 g/dL (ref 11.7–15.5)
Lymphs Abs: 1689 cells/uL (ref 850–3900)
MCH: 31.1 pg (ref 27.0–33.0)
MCHC: 33.4 g/dL (ref 32.0–36.0)
MCV: 93.1 fL (ref 80.0–100.0)
MPV: 9.3 fL (ref 7.5–12.5)
Monocytes Relative: 8.4 %
Neutro Abs: 3240 cells/uL (ref 1500–7800)
Neutrophils Relative %: 58.9 %
Platelets: 281 10*3/uL (ref 140–400)
RBC: 4.05 10*6/uL (ref 3.80–5.10)
RDW: 12.4 % (ref 11.0–15.0)
Total Lymphocyte: 30.7 %
WBC: 5.5 10*3/uL (ref 3.8–10.8)

## 2019-12-02 LAB — VITAMIN D 25 HYDROXY (VIT D DEFICIENCY, FRACTURES): Vit D, 25-Hydroxy: 66 ng/mL (ref 30–100)

## 2019-12-02 LAB — COMPLETE METABOLIC PANEL WITH GFR
AG Ratio: 2.2 (calc) (ref 1.0–2.5)
ALT: 30 U/L — ABNORMAL HIGH (ref 6–29)
AST: 30 U/L (ref 10–35)
Albumin: 4.6 g/dL (ref 3.6–5.1)
Alkaline phosphatase (APISO): 106 U/L (ref 37–153)
BUN: 12 mg/dL (ref 7–25)
CO2: 31 mmol/L (ref 20–32)
Calcium: 9.6 mg/dL (ref 8.6–10.4)
Chloride: 103 mmol/L (ref 98–110)
Creat: 0.73 mg/dL (ref 0.60–0.93)
GFR, Est African American: 95 mL/min/{1.73_m2} (ref >=60)
GFR, Est Non African American: 82 mL/min/{1.73_m2} (ref >=60)
Globulin: 2.1 g/dL (calc) (ref 1.9–3.7)
Glucose, Bld: 111 mg/dL — ABNORMAL HIGH (ref 65–99)
Potassium: 4.4 mmol/L (ref 3.5–5.3)
Sodium: 140 mmol/L (ref 135–146)
Total Bilirubin: 0.4 mg/dL (ref 0.2–1.2)
Total Protein: 6.7 g/dL (ref 6.1–8.1)

## 2019-12-02 LAB — INSULIN, RANDOM: Insulin: 43.6 u[IU]/mL — ABNORMAL HIGH

## 2019-12-02 LAB — TSH: TSH: 1.75 mIU/L (ref 0.40–4.50)

## 2019-12-02 LAB — MAGNESIUM: Magnesium: 2.2 mg/dL (ref 1.5–2.5)

## 2019-12-02 NOTE — Progress Notes (Signed)
====================================================  -   Test results slightly outside the reference range are not unusual. If there is anything important, I will review this with you,  otherwise it is considered normal test values.  If you have further questions,  please do not hesitate to contact me at the office or via My Chart.  ====================================================   -  Total chol = 140 and LDL = 68 - Both  Excellent   - Very low risk for Heart Attack  / Stroke =====================================================  - A1c - back in Normal Non Diabetic range - Great  =====================================================  -  Vitamin D = 66 - Excellent  =====================================================  -  All Else - CBC - Kidneys - Electrolytes - Liver - Magnesium & Thyroid    - all  Normal / OK ======================================================

## 2019-12-04 ENCOUNTER — Encounter: Payer: Self-pay | Admitting: Internal Medicine

## 2020-01-04 ENCOUNTER — Other Ambulatory Visit: Payer: Self-pay

## 2020-01-04 ENCOUNTER — Ambulatory Visit
Admission: RE | Admit: 2020-01-04 | Discharge: 2020-01-04 | Disposition: A | Discharge status: Home / Self Care | Payer: Medicare Other | Source: Ambulatory Visit | Attending: Adult Health | Admitting: Adult Health

## 2020-01-04 DIAGNOSIS — Z1231 Encounter for screening mammogram for malignant neoplasm of breast: Secondary | ICD-10-CM

## 2020-01-24 ENCOUNTER — Other Ambulatory Visit: Payer: Self-pay | Admitting: Adult Health

## 2020-01-24 DIAGNOSIS — G4700 Insomnia, unspecified: Secondary | ICD-10-CM

## 2020-02-23 ENCOUNTER — Other Ambulatory Visit: Payer: Self-pay | Admitting: Internal Medicine

## 2020-02-23 DIAGNOSIS — K2100 Gastro-esophageal reflux disease with esophagitis, without bleeding: Secondary | ICD-10-CM

## 2020-03-19 ENCOUNTER — Other Ambulatory Visit: Payer: Self-pay | Admitting: Internal Medicine

## 2020-03-19 ENCOUNTER — Other Ambulatory Visit: Payer: Self-pay | Admitting: Adult Health

## 2020-03-19 DIAGNOSIS — G4700 Insomnia, unspecified: Secondary | ICD-10-CM

## 2020-03-19 DIAGNOSIS — N3010 Interstitial cystitis (chronic) without hematuria: Secondary | ICD-10-CM

## 2020-04-26 ENCOUNTER — Encounter: Payer: Medicare Other | Admitting: Internal Medicine

## 2020-05-13 NOTE — Progress Notes (Deleted)
CPE AND FOLLOW UP  Assessment:    Diagnoses and all orders for this visit:  Encounter for CPE  Aortic atherosclerosis ([place]) Per CXR 2019 Control blood pressure, cholesterol, glucose, increase exercise.   Pulmonary emphysema, unspecified emphysema type ([place]) By imaging, monitor, asymptomatic ****  Former smoker 14 pack year, quit 2013 ****  Essential hypertension Continue medications Monitor blood pressure at home; call if consistently over 130/80 Continue DASH diet.   Reminder to go to the ER if any CP, SOB, nausea, dizziness, severe HA, changes vision/speech, left arm numbness and tingling and jaw pain.  Gastroesophageal reflux disease without esophagitis Well managed on current medications Discussed diet, avoiding triggers and other lifestyle changes  Osteopenia, unspecified location Repeat DEXA 11/2019, Emphasized high impact/weight bearing exercise, calcium, vitamin D   Vitamin D deficiency Has increased dose Continue supplementation; discussed goal of 60-100 Check vitamin D level   Major depression in remission ([place]) Improved with lexapro; uses xanax for night time panic attacks and insomnia Lifestyle discussed: diet/exerise, sleep hygiene, stress management, hydration  Insomnia, unspecified type  Reminded to limit use of benzo, <5 days/week good sleep hygiene discussed, increase day time activity  HYPERCHOLESTEROLEMIA On rosuvastatin 20 mg daily and tolerating well  LDL goal <100 Encouraged low cholesterol diet and exercise.  Check lipid panel.   Abnormal glucose Discussed diet/exercise, weight management  Check A1C q62m; check CMP  Exertional dyspnea X 2 months, No problems with flat surfaces, no concerning accompaniments EKG normal, pending CXR Given anoro sample, 1 puff daily x 1 week, follow up with progress, may add guaifenacin for AM secretions  ? conincides with allergy season, not taking antihistamine, restart Follow up with progress in 2-4  weeks  No orders of the defined types were placed in this encounter.   Over 40 minutes of exam, counseling, chart review and critical decision making was performed Future Appointments  Date Time Provider [place]  05/15/2020  3:00 PM Liane Comber, NP [facility] None  08/07/2020  3:00 PM Liane Comber, NP [facility] None  05/15/2021  3:00 PM Liane Comber, NP [facility] None     Plan:   During the course of the visit the patient was educated and counseled about appropriate screening and preventive services including:    Pneumococcal vaccine   Prevnar 13  Influenza vaccine  Td vaccine  Screening electrocardiogram  Bone densitometry screening  Colorectal cancer screening  Diabetes screening  Glaucoma screening  Nutrition counseling   Advanced directives: requested   Subjective:  Samantha [name] is a 75 y.o. female who presents for CPE. She has HYPERCHOLESTEROLEMIA; Major depression in remission ([place]); Essential hypertension; Insomnia; Esophageal reflux; Abnormal glucose; Vitamin D deficiency; Medication management; Osteopenia; Encounter for Medicare annual wellness exam; Aortic atherosclerosis ([place]); Emphysema of lung ([place]); and Exertional dyspnea on their problem list.    She has remote history of breast cancer without recurrence, was released by Dr. Jana Hakim and getting annual mammograms.   She is on lexapro for depression; feels that she is doing well on this agent. Previously on celexa.She uses the xanax at night for insomnia, not during the day. Taking at night when she has most of her panic attacks, takes 1/2-1 depending on how bad she is doing, trying to limit.   She has ICS and takes elavil PRN, worse when stressed, sleeping away from home.   She was reporting exertional dyspnea x 2 months, not with flat surfaces, just with lifting or going up hills. Reports recently can get out of  breath just making her bed or vacuuming. She does report still  able to walk 45 min on flat surface, but struggles with going up hill. Denies CP, dizziness, LE edema, PND, wheezing. She does report increased cough and congestion in the morning, congestion that clears after 10 min or so. She does endorse mild seasonal allergies, does note a cough and tightness when she goes outdoors. Admits hasn't been taking allegra regular. Was given anoro ***,   She is a former smoker, est 14 pack year hx, quit in 2013; she had unremarkable CXR 08/12/2019. Has demonstrated emphysematous changes on CXR 09/2016.   BMI is There is no height or weight on file to calculate BMI., she has not been working on diet and exercise. She does exercise 2-3 days a week, reports walks ~45 min and may do some weights.  Wt Readings from Last 3 Encounters:  12/01/19 120 lb (54.4 kg)  08/05/19 118 lb (53.5 kg)  04/07/19 116 lb 6.4 oz (52.8 kg)   Today their BP is   She does workout. She denies chest pain, dizziness.   She is on cholesterol medication (rosuvastatin 20 mg daily) and denies myalgias. Her cholesterol is at goal. The cholesterol last visit was:   Lab Results  Component Value Date   CHOL 140 12/01/2019   HDL 48 (L) 12/01/2019   LDLCALC 68 12/01/2019   TRIG 164 (H) 12/01/2019   CHOLHDL 2.9 12/01/2019    She has been working on diet and exercise for glucose management, and denies foot ulcerations, increased appetite, nausea, paresthesia of the feet, polydipsia, polyuria, visual disturbances, vomiting and weight loss. Last A1C in the office was:  Lab Results  Component Value Date   HGBA1C 5.4 12/01/2019   Last GFR: Lab Results  Component Value Date   GFRNONAA 82 12/01/2019   Patient is on Vitamin D supplement, taking 3000 IU Lab Results  Component Value Date   VD25OH 66 12/01/2019       Lab Results  Component Value Date   VITAMINB12 392 09/24/2017   Lab Results  Component Value Date   IRON 113 09/08/2014   TIBC 379 09/08/2014   FERRITIN 101 09/08/2014      Medication Review: Current Outpatient Medications on File Prior to Visit  Medication Sig Dispense Refill  . ALPRAZolam (XANAX) 1 MG tablet Take      1/2 to 1 tablet      at Bedtime      Only      if needed for Sleep and try limit to 5 days /week to Avoid Addiction or Dementia (Dx -g47.00) 30 tablet 0  . amitriptyline (ELAVIL) 10 MG tablet Take      1 to 2 tablets      at Bedtime       as needed for sleep (Dx - f32.5) 180 tablet 0  . cholecalciferol (VITAMIN D) 1000 units tablet Take 1,000 Units by mouth. Takes 3000 units daily    . escitalopram (LEXAPRO) 20 MG tablet TAKE 1 TABLET BY MOUTH DAILY FOR MOOD 90 tablet 3  . Magnesium 250 MG TABS Take 250 mg by mouth daily.    . Multiple Vitamin (MULTIVITAMIN) tablet Take 1 tablet by mouth daily. (Patient not taking: Reported on 12/01/2019)    . omeprazole (PRILOSEC) 40 MG capsule TAKE 1 CAPSULE DAILY FOR HEARTBURN & INDIGESTION 90 capsule 1  . rosuvastatin (CRESTOR) 20 MG tablet Take 1 tablet Daily for Cholesterol 90 tablet 3  . vitamin C (ASCORBIC  ACID) 500 MG tablet Take 500 mg by mouth 2 (two) times daily.      No current facility-administered medications on file prior to visit.    Allergies  Allergen Reactions  . Codeine   . Decongestant [Pseudoephedrine Hcl]     Current Problems (verified) Patient Active Problem List   Diagnosis Date Noted  . Exertional dyspnea 08/05/2019  . Aortic atherosclerosis ([place]) 09/23/2017  . Emphysema of lung ([place]) 09/23/2017  . Encounter for Medicare annual wellness exam 10/24/2014  . Osteopenia 09/08/2014  . Abnormal glucose 05/04/2014  . Vitamin D deficiency 05/04/2014  . Medication management 05/04/2014  . Esophageal reflux 01/31/2011  . HYPERCHOLESTEROLEMIA 08/15/2009  . Major depression in remission ([place]) 08/15/2009  . Essential hypertension 08/15/2009  . Insomnia 08/15/2009    Screening Tests  There is no immunization history on file for this patient.   Health Maintenance:   Last  colonoscopy: 10/2012 Last mammogram 3D: 12/2018 Last pap smear/pelvic exam: 2011 remote, declines another DEXA: 11/2017 - T fem -1.7 declines this year, get 2022 ***  Prior vaccinations: TD or Tdap: 1999 and declines Influenza: declines Pneumococcal: 2000 declines Prevnar 13 declines Shingles/Zostavax: declines  Covid 19: declines   Names of Other Physician/Practitioners you currently use: 1. [facility]- here for primary care 2. [facility], Dr. Herschel [place], eye doctor, last visit 2019, has left cataract, monitoring only, plans to schedule this year 3. Dr. Fara Boros, [facility] on [address], last visit 2021, goes q48m  Patient Care Team: Unk Pinto, MD as PCP - General (Internal Medicine) Inda Castle, MD (Inactive) as Consulting Physician (Gastroenterology) Crista Luria, MD as Consulting Physician (Dermatology) Magrinat, Virgie Dad, MD as Consulting Physician (Oncology)  SURGICAL HISTORY She  has a past surgical history that includes Abdominal hysterectomy; Cholecystectomy (2004); Nasal sinus surgery; and Breast lumpectomy (2005). FAMILY HISTORY Her family history includes Bipolar disorder in her mother; Breast cancer in her maternal aunt, maternal aunt, maternal aunt, maternal aunt, and mother; Heart disease in her mother; Heart disease (age of onset: 86) in her father; Parkinson's disease in her sister. SOCIAL HISTORY She  reports that she quit smoking about 8 years ago. She has a 14.00 pack-year smoking history. She has never used smokeless tobacco. She reports current alcohol use of about 1.0 standard drink of alcohol per week. She reports that she does not use drugs.  Depression/mood screen:   Depression screen [facility] 2/9 11/30/2019  Decreased Interest 0  Down, Depressed, Hopeless 0  PHQ - 2 Score 0  Altered sleeping -  Tired, decreased energy -  Change in appetite -  Feeling bad or failure about yourself  -  Trouble  concentrating -  Moving slowly or fidgety/restless -  Suicidal thoughts -  PHQ-9 Score -  Difficult doing work/chores -    Review of Systems  Constitutional: Negative for chills, fever, malaise/fatigue and weight loss.  HENT: Negative for hearing loss and tinnitus.   Eyes: Negative for blurred vision and double vision.  Respiratory: Negative for cough, sputum production, shortness of breath and wheezing.   Cardiovascular: Negative for chest pain, palpitations, orthopnea, claudication, leg swelling and PND.  Gastrointestinal: Negative for abdominal pain, blood in stool, constipation, diarrhea, heartburn, melena, nausea and vomiting.  Genitourinary: Negative.   Musculoskeletal: Negative for falls, joint pain and myalgias.  Skin: Negative for rash.  Neurological: Negative for dizziness, tingling, sensory change, weakness and headaches.  Endo/Heme/Allergies: Negative for polydipsia.  Psychiatric/Behavioral: Negative for depression, memory loss, substance abuse and  suicidal ideas. The patient is nervous/anxious and has insomnia.   All other systems reviewed and are negative.    Objective:     There were no vitals filed for this visit. There is no height or weight on file to calculate BMI.  General appearance: alert, no distress, WD/WN, female HEENT: normocephalic, sclerae anicteric, TMs pearly, nares patent, no discharge or erythema, pharynx normal Oral cavity: MMM, no lesions Neck: supple, no lymphadenopathy, no thyromegaly, no masses Heart: RRR, normal S1, S2, no murmurs Lungs: CTA bilaterally, no wheezes, rhonchi, or rales Abdomen: +bs, soft, non tender, non distended, no masses, no hepatomegaly, no splenomegaly Musculoskeletal: nontender, no swelling, no obvious deformity Extremities: no edema, no cyanosis, no clubbing Pulses: 2+ symmetric, upper and lower extremities, normal cap refill Neurological: alert, oriented x 3, CN2-12 intact, strength normal upper extremities and lower  extremities, sensation normal throughout, DTRs 2+ throughout, no cerebellar signs, gait normal Psychiatric: normal affect, behavior normal, pleasant  Breasts: Breasts: breasts appear normal, no suspicious masses, no skin or nipple changes or axillary nodes. GU: Defer ***   EKG: ***  The patient's weight, height, BMI, and visual acuity have been recorded in the chart.  I have made referrals, counseling, and provided education to the patient based on review of the above and I have provided the patient with a written personalized care plan for preventive services.     Samantha Ribas, NP   05/13/2020

## 2020-05-15 ENCOUNTER — Encounter: Payer: Medicare Other | Admitting: Adult Health

## 2020-05-15 DIAGNOSIS — Z1389 Encounter for screening for other disorder: Secondary | ICD-10-CM

## 2020-05-15 DIAGNOSIS — E559 Vitamin D deficiency, unspecified: Secondary | ICD-10-CM

## 2020-05-15 DIAGNOSIS — F325 Major depressive disorder, single episode, in full remission: Secondary | ICD-10-CM

## 2020-05-15 DIAGNOSIS — R0600 Dyspnea, unspecified: Secondary | ICD-10-CM

## 2020-05-15 DIAGNOSIS — G4700 Insomnia, unspecified: Secondary | ICD-10-CM

## 2020-05-15 DIAGNOSIS — Z136 Encounter for screening for cardiovascular disorders: Secondary | ICD-10-CM

## 2020-05-15 DIAGNOSIS — J439 Emphysema, unspecified: Secondary | ICD-10-CM

## 2020-05-15 DIAGNOSIS — M8580 Other specified disorders of bone density and structure, unspecified site: Secondary | ICD-10-CM

## 2020-05-15 DIAGNOSIS — Z1329 Encounter for screening for other suspected endocrine disorder: Secondary | ICD-10-CM

## 2020-05-15 DIAGNOSIS — E7800 Pure hypercholesterolemia, unspecified: Secondary | ICD-10-CM

## 2020-05-15 DIAGNOSIS — Z131 Encounter for screening for diabetes mellitus: Secondary | ICD-10-CM

## 2020-05-15 DIAGNOSIS — K219 Gastro-esophageal reflux disease without esophagitis: Secondary | ICD-10-CM

## 2020-05-15 DIAGNOSIS — I700 Atherosclerosis of aorta: Secondary | ICD-10-CM

## 2020-05-15 DIAGNOSIS — Z79899 Other long term (current) drug therapy: Secondary | ICD-10-CM

## 2020-05-15 DIAGNOSIS — R7309 Other abnormal glucose: Secondary | ICD-10-CM

## 2020-05-15 DIAGNOSIS — I10 Essential (primary) hypertension: Secondary | ICD-10-CM

## 2020-05-15 DIAGNOSIS — Z0000 Encounter for general adult medical examination without abnormal findings: Secondary | ICD-10-CM

## 2020-05-22 ENCOUNTER — Other Ambulatory Visit: Payer: Self-pay | Admitting: Internal Medicine

## 2020-05-22 DIAGNOSIS — F325 Major depressive disorder, single episode, in full remission: Secondary | ICD-10-CM

## 2020-05-22 DIAGNOSIS — N3010 Interstitial cystitis (chronic) without hematuria: Secondary | ICD-10-CM

## 2020-05-22 DIAGNOSIS — G4700 Insomnia, unspecified: Secondary | ICD-10-CM

## 2020-05-22 MED ORDER — ESCITALOPRAM OXALATE 20 MG PO TABS: ORAL_TABLET | ORAL | 0 refills | Status: DC

## 2020-06-14 ENCOUNTER — Encounter: Payer: Medicare Other | Admitting: Adult Health

## 2020-06-23 DIAGNOSIS — E538 Deficiency of other specified B group vitamins: Secondary | ICD-10-CM | POA: Insufficient documentation

## 2020-06-23 DIAGNOSIS — Z6822 Body mass index (BMI) 22.0-22.9, adult: Secondary | ICD-10-CM | POA: Insufficient documentation

## 2020-06-23 NOTE — Progress Notes (Signed)
ANNUAL WELLNESS VISIT AND FOLLOW UP  Assessment:    Diagnoses and all orders for this visit:  Encounter for Annual wellness exam Due annually  Declines all vaccines Schedule DEXA with next mammogram Get CXR  Aortic atherosclerosis ([place]) Per CXR 2019 Control blood pressure, cholesterol, glucose, increase exercise.   Pulmonary emphysema, unspecified emphysema type ([place]) By imaging, monitor, asymptomatic   Former smoker Get CXR annually; ordered  Essential hypertension Continue medications Monitor blood pressure at home; call if consistently over 130/80 Continue DASH diet.   Reminder to go to the ER if any CP, SOB, nausea, dizziness, severe HA, changes vision/speech, left arm numbness and tingling and jaw pain.  Gastroesophageal reflux disease without esophagitis Well managed on current medications Discussed diet, avoiding triggers and other lifestyle changes  Osteopenia, unspecified location Repeat DEXA 11/2019 (schedule with next mammogram), Emphasized high impact/weight bearing exercise, calcium, vitamin D   Vitamin D deficiency Has increased dose Continue supplementation; discussed goal of 60-100 Check vitamin D level   Major depression in remission ([place]) Improved with lexapro; uses xanax for night time panic attacks and insomnia Lifestyle discussed: diet/exerise, sleep hygiene, stress management, hydration  Insomnia, unspecified type  Reminded to limit use of benzo, <5 days/week good sleep hygiene discussed, increase day time activity  HYPERCHOLESTEROLEMIA On rosuvastatin 20 mg daily and tolerating well  LDL goal <100 Encouraged low cholesterol diet and exercise.  Check lipid panel.   Abnormal glucose Discussed diet/exercise, weight management  Check A1C   Orders Placed This Encounter  Procedures  . DG Bone Density  . DG Chest 2 View  . CBC with Differential/Platelet  . COMPLETE METABOLIC PANEL WITH GFR  . Magnesium  . Lipid panel  . TSH  .  Hemoglobin A1c  . VITAMIN D 25 Hydroxy (Vit-D Deficiency, Fractures)  . Microalbumin / creatinine urine ratio  . Urinalysis, Routine w reflex microscopic  . Vitamin B12  . EKG 12-Lead    Over 40 minutes of exam, counseling, chart review and critical decision making was performed Future Appointments  Date Time Provider [place]  08/07/2020  3:00 PM Liane Comber, NP [facility] None  06/26/2021 10:00 AM Liane Comber, NP [facility] None     Plan:   During the course of the visit the patient was educated and counseled about appropriate screening and preventive services including:    Pneumococcal vaccine   Prevnar 13  Influenza vaccine  Td vaccine  Screening electrocardiogram  Bone densitometry screening  Colorectal cancer screening  Diabetes screening  Glaucoma screening  Nutrition counseling   Advanced directives: requested   Subjective:  Samantha [name] is a 75 y.o. female who presents for CPE. She has HYPERCHOLESTEROLEMIA; Major depression in remission ([place]); Essential hypertension; Insomnia; Esophageal reflux; Abnormal glucose; Vitamin D deficiency; Medication management; Osteopenia; Aortic atherosclerosis ([place]); Emphysema of lung ([place]); BMI 22.0-22.9, adult; and B12 deficiency on their problem list.  She is divorced, single and happy, 1 grown daughter, 1 granddaughter - 44 y/o. 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    She has remote history of breast cancer without recurrence, was released by Dr. Jana Hakim and getting annual mammograms.   She is on lexapro for depression; feels that she is doing well on this agent. Previously on celexa. She uses the xanax at night for insomnia, not during the day. Taking at night when she has most of her panic attacks, takes 1/2-1 depending on how bad she is doing, trying to  limit.   She has ICS and takes elavil PRN, worse when stressed, sleeping away from home.    BMI is Body mass index is 23.43 kg/m., she has not been working on diet and exercise. She admits needs to restart with walking program, plans to restart walking, was doing 45 min several days a week and also free weights. Eats lots of fruit, goes through bag of apples and oranges weekly.   Wt Readings from Last 3 Encounters:  06/26/20 124 lb (56.2 kg)  12/01/19 120 lb (54.4 kg)  08/05/19 118 lb (53.5 kg)   Today their BP is BP: 118/72 She does workout. She denies chest pain, dyspnea, dizziness.   She is on cholesterol medication (rosuvastatin 20 mg daily) and denies myalgias. Her cholesterol is at goal. The cholesterol last visit was:   Lab Results  Component Value Date   CHOL 140 12/01/2019   HDL 48 (L) 12/01/2019   LDLCALC 68 12/01/2019   TRIG 164 (H) 12/01/2019   CHOLHDL 2.9 12/01/2019    She has been working on diet and exercise for glucose management, and denies foot ulcerations, increased appetite, nausea, paresthesia of the feet, polydipsia, polyuria, visual disturbances, vomiting and weight loss. Last A1C in the office was:  Lab Results  Component Value Date   HGBA1C 5.4 12/01/2019   Last GFR: Lab Results  Component Value Date   GFRNONAA 82 12/01/2019   Patient is on Vitamin D supplement, taking 3000 IU Lab Results  Component Value Date   VD25OH 66 12/01/2019          Medication Review: Current Outpatient Medications on File Prior to Visit  Medication Sig Dispense Refill  . ALPRAZolam (XANAX) 1 MG tablet TAKE 1/2 TO 1 TABLET AT BEDTIME ONLY IF NEEDED FOR SLEEP AND TRY LIMIT TO 5 DAYS /WEEK TO AVOID ADDICTION OR DEMENTIA 30 tablet 0  . amitriptyline (ELAVIL) 10 MG tablet TAKE 1 TO 2 TABLETS AT BEDTIME AS NEEDED FOR SLEEP 180 tablet 0  . cholecalciferol (VITAMIN D) 1000 units tablet Take 1,000 Units by mouth. Takes 3000 units daily    . escitalopram (LEXAPRO) 20 MG tablet Take  1 tablet  Daily  for Mood  (Dx:  f32.5) 90 tablet 0  . Magnesium 250 MG TABS Take  250 mg by mouth daily.    Marland Kitchen omeprazole (PRILOSEC) 40 MG capsule TAKE 1 CAPSULE DAILY FOR HEARTBURN & INDIGESTION 90 capsule 1  . rosuvastatin (CRESTOR) 20 MG tablet TAKE 1 TABLET BY MOUTH EVERY DAY FOR CHOLESTEROL 90 tablet 3  . vitamin C (ASCORBIC ACID) 500 MG tablet Take 500 mg by mouth 2 (two) times daily.     . Multiple Vitamin (MULTIVITAMIN) tablet Take 1 tablet by mouth daily. (Patient not taking: No sig reported)     No current facility-administered medications on file prior to visit.    Allergies  Allergen Reactions  . Codeine   . Decongestant [Pseudoephedrine Hcl]     Current Problems (verified) Patient Active Problem List   Diagnosis Date Noted  . BMI 22.0-22.9, adult 06/23/2020  . B12 deficiency 06/23/2020  . Aortic atherosclerosis ([place]) 09/23/2017  . Emphysema of lung ([place]) 09/23/2017  . Osteopenia 09/08/2014  . Abnormal glucose 05/04/2014  . Vitamin D deficiency 05/04/2014  . Medication management 05/04/2014  . Esophageal reflux 01/31/2011  . HYPERCHOLESTEROLEMIA 08/15/2009  . Major depression in remission ([place]) 08/15/2009  . Essential hypertension 08/15/2009  . Insomnia 08/15/2009    Screening Tests  There is no immunization  history on file for this patient.   Declines all vaccines.  Prior vaccinations: TD or Tdap: 1999 and declines Influenza: declines Pneumococcal: 2000 declines Prevnar 13 declines Shingles/Zostavax: declines  Covid 44: declines    Health Maintenance:   Last colonoscopy: 10/2012, 10 years, Dr. Deatra Ina Last mammogram 3D: 12/2019 Last pap smear/pelvic exam: 2011 remote, declines another DEXA: 11/2017 - T fem -1.7 declines this year, get 2022 - schedule with mammogram   Ct chest 2009  CXR 08/2019 former light intermittent smoker 1-2 pack/week, estimated 14 pack year history,  US soft tissue head 09/2014 - thyroid nodules not meeting criteria for follow up   Names of Other Physician/Practitioners you currently use: 1.  [facility]- here for primary care 2. [facility], Dr. Herschel Senegal, eye doctor, last visit 2021, has left cataract, monitoring only 3. Dr. Fara Boros, [facility] on [address], last visit 2021, goes q47m 4. Zannie Kehr, last 2021   Patient Care Team: Unk Pinto, MD as PCP - General (Internal Medicine) Inda Castle, MD (Inactive) as Consulting Physician (Gastroenterology) Crista Luria, MD as Consulting Physician (Dermatology) Magrinat, Virgie Dad, MD as Consulting Physician (Oncology)  SURGICAL HISTORY She  has a past surgical history that includes Abdominal hysterectomy; Cholecystectomy (2004); Nasal sinus surgery; and Breast lumpectomy (2005). FAMILY HISTORY Her family history includes Bipolar disorder in her brother; Breast cancer in her maternal aunt, maternal aunt, maternal aunt, maternal aunt, and mother; Heart attack in her maternal grandfather; Heart disease in her mother; Heart disease (age of onset: 62) in her father; Heart failure in her mother; Lung cancer in her daughter; Parkinson's disease in her sister; Rheum arthritis in her mother. SOCIAL HISTORY She  reports that she quit smoking about 8 years ago. She has a 14.00 pack-year smoking history. She has never used smokeless tobacco. She reports current alcohol use of about 1.0 standard drink of alcohol per week. She reports that she does not use drugs.   Review of Systems  Constitutional: Negative for chills, fever, malaise/fatigue and weight loss.  HENT: Negative for hearing loss and tinnitus.   Eyes: Negative for blurred vision and double vision.  Respiratory: Negative for cough, sputum production, shortness of breath and wheezing.   Cardiovascular: Negative for chest pain, palpitations, orthopnea, claudication, leg swelling and PND.  Gastrointestinal: Negative for abdominal pain, blood in stool, constipation, diarrhea, heartburn, melena, nausea and vomiting.  Genitourinary: Negative.    Musculoskeletal: Negative for falls, joint pain and myalgias.  Skin: Negative for rash.  Neurological: Negative for dizziness, tingling, sensory change, weakness and headaches.  Endo/Heme/Allergies: Negative for polydipsia.  Psychiatric/Behavioral: Negative for depression, memory loss, substance abuse and suicidal ideas. The patient is nervous/anxious and has insomnia.   All other systems reviewed and are negative.    Objective:     Today's Vitals   06/26/20 0959  BP: 118/72  Pulse: 96  Temp: (!) 97.2 F (36.2 C)  SpO2: 99%  Weight: 124 lb (56.2 kg)  Height: 5\' 1"  (1.549 m)   Body mass index is 23.43 kg/m.  General appearance: alert, no distress, WD/WN, female HEENT: normocephalic, sclerae anicteric, TMs pearly, nares patent, no discharge or erythema, pharynx normal Oral cavity: MMM, no lesions Neck: supple, no lymphadenopathy, no thyromegaly, no masses Heart: RRR, normal S1, S2, no murmurs Lungs: CTA bilaterally, no wheezes, rhonchi, or rales Abdomen: +bs, soft, non tender, non distended, no masses, no hepatomegaly, no splenomegaly Musculoskeletal: nontender, no swelling, no obvious deformity Extremities: no edema, no cyanosis, no clubbing  Pulses: 2+ symmetric, upper and lower extremities, normal cap refill Neurological: alert, oriented x 3, CN2-12 intact, strength normal upper extremities and lower extremities, sensation normal throughout, DTRs 2+ throughout, no cerebellar signs, gait normal Psychiatric: normal affect, behavior normal, pleasant  GU: defer, no concerns  EKG: NSR  The patient's weight, height, BMI have been recorded in the chart.  I have made referrals, counseling, and provided education to the patient based on review of the above and I have provided the patient with a written personalized care plan for preventive services.     Izora Ribas, NP   06/26/2020

## 2020-06-26 ENCOUNTER — Ambulatory Visit (INDEPENDENT_AMBULATORY_CARE_PROVIDER_SITE_OTHER): Payer: Medicare Other | Admitting: Adult Health

## 2020-06-26 ENCOUNTER — Other Ambulatory Visit: Payer: Self-pay

## 2020-06-26 ENCOUNTER — Ambulatory Visit
Admission: RE | Admit: 2020-06-26 | Discharge: 2020-06-26 | Disposition: A | Discharge status: Home / Self Care | Payer: Medicare Other | Source: Ambulatory Visit | Attending: Adult Health | Admitting: Adult Health

## 2020-06-26 ENCOUNTER — Encounter: Payer: Self-pay | Admitting: Adult Health

## 2020-06-26 VITALS — BP 118/72 | HR 96 | Temp 97.2°F | Ht 61.0 in | Wt 124.0 lb

## 2020-06-26 DIAGNOSIS — Z131 Encounter for screening for diabetes mellitus: Secondary | ICD-10-CM

## 2020-06-26 DIAGNOSIS — M8580 Other specified disorders of bone density and structure, unspecified site: Secondary | ICD-10-CM

## 2020-06-26 DIAGNOSIS — Z6822 Body mass index (BMI) 22.0-22.9, adult: Secondary | ICD-10-CM

## 2020-06-26 DIAGNOSIS — G4700 Insomnia, unspecified: Secondary | ICD-10-CM

## 2020-06-26 DIAGNOSIS — J439 Emphysema, unspecified: Secondary | ICD-10-CM

## 2020-06-26 DIAGNOSIS — E559 Vitamin D deficiency, unspecified: Secondary | ICD-10-CM

## 2020-06-26 DIAGNOSIS — E538 Deficiency of other specified B group vitamins: Secondary | ICD-10-CM

## 2020-06-26 DIAGNOSIS — Z0000 Encounter for general adult medical examination without abnormal findings: Secondary | ICD-10-CM

## 2020-06-26 DIAGNOSIS — Z1389 Encounter for screening for other disorder: Secondary | ICD-10-CM

## 2020-06-26 DIAGNOSIS — Z79899 Other long term (current) drug therapy: Secondary | ICD-10-CM

## 2020-06-26 DIAGNOSIS — I700 Atherosclerosis of aorta: Secondary | ICD-10-CM | POA: Diagnosis not present

## 2020-06-26 DIAGNOSIS — Z136 Encounter for screening for cardiovascular disorders: Secondary | ICD-10-CM

## 2020-06-26 DIAGNOSIS — I10 Essential (primary) hypertension: Secondary | ICD-10-CM

## 2020-06-26 DIAGNOSIS — Z1329 Encounter for screening for other suspected endocrine disorder: Secondary | ICD-10-CM

## 2020-06-26 DIAGNOSIS — E7800 Pure hypercholesterolemia, unspecified: Secondary | ICD-10-CM

## 2020-06-26 DIAGNOSIS — R7309 Other abnormal glucose: Secondary | ICD-10-CM

## 2020-06-26 DIAGNOSIS — K219 Gastro-esophageal reflux disease without esophagitis: Secondary | ICD-10-CM

## 2020-06-26 DIAGNOSIS — F325 Major depressive disorder, single episode, in full remission: Secondary | ICD-10-CM

## 2020-06-26 NOTE — Patient Instructions (Addendum)
  Samantha [name] , Thank you for taking time to come for your Annual Wellness Visit. I appreciate your ongoing commitment to your health goals. Please review the following plan we discussed and let me know if I can assist you in the future.   These are the goals we discussed: Goals    . Exercise 150 min/wk Moderate Activity    . LDL CALC < 100       This is a list of the screening recommended for you and due dates:  Health Maintenance  Topic Date Due  . COVID-19 Vaccine (1) Never done  . Tetanus Vaccine  Never done  . Pneumonia vaccines (1 of 2 - PCV13) Never done  . Flu Shot  Never done  . Mammogram  01/03/2022  . Colon Cancer Screening  11/05/2022  . DEXA scan (bone density measurement)  Completed  .  Hepatitis C: One time screening is recommended by Center for Disease Control  (CDC) for  adults born from [birth year] through 1964.   Completed  . HPV Vaccine  Aged Out    Can walk in to get chest xray at [address] [facility]     HOW TO SCHEDULE A MAMMOGRAM (schedule bone density with next mammogram this year 12/2020)  [facility]  7 a.m.-6:30 p.m., Monday 7 a.m.-5 p.m., Tuesday-Friday Schedule an appointment by calling [phone].  [facility] Schedule an appointment by calling [phone].      Know what a healthy weight is for you (roughly BMI <25) and aim to maintain this  Aim for 7+ servings of fruits and vegetables daily  65-80+ fluid ounces of water or unsweet tea for healthy kidneys  Limit to max 1 drink of alcohol per day; avoid smoking/tobacco  Limit animal fats in diet for cholesterol and heart health - choose grass fed whenever available  Avoid highly processed foods, and foods high in saturated/trans fats  Aim for low stress - take time to unwind and care for your mental health  Aim for 150 min of moderate intensity exercise weekly for heart health, and weights twice weekly for bone health  Aim for 7-9  hours of sleep daily    A great goal to work towards is aiming to get in a serving daily of some of the most nutritionally dense foods - G- BOMBS daily

## 2020-06-27 ENCOUNTER — Other Ambulatory Visit: Payer: Self-pay | Admitting: Adult Health

## 2020-06-27 ENCOUNTER — Encounter: Payer: Self-pay | Admitting: Adult Health

## 2020-06-27 DIAGNOSIS — J984 Other disorders of lung: Secondary | ICD-10-CM

## 2020-06-27 LAB — URINALYSIS, ROUTINE W REFLEX MICROSCOPIC
Bacteria, UA: NONE SEEN /HPF
Bilirubin Urine: NEGATIVE
Glucose, UA: NEGATIVE
Hgb urine dipstick: NEGATIVE
Hyaline Cast: NONE SEEN /LPF
Ketones, ur: NEGATIVE
Nitrite: NEGATIVE
Protein, ur: NEGATIVE
Specific Gravity, Urine: 1.01 (ref 1.001–1.03)
Squamous Epithelial / HPF: NONE SEEN /HPF (ref <=5)
pH: 6.5 (ref 5.0–8.0)

## 2020-06-27 LAB — CBC WITH DIFFERENTIAL/PLATELET
Absolute Monocytes: 434 cells/uL (ref 200–950)
Basophils Absolute: 37 cells/uL (ref 0–200)
Basophils Relative: 0.6 %
Eosinophils Absolute: 99 cells/uL (ref 15–500)
Eosinophils Relative: 1.6 %
HCT: 40.1 % (ref 35.0–45.0)
Hemoglobin: 13.6 g/dL (ref 11.7–15.5)
Lymphs Abs: 1736 cells/uL (ref 850–3900)
MCH: 32 pg (ref 27.0–33.0)
MCHC: 33.9 g/dL (ref 32.0–36.0)
MCV: 94.4 fL (ref 80.0–100.0)
MPV: 9.4 fL (ref 7.5–12.5)
Monocytes Relative: 7 %
Neutro Abs: 3894 cells/uL (ref 1500–7800)
Neutrophils Relative %: 62.8 %
Platelets: 303 10*3/uL (ref 140–400)
RBC: 4.25 10*6/uL (ref 3.80–5.10)
RDW: 12.6 % (ref 11.0–15.0)
Total Lymphocyte: 28 %
WBC: 6.2 10*3/uL (ref 3.8–10.8)

## 2020-06-27 LAB — COMPLETE METABOLIC PANEL WITH GFR
AG Ratio: 1.9 (calc) (ref 1.0–2.5)
ALT: 20 U/L (ref 6–29)
AST: 21 U/L (ref 10–35)
Albumin: 4.8 g/dL (ref 3.6–5.1)
Alkaline phosphatase (APISO): 111 U/L (ref 37–153)
BUN: 10 mg/dL (ref 7–25)
CO2: 29 mmol/L (ref 20–32)
Calcium: 9.5 mg/dL (ref 8.6–10.4)
Chloride: 100 mmol/L (ref 98–110)
Creat: 0.61 mg/dL (ref 0.60–0.93)
GFR, Est African American: 104 mL/min/{1.73_m2} (ref >=60)
GFR, Est Non African American: 90 mL/min/{1.73_m2} (ref >=60)
Globulin: 2.5 g/dL (calc) (ref 1.9–3.7)
Glucose, Bld: 95 mg/dL (ref 65–99)
Potassium: 4.2 mmol/L (ref 3.5–5.3)
Sodium: 140 mmol/L (ref 135–146)
Total Bilirubin: 0.3 mg/dL (ref 0.2–1.2)
Total Protein: 7.3 g/dL (ref 6.1–8.1)

## 2020-06-27 LAB — LIPID PANEL
Cholesterol: 201 mg/dL — ABNORMAL HIGH (ref <200)
HDL: 52 mg/dL (ref >=50)
LDL Cholesterol (Calc): 114 mg/dL (calc) — ABNORMAL HIGH
Non-HDL Cholesterol (Calc): 149 mg/dL (calc) — ABNORMAL HIGH (ref <130)
Total CHOL/HDL Ratio: 3.9 (calc) (ref <5.0)
Triglycerides: 248 mg/dL — ABNORMAL HIGH (ref <150)

## 2020-06-27 LAB — MICROALBUMIN / CREATININE URINE RATIO
Creatinine, Urine: 76 mg/dL (ref 20–275)
Microalb Creat Ratio: 17 mcg/mg creat (ref <30)
Microalb, Ur: 1.3 mg/dL

## 2020-06-27 LAB — HEMOGLOBIN A1C
Hgb A1c MFr Bld: 5.6 % of total Hgb (ref <5.7)
Mean Plasma Glucose: 114 mg/dL
eAG (mmol/L): 6.3 mmol/L

## 2020-06-27 LAB — MAGNESIUM: Magnesium: 2.5 mg/dL (ref 1.5–2.5)

## 2020-06-27 LAB — VITAMIN B12: Vitamin B-12: 381 pg/mL (ref 200–1100)

## 2020-06-27 LAB — TSH: TSH: 1.59 mIU/L (ref 0.40–4.50)

## 2020-06-27 LAB — VITAMIN D 25 HYDROXY (VIT D DEFICIENCY, FRACTURES): Vit D, 25-Hydroxy: 61 ng/mL (ref 30–100)

## 2020-07-07 ENCOUNTER — Other Ambulatory Visit: Payer: Self-pay | Admitting: Adult Health

## 2020-07-07 DIAGNOSIS — Z1231 Encounter for screening mammogram for malignant neoplasm of breast: Secondary | ICD-10-CM

## 2020-07-20 ENCOUNTER — Ambulatory Visit
Admission: RE | Admit: 2020-07-20 | Discharge: 2020-07-20 | Disposition: A | Discharge status: Home / Self Care | Payer: Medicare Other | Source: Ambulatory Visit | Attending: Adult Health | Admitting: Adult Health

## 2020-07-20 ENCOUNTER — Other Ambulatory Visit: Payer: Self-pay | Admitting: Adult Health

## 2020-07-20 DIAGNOSIS — J984 Other disorders of lung: Secondary | ICD-10-CM

## 2020-07-20 DIAGNOSIS — G4700 Insomnia, unspecified: Secondary | ICD-10-CM

## 2020-07-20 MED ORDER — IOPAMIDOL (ISOVUE-300) INJECTION 61%
75.0000 mL | Freq: Once | INTRAVENOUS | Status: AC | PRN
  Administered 2020-07-20: 75 mL via INTRAVENOUS

## 2020-07-21 ENCOUNTER — Encounter: Payer: Self-pay | Admitting: Adult Health

## 2020-07-21 DIAGNOSIS — I2510 Atherosclerotic heart disease of native coronary artery without angina pectoris: Secondary | ICD-10-CM | POA: Insufficient documentation

## 2020-08-07 ENCOUNTER — Ambulatory Visit: Payer: Medicare Other | Admitting: Adult Health

## 2020-08-19 ENCOUNTER — Other Ambulatory Visit: Payer: Self-pay | Admitting: Internal Medicine

## 2020-08-19 DIAGNOSIS — F325 Major depressive disorder, single episode, in full remission: Secondary | ICD-10-CM

## 2020-08-23 ENCOUNTER — Other Ambulatory Visit: Payer: Self-pay | Admitting: Adult Health

## 2020-08-23 DIAGNOSIS — G4700 Insomnia, unspecified: Secondary | ICD-10-CM

## 2020-08-24 ENCOUNTER — Other Ambulatory Visit: Payer: Self-pay | Admitting: Adult Health

## 2020-08-24 DIAGNOSIS — N3010 Interstitial cystitis (chronic) without hematuria: Secondary | ICD-10-CM

## 2020-10-25 ENCOUNTER — Other Ambulatory Visit: Payer: Self-pay | Admitting: Adult Health

## 2020-10-25 DIAGNOSIS — G4700 Insomnia, unspecified: Secondary | ICD-10-CM

## 2020-12-18 ENCOUNTER — Other Ambulatory Visit: Payer: Self-pay | Admitting: Adult Health

## 2020-12-18 DIAGNOSIS — G4700 Insomnia, unspecified: Secondary | ICD-10-CM

## 2020-12-27 ENCOUNTER — Ambulatory Visit: Payer: Medicare Other | Admitting: Adult Health

## 2020-12-28 ENCOUNTER — Ambulatory Visit: Payer: Medicare Other | Admitting: Adult Health

## 2021-01-04 ENCOUNTER — Other Ambulatory Visit: Payer: Medicare Other

## 2021-01-04 ENCOUNTER — Ambulatory Visit: Payer: Medicare Other

## 2021-01-22 ENCOUNTER — Ambulatory Visit: Payer: Medicare Other

## 2021-01-23 ENCOUNTER — Ambulatory Visit: Payer: Medicare Other

## 2021-01-26 DIAGNOSIS — Z853 Personal history of malignant neoplasm of breast: Secondary | ICD-10-CM | POA: Insufficient documentation

## 2021-01-26 NOTE — Progress Notes (Signed)
ANNUAL WELLNESS VISIT AND FOLLOW UP  Assessment:    Annual Medicare Wellness Visit Due annually  Health maintenance reviewed Declines all vaccines Schedule DEXA with next mammogram, declines this year  Aortic atherosclerosis ([place]) - Per CXR 2019 Control blood pressure, cholesterol, glucose, increase exercise.   Coronary artery calcification seen on CT scan Without sig fam hx; denies sx; declines cardiology evaluation at this time Disucssed risks and agreeable to proceed with medical treatment Start daily EC ASA 81 mg Control blood pressure, cholesterol (LDL <70), glucose, increase exercise.  Reviewed sx of MI in female Reminder to go to the ER if any CP, SOB, nausea, dizziness, severe fatigue, left arm numbness and tingling and jaw pain.  Pulmonary emphysema, unspecified emphysema type ([place]) By imaging, monitor, asymptomatic   Former smoker Get CXR annually; ordered  Essential hypertension Continue medications Monitor blood pressure at home; call if consistently over 130/80 Continue DASH diet.   Reminder to go to the ER if any CP, SOB, nausea, dizziness, severe HA, changes vision/speech, left arm numbness and tingling and jaw pain.  Gastroesophageal reflux disease without esophagitis Well managed on current medications Discussed diet, avoiding triggers and other lifestyle changes  Osteopenia, unspecified location Repeat DEXA 2023, declines this year Emphasized high impact/weight bearing exercise, calcium, vitamin D   Vitamin D deficiency Continue supplementation; goal of 60-100 Check vitamin D level   Major depression in remission ([place]) Improved with lexapro; uses xanax for night time panic attacks and insomnia Lifestyle discussed: diet/exerise, sleep hygiene, stress management, hydration  Insomnia, unspecified type  Reminded to limit use of benzo, <5 days/week good sleep hygiene discussed, increase day time activity  HYPERCHOLESTEROLEMIA On rosuvastatin 20 mg  daily and tolerating well  LDL goal <70 discussed with CAD on imaging Encouraged low cholesterol diet and exercise.  Check lipid panel.   Abnormal glucose Discussed diet/exercise, weight management  Check A1C   Acute URI/seasonal allergies Benign exam Discussed the importance of avoiding unnecessary antibiotic therapy. Suggested symptomatic OTC remedies. Nasal saline spray for congestion. Nasal steroids, start allergy pill, oral steroids offered Cough syrup sent in Follow up as needed if not improving    Orders Placed This Encounter  Procedures   CBC with Differential/Platelet   COMPLETE METABOLIC PANEL WITH GFR   Magnesium   Lipid panel   TSH    Over 40 minutes of exam, counseling, chart review and critical decision making was performed Future Appointments  Date Time Provider [place]  02/01/2021  2:50 PM [facility] 3 [facility]  06/26/2021 10:00 AM Liane Comber, NP [facility] None  01/29/2022 11:30 AM Liane Comber, NP [facility] None     Plan:   During the course of the visit the patient was educated and counseled about appropriate screening and preventive services including:   Pneumococcal vaccine  Prevnar 13 Influenza vaccine Td vaccine Screening electrocardiogram Bone densitometry screening Colorectal cancer screening Diabetes screening Glaucoma screening Nutrition counseling  Advanced directives: requested   Subjective:  Samantha [name] is a 75 y.o. female who presents for AWV and 6 month follow up. She has Hyperlipidemia; Major depression in remission ([place]); Essential hypertension; Insomnia; Esophageal reflux; Abnormal glucose; Vitamin D deficiency; Medication management; Osteopenia; Aortic atherosclerosis ([place]); Emphysema of lung ([place]); BMI 22.0-22.9, adult; B12 deficiency; Coronary artery calcification seen on CT scan; and History of breast cancer on their problem list.  She notes 3 weeks of intermittently productive cough,  started after [place] the mountains, more productive in AM then intermittently later  in the day, cough worse in the evening. Has noted persistent throat clearing. Has been taking mucinex. She does note prone to allergies in the fall/winter, mild, hasn't started H2i. Denies CP, congestion, dyspnea, wheezing. Denies fever/chills.   She has remote history of breast cancer of the left in 2005 without recurrence, was released by Dr. Jana Hakim and getting annual mammograms.   She is on lexapro for depression; feels that she is doing well on this agent. Previously on celexa. She uses the xanax at night for insomnia, not during the day. Taking at night when she has most of her panic attacks, takes 1/2-1 depending on how bad she is doing, trying to limit, avoids taking daily.   She has ICS and takes elavil PRN, worse when stressed, sleeping away from home.   GERD controlled, reports has been taking prilosec from leftover previous script.   BMI is Body mass index is 22.86 kg/m., she has not been working on diet and exercise. She is doing walking 30 min several days a week and also free weights. Eats lots of fruit, goes through bag of apples and oranges weekly.   Wt Readings from Last 3 Encounters:  01/30/21 121 lb (54.9 kg)  06/26/20 124 lb (56.2 kg)  12/01/19 120 lb (54.4 kg)   Former smoker (estimated 14 pack/year hx); she has centrilobular emphysema per CT chest 07/2020, some fibrosis of LLL.   Today their BP is BP: 110/66 She does workout. She denies chest pain, dyspnea, dizziness.   She has aortic atherosclerosis and CAD per CT 07/2020. Denies family hx of CAD/MI, declines cardiology referral at this time.   She is on cholesterol medication (rosuvastatin 20 mg daily) and denies myalgias. Her cholesterol is not at goal, discussed LDL <70. The cholesterol last visit was:   Lab Results  Component Value Date   CHOL 201 (H) 06/26/2020   HDL 52 06/26/2020   LDLCALC 114 (H) 06/26/2020   TRIG 248 (H)  06/26/2020   CHOLHDL 3.9 06/26/2020    She has been working on diet and exercise for glucose management, and denies foot ulcerations, increased appetite, nausea, paresthesia of the feet, polydipsia, polyuria, visual disturbances, vomiting and weight loss. Last A1C in the office was:  Lab Results  Component Value Date   HGBA1C 5.6 06/26/2020   Last GFR: Lab Results  Component Value Date   GFRNONAA 90 06/26/2020   Patient is on Vitamin D supplement, taking 3000 IU Lab Results  Component Value Date   VD25OH 61 06/26/2020     Patient has liquid B12 but admits hasn't done regularly.  Lab Results  Component Value Date   VITAMINB12 381 06/26/2020      Medication Review: Current Outpatient Medications on File Prior to Visit  Medication Sig Dispense Refill   amitriptyline (ELAVIL) 10 MG tablet TAKE 1 TO 2 TABLETS BY MOUTH AT BEDTIME AS NEEDED FOR SLEEP 180 tablet 3   cholecalciferol (VITAMIN D) 1000 units tablet Take 1,000 Units by mouth. 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     omeprazole (PRILOSEC) 40 MG capsule TAKE 1 CAPSULE DAILY FOR HEARTBURN & INDIGESTION 90 capsule 1   rosuvastatin (CRESTOR) 20 MG tablet TAKE 1 TABLET BY MOUTH EVERY DAY FOR CHOLESTEROL 90 tablet 3   No current facility-administered medications on file prior to visit.  Allergies  Allergen Reactions   Codeine    Decongestant [Pseudoephedrine Hcl]     Current Problems (verified) Patient Active Problem List   Diagnosis Date Noted   History of breast cancer 01/26/2021   Coronary artery calcification seen on CT scan 07/21/2020   BMI 22.0-22.9, adult 06/23/2020   B12 deficiency 06/23/2020   Aortic atherosclerosis ([place]) 09/23/2017   Emphysema of lung ([place]) 09/23/2017   Osteopenia 09/08/2014   Abnormal glucose 05/04/2014   Vitamin D deficiency 05/04/2014   Medication  management 05/04/2014   Esophageal reflux 01/31/2011   Hyperlipidemia 08/15/2009   Major depression in remission ([employer]) 08/15/2009   Essential hypertension 08/15/2009   Insomnia 08/15/2009    Health Maintenance  There is no immunization history on file for this patient.   Declines all vaccines.  Prior vaccinations: TD or Tdap: 1999 and declines           Influenza: declines Pneumococcal: 2000 declines Prevnar 13 declines Shingles/Zostavax: declines  Covid 3: declines     Last colonoscopy: 10/2012, 10 years, Dr. Deatra Ina Last mammogram 3D: 12/2019 - has scheduled 02/01/2021 Last pap smear/pelvic exam: 2011  remote, declines another  DEXA: 11/2017 - T fem -1.7 declines this year, get 2023  CXR 08/2019 former light intermittent smoker 1-2 pack/week, estimated 14 pack year history,  Ct chest 07/2020, centrilobular emphysema, left base fibrosis, aortic atheroslerosis, CAD  US soft tissue head 09/2014 - thyroid nodules not meeting criteria for follow up  Names of Other Physician/Practitioners you currently use: 1. [facility]- here for primary care 2. [facility], Dr. Herschel [place], eye doctor, last visit 2021, has left cataract, monitoring only 3. Dr. Fara Boros, [facility] on [address], last visit 2022, goes q65m 4. Zannie Kehr, last 2022  Patient Care Team: Unk Pinto, MD as PCP - General (Internal Medicine) Inda Castle, MD (Inactive) as Consulting Physician (Gastroenterology) Crista Luria, MD as Consulting Physician (Dermatology) Magrinat, Virgie Dad, MD as Consulting Physician (Oncology)  SURGICAL HISTORY She  has a past surgical history that includes Abdominal hysterectomy; Cholecystectomy (2004); Nasal sinus surgery; and Breast lumpectomy (2005). FAMILY HISTORY Her family history includes Bipolar disorder in her brother; Breast cancer in her maternal aunt, maternal aunt, maternal aunt, maternal aunt, and mother; Heart attack in her  maternal grandfather; Heart disease in her mother; Heart disease (age of onset: 28) in her father; Heart failure in her mother; Lung cancer in her daughter; Parkinson's disease in her sister; Rheum arthritis in her mother. SOCIAL HISTORY She  reports that she quit smoking about 8 years ago. Her smoking use included cigarettes. She has a 14.00 pack-year smoking history. She has never used smokeless tobacco. She reports current alcohol use of about 1.0 standard drink per week. She reports that she does not use drugs.  MEDICARE WELLNESS OBJECTIVES: Physical activity:   Cardiac risk factors:   Depression/mood screen:   Depression screen [facility] 2/9 06/26/2020  Decreased Interest 0  Down, Depressed, Hopeless 1  PHQ - 2 Score 1  Altered sleeping 0  Tired, decreased energy 0  Change in appetite 0  Feeling bad or failure about yourself  0  Trouble concentrating 0  Moving slowly or fidgety/restless 0  Suicidal thoughts 0  PHQ-9 Score 1  Difficult doing work/chores Not difficult at all    ADLs:  No flowsheet data found.   Cognitive Testing  Alert? Yes  Normal Appearance?Yes  Oriented to person? Yes  Place? Yes   Time? Yes  Recall of three  objects?  Yes  Can perform simple calculations? Yes  Displays appropriate judgment?Yes  Can read the correct time from a watch face?Yes  EOL planning:       Review of Systems  Constitutional:  Negative for chills, fever, malaise/fatigue and weight loss.  HENT:  Negative for congestion, hearing loss, sinus pain, sore throat and tinnitus.   Eyes:  Negative for blurred vision and double vision.  Respiratory:  Positive for cough and sputum production (intermittent). Negative for shortness of breath and wheezing.   Cardiovascular:  Negative for chest pain, palpitations, orthopnea, claudication, leg swelling and PND.  Gastrointestinal:  Negative for abdominal pain, blood in stool, constipation, diarrhea, heartburn, melena, nausea and vomiting.   Genitourinary: Negative.   Musculoskeletal:  Negative for falls, joint pain and myalgias.  Skin:  Negative for rash.  Neurological:  Negative for dizziness, tingling, sensory change, weakness and headaches.  Endo/Heme/Allergies:  Positive for environmental allergies. Negative for polydipsia.  Psychiatric/Behavioral:  Negative for depression, memory loss, substance abuse and suicidal ideas. The patient is nervous/anxious and has insomnia.   All other systems reviewed and are negative.   Objective:     Today's Vitals   01/30/21 1132  BP: 110/66  Pulse: 76  Temp: (!) 97.2 F (36.2 C)  SpO2: 99%  Weight: 121 lb (54.9 kg)   Body mass index is 22.86 kg/m.  General appearance: alert, no distress, WD/WN, female HEENT: normocephalic, sclerae anicteric, TMs pearly, nares patent, no discharge or erythema, pharynx normal Oral cavity: MMM, no lesions Neck: supple, no lymphadenopathy, no thyromegaly, no masses Heart: RRR, normal S1, S2, no murmurs Lungs: CTA bilaterally, no wheezes, rhonchi, or rales Abdomen: +bs, soft, non tender, non distended, no masses, no hepatomegaly, no splenomegaly Musculoskeletal: nontender, no swelling, no obvious deformity Extremities: no edema, no cyanosis, no clubbing Pulses: 2+ symmetric, upper and lower extremities, normal cap refill Neurological: alert, oriented x 3, CN2-12 intact, strength normal upper extremities and lower extremities, sensation normal throughout, DTRs 2+ throughout, no cerebellar signs, gait normal Psychiatric: normal affect, behavior normal, pleasant    Medicare Attestation I have personally reviewed: The patient's medical and social history Their use of alcohol, tobacco or illicit drugs Their current medications and supplements The patient's functional ability including ADLs,fall risks, home safety risks, cognitive, and hearing and visual impairment Diet and physical activities Evidence for depression or mood disorders  The  patient's weight, height, BMI, and visual acuity have been recorded in the chart.  I have made referrals, counseling, and provided education to the patient based on review of the above and I have provided the patient with a written personalized care plan for preventive services.     Izora Ribas, NP   01/30/2021

## 2021-01-30 ENCOUNTER — Encounter: Payer: Self-pay | Admitting: Adult Health

## 2021-01-30 ENCOUNTER — Other Ambulatory Visit: Payer: Self-pay

## 2021-01-30 ENCOUNTER — Ambulatory Visit (INDEPENDENT_AMBULATORY_CARE_PROVIDER_SITE_OTHER): Payer: Medicare Other | Admitting: Adult Health

## 2021-01-30 VITALS — BP 110/66 | HR 76 | Temp 97.2°F | Wt 121.0 lb

## 2021-01-30 DIAGNOSIS — R6889 Other general symptoms and signs: Secondary | ICD-10-CM | POA: Diagnosis not present

## 2021-01-30 DIAGNOSIS — M8580 Other specified disorders of bone density and structure, unspecified site: Secondary | ICD-10-CM

## 2021-01-30 DIAGNOSIS — J439 Emphysema, unspecified: Secondary | ICD-10-CM | POA: Diagnosis not present

## 2021-01-30 DIAGNOSIS — E538 Deficiency of other specified B group vitamins: Secondary | ICD-10-CM

## 2021-01-30 DIAGNOSIS — Z6822 Body mass index (BMI) 22.0-22.9, adult: Secondary | ICD-10-CM

## 2021-01-30 DIAGNOSIS — I700 Atherosclerosis of aorta: Secondary | ICD-10-CM

## 2021-01-30 DIAGNOSIS — I2510 Atherosclerotic heart disease of native coronary artery without angina pectoris: Secondary | ICD-10-CM

## 2021-01-30 DIAGNOSIS — G4700 Insomnia, unspecified: Secondary | ICD-10-CM

## 2021-01-30 DIAGNOSIS — Z0000 Encounter for general adult medical examination without abnormal findings: Secondary | ICD-10-CM

## 2021-01-30 DIAGNOSIS — Z0001 Encounter for general adult medical examination with abnormal findings: Secondary | ICD-10-CM | POA: Diagnosis not present

## 2021-01-30 DIAGNOSIS — Z79899 Other long term (current) drug therapy: Secondary | ICD-10-CM

## 2021-01-30 DIAGNOSIS — F325 Major depressive disorder, single episode, in full remission: Secondary | ICD-10-CM | POA: Diagnosis not present

## 2021-01-30 DIAGNOSIS — E559 Vitamin D deficiency, unspecified: Secondary | ICD-10-CM

## 2021-01-30 DIAGNOSIS — I10 Essential (primary) hypertension: Secondary | ICD-10-CM

## 2021-01-30 DIAGNOSIS — E785 Hyperlipidemia, unspecified: Secondary | ICD-10-CM

## 2021-01-30 DIAGNOSIS — R7309 Other abnormal glucose: Secondary | ICD-10-CM

## 2021-01-30 DIAGNOSIS — Z853 Personal history of malignant neoplasm of breast: Secondary | ICD-10-CM

## 2021-01-30 MED ORDER — PROMETHAZINE-DM 6.25-15 MG/5ML PO SYRP: 5.0000 mL | ORAL_SOLUTION | Freq: Four times a day (QID) | ORAL | 1 refills | Status: DC | PRN

## 2021-01-30 MED ORDER — ALPRAZOLAM 1 MG PO TABS: ORAL_TABLET | ORAL | 0 refills | Status: DC

## 2021-01-30 MED ORDER — FEXOFENADINE HCL 180 MG PO TABS: 180.0000 mg | ORAL_TABLET | Freq: Every day | ORAL | 1 refills | Status: DC

## 2021-01-30 NOTE — Patient Instructions (Signed)
Coronary Artery Disease, Female Coronary artery disease (CAD) is a condition in which the arteries that lead to the heart (coronary arteries) become narrow or blocked. The narrowing or blockage can lead to decreased blood flow to the heart. Prolonged reduced blood flow can cause a heart attack (myocardial infarction or MI). This condition may also be called coronary heart disease. Because CAD is the leading cause of death in women, it is important to understand what causes this condition and how it is treated. What are the causes? CAD is most often caused by atherosclerosis. This is the buildup of fat and cholesterol (plaque) on the inside of the arteries. Over time, the plaque may narrow or block the artery, reducing blood flow to the heart. Plaque can also become weak and break off within a coronary artery and cause a sudden blockage. Other less common causes of CAD include: A blood clot or a piece of a blood clot or other substance that blocks the flow of blood in a coronary artery (embolism). A tearing of the artery (spontaneous coronary artery dissection). An enlargement of an artery (aneurysm). Inflammation (vasculitis) in the artery wall. What increases the risk? The following factors may make you more likely to develop this condition: Age. Women over age 55 are at a greater risk of CAD. Family history of CAD. High blood pressure (hypertension). Diabetes. High cholesterol levels. Tobacco use. Lack of exercise. Menopause. All postmenopausal women are at greater risk of CAD. Women who have experienced menopause between the ages of 40-45 (early menopause) are at a higher risk of CAD. Women who have experienced menopause before age 40 (premature menopause) are at a very high risk of CAD. Excessive alcohol use. A diet high in saturated and trans fats, such as fried food and processed meat. Other possible risk factors include: High stress levels. Depression. Obesity. Sleep apnea. What  are the signs or symptoms? Many people do not have any symptoms during the early stages of CAD. As the condition progresses, symptoms may include: Chest pain (angina). The pain can: Feel like crushing or squeezing, or like a tightness, pressure, fullness, or heaviness in the chest. Last more than a few minutes or can stop and recur. The pain tends to get worse with exercise or stress and to fade with rest. Pain in the arms, neck, jaw, ear, or back. Unexplained heartburn or indigestion. Shortness of breath. Nausea. Sudden cold sweats. Sudden light-headedness. Fluttering or fast heartbeat (palpitations). Many women have chest discomfort and the other symptoms. However, women often have unusual (atypical) symptoms, such as: Fatigue. Vomiting. Unexplained feelings of nervousness or anxiety. Unexplained weakness. Dizziness or fainting. How is this diagnosed? This condition is diagnosed based on: Your family and medical history. A physical exam. Tests, including: A test to check the electrical signals in your heart (electrocardiogram). Exercise stress test. This looks for signs of blockage when the heart is stressed with exercise, such as running on a treadmill. Pharmacologic stress test. This test looks for signs of blockage when the heart is being stressed with a medicine. Blood tests. Coronary angiogram. This is a procedure to look at the coronary arteries to see if there is any blockage. During this test, a dye is injected into your arteries so they appear on an X-ray. Coronary artery CT scan. This CT scan helps detect calcium deposits in your coronary arteries. Calcium deposits are an indicator of CAD. A test that uses sound waves to take a picture of your heart (echocardiogram). Chest X-ray. How is   this treated? This condition may be treated by: Healthy lifestyle changes to reduce risk factors. Medicines such as: Antiplatelet medicines and blood-thinning medicines, such as aspirin.  These help to prevent blood clots. Nitroglycerin. Blood pressure medicines. Cholesterol-lowering medicine. Coronary angioplasty and stenting. During this procedure, a thin, flexible tube is inserted through a blood vessel and into a blocked artery. A balloon or similar device on the end of the tube is inflated to open up the artery. In some cases, a small, mesh tube (stent) is inserted into the artery to keep it open. Coronary artery bypass surgery. During this surgery, veins or arteries from other parts of the body are used to create a bypass around the blockage and allow blood to reach your heart. Follow these instructions at home: Medicines Take over-the-counter and prescription medicines only as told by your health care provider. Do not take the following medicines unless your health care provider approves: NSAIDs, such as ibuprofen, naproxen, or celecoxib. Vitamin supplements that contain vitamin A, vitamin E, or both. Hormone replacement therapy that contains estrogen with or without progestin. Lifestyle Follow an exercise program approved by your health care provider. Aim for 150 minutes of moderate exercise or 75 minutes of vigorous exercise each week. Maintain a healthy weight or lose weight as approved by your health care provider. Learn to manage stress or try to limit your stress. Ask your health care provider for suggestions if you need help. Get screened for depression and seek treatment, if needed. Do not use any products that contain nicotine or tobacco, such as cigarettes, e-cigarettes, and chewing tobacco. If you need help quitting, ask your health care provider. Do not use illegal drugs. Eating and drinking  Follow a heart-healthy diet. A dietitian can help educate you about healthy food options and changes. In general, eat plenty of fruits and vegetables, lean meats, and whole grains. Avoid foods high in: Sugar. Salt (sodium). Saturated fats, such as processed or fatty  meat. Trans fats, such as fried food. Use healthy cooking methods such as roasting, grilling, broiling, baking, poaching, steaming, or stir-frying. Do not drink alcohol if: Your health care provider tells you not to drink. You are pregnant, may be pregnant, or are planning to become pregnant. If you drink alcohol: Limit how much you have to 0-1 drink a day. Be aware of how much alcohol is in your drink. In the [place] one drink equals one 12 oz bottle of beer (355 mL), one 5 oz glass of wine (148 mL), or one 1 oz glass of hard liquor (44 mL). General instructions Manage any other health conditions, such as hypertension and diabetes. These conditions affect your heart. Your health care provider may ask you to monitor your blood pressure. Ideally, your blood pressure should be below 130/80. Keep all follow-up visits as told by your health care provider. This is important. Get help right away if: You have pain in your chest, neck, ear, arm, jaw, stomach, or back that: Lasts more than a few minutes. Is recurring. Is not relieved by taking medicine under your tongue (sublingual nitroglycerin). You have profuse sweating without cause. You have unexplained: Heartburn or indigestion. Shortness of breath or difficulty breathing. Fluttering or fast heartbeat (palpitations). Nausea or vomiting. Fatigue. Feelings of nervousness or anxiety. Weakness. Diarrhea. You have sudden light-headedness or dizziness. You faint. You feel like hurting yourself or think about taking your own life. These symptoms may represent a serious problem that is an emergency. Do not wait to see if the   symptoms will go away. Get medical help right away. Call your local emergency services (911 in the [place] Do not drive yourself to the hospital. Summary Coronary artery disease (CAD) is a condition in which the arteries that lead to the heart (coronary arteries) become narrow or blocked. The narrowing or blockage can lead to  a heart attack. Many women have chest discomfort and other common symptoms of CAD. However, women often have unusual (atypical) symptoms, such as fatigue, vomiting, weakness, or dizziness. CAD can be treated with lifestyle changes, medicines, surgery, or a combination of these treatments. This information is not intended to replace advice given to you by your health care provider. Make sure you discuss any questions you have with your health care provider. Document Revised: 12/12/2017 Document Reviewed: 12/02/2017 Elsevier Patient Education  2022 [employer]  

## 2021-01-31 LAB — COMPLETE METABOLIC PANEL WITH GFR
AG Ratio: 1.6 (calc) (ref 1.0–2.5)
ALT: 18 U/L (ref 6–29)
AST: 21 U/L (ref 10–35)
Albumin: 4.9 g/dL (ref 3.6–5.1)
Alkaline phosphatase (APISO): 102 U/L (ref 37–153)
BUN: 12 mg/dL (ref 7–25)
CO2: 29 mmol/L (ref 20–32)
Calcium: 9.7 mg/dL (ref 8.6–10.4)
Chloride: 100 mmol/L (ref 98–110)
Creat: 0.71 mg/dL (ref 0.60–1.00)
Globulin: 3 g/dL (calc) (ref 1.9–3.7)
Glucose, Bld: 87 mg/dL (ref 65–99)
Potassium: 4.1 mmol/L (ref 3.5–5.3)
Sodium: 138 mmol/L (ref 135–146)
Total Bilirubin: 0.5 mg/dL (ref 0.2–1.2)
Total Protein: 7.9 g/dL (ref 6.1–8.1)
eGFR: 89 mL/min/{1.73_m2} (ref >=60)

## 2021-01-31 LAB — CBC WITH DIFFERENTIAL/PLATELET
Absolute Monocytes: 637 cells/uL (ref 200–950)
Basophils Absolute: 27 cells/uL (ref 0–200)
Basophils Relative: 0.3 %
Eosinophils Absolute: 36 cells/uL (ref 15–500)
Eosinophils Relative: 0.4 %
HCT: 39.8 % (ref 35.0–45.0)
Hemoglobin: 13.1 g/dL (ref 11.7–15.5)
Lymphs Abs: 1811 cells/uL (ref 850–3900)
MCH: 31.2 pg (ref 27.0–33.0)
MCHC: 32.9 g/dL (ref 32.0–36.0)
MCV: 94.8 fL (ref 80.0–100.0)
MPV: 9.6 fL (ref 7.5–12.5)
Monocytes Relative: 7 %
Neutro Abs: 6588 cells/uL (ref 1500–7800)
Neutrophils Relative %: 72.4 %
Platelets: 373 10*3/uL (ref 140–400)
RBC: 4.2 10*6/uL (ref 3.80–5.10)
RDW: 12.5 % (ref 11.0–15.0)
Total Lymphocyte: 19.9 %
WBC: 9.1 10*3/uL (ref 3.8–10.8)

## 2021-01-31 LAB — LIPID PANEL
Cholesterol: 128 mg/dL (ref <200)
HDL: 43 mg/dL — ABNORMAL LOW (ref >=50)
LDL Cholesterol (Calc): 63 mg/dL (calc)
Non-HDL Cholesterol (Calc): 85 mg/dL (calc) (ref <130)
Total CHOL/HDL Ratio: 3 (calc) (ref <5.0)
Triglycerides: 135 mg/dL (ref <150)

## 2021-01-31 LAB — TSH: TSH: 1.84 mIU/L (ref 0.40–4.50)

## 2021-01-31 LAB — MAGNESIUM: Magnesium: 2.3 mg/dL (ref 1.5–2.5)

## 2021-02-01 ENCOUNTER — Other Ambulatory Visit: Payer: Self-pay

## 2021-02-01 ENCOUNTER — Ambulatory Visit
Admission: RE | Admit: 2021-02-01 | Discharge: 2021-02-01 | Disposition: A | Discharge status: Home / Self Care | Payer: Medicare Other | Source: Ambulatory Visit | Attending: Adult Health | Admitting: Adult Health

## 2021-02-01 ENCOUNTER — Other Ambulatory Visit: Payer: Self-pay | Admitting: Adult Health

## 2021-02-01 DIAGNOSIS — Z1231 Encounter for screening mammogram for malignant neoplasm of breast: Secondary | ICD-10-CM

## 2021-02-01 DIAGNOSIS — I2510 Atherosclerotic heart disease of native coronary artery without angina pectoris: Secondary | ICD-10-CM

## 2021-02-04 NOTE — Progress Notes (Signed)
CARDIOLOGY CONSULT NOTE       Patient ID: Samantha [name] MRN: [id number] DOB/AGE: [birth year] 74 y.o.  Admit date: (Not on file) Referring Physician: Melford Aase Primary Physician: Unk Pinto, MD Primary Cardiologist: New Reason for Consultation: CAD  Active Problems:   * No active hospital problems. *   HPI:  75 y.o. referred by Dr Melford Aase for CAD. She is a previous smoker with significant COPD/Emphsema She had a lung cancer screening CT done 07/20/20 that showed moderate aortic atherosclerosis and calcification of the LAD. CRF;s include HTN, HLD and glucose intolerance. 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 Like to have beer on occasion   ROS All other systems reviewed and negative except as noted above  Past Medical History:  Diagnosis Date   Anxiety    Breast cancer ([place])    left    Depressive disorder, not elsewhere classified    Diverticulosis of colon (without mention of hemorrhage) 2007   Colonoscopy   Esophagitis, unspecified 2001   EGD   Female genital prolapse 01/26/2019   GERD (gastroesophageal reflux disease)    Hx of colonic polyps 2003   Colonoscopy    IBS (irritable bowel syndrome)    Insomnia, unspecified    Internal hemorrhoids without mention of complication 1505   Colonoscopy    Personal history of radiation therapy 04/08/2004   Pure hypercholesterolemia    Stricture and stenosis of esophagus 2001, 2007   EGD   Unspecified essential hypertension     Family History  Problem Relation Age of Onset   Breast cancer Mother    Heart disease Mother    Rheum arthritis Mother    Heart failure Mother    Heart disease Father 40   Parkinson's disease Sister    Breast cancer Maternal Aunt    Breast cancer Maternal Aunt    Breast cancer Maternal Aunt    Breast cancer Maternal Aunt     Bipolar disorder Brother    Heart attack Maternal Grandfather    Lung cancer Daughter        non-smoker   Colon cancer Neg Hx     Social History   Socioeconomic History   Marital status: Divorced    Spouse name: Not on file   Number of children: 1   Years of education: Not on file   Highest education level: Not on file  Occupational History   Occupation: Retired  Tobacco Use   Smoking status: Former    Packs/day: 0.40    Years: 35.00    Pack years: 14.00    Types: Cigarettes    Quit date: 03/28/2012    Years since quitting: 8.8   Smokeless tobacco: Never  Vaping Use   Vaping Use: Never used  Substance and Sexual Activity   Alcohol use: Yes    Alcohol/week: 1.0 standard drink    Types: 1 Glasses of wine per week   Drug use: No   Sexual activity: Not Currently    Partners: Male    Birth control/protection: Post-menopausal  Other Topics Concern   Not on file  Social History Narrative   0 caffeine drinks daily    Social Determinants of Health   Financial Resource Strain: Not on file  Food Insecurity: Not on file  Transportation Needs: Not on file  Physical Activity:  Not on file  Stress: Not on file  Social Connections: Not on file  Intimate Partner Violence: Not on file    Past Surgical History:  Procedure Laterality Date   ABDOMINAL HYSTERECTOMY     with 1 ovary spared ? patient unsure    BREAST LUMPECTOMY  2005   left   CHOLECYSTECTOMY  2004   NASAL SINUS SURGERY        Current Outpatient Medications:    ALPRAZolam (XANAX) 1 MG tablet, TAKE 1/2 TO 1 TABLET BY MOUTH AT BEDTIME ONLY IF NEEDED FOR SLEEP AND TRY LIMIT TO 5 DAYS /WEEK TO AVOID ADDICTION OR DEMENTIA, Disp: 30 tablet, Rfl: 0   amitriptyline (ELAVIL) 10 MG tablet, TAKE 1 TO 2 TABLETS BY MOUTH AT BEDTIME AS NEEDED FOR SLEEP, Disp: 180 tablet, Rfl: 3   aspirin EC 81 MG tablet, Take 81 mg by mouth daily. Swallow whole., Disp: , Rfl:    cholecalciferol (VITAMIN D) 1000 units tablet, Take 1,000 Units  by mouth. Takes 3000 units daily, Disp: , Rfl:    Cyanocobalamin (B12 LIQUID HEALTH BOOSTER PO), Take 1 drop by mouth daily., Disp: , Rfl:    fexofenadine (ALLEGRA) 180 MG tablet, Take 1 tablet (180 mg total) by mouth daily. (Patient taking differently: Take 180 mg by mouth daily as needed.), Disp: 30 tablet, Rfl: 1   ketoconazole (NIZORAL) 2 % cream, APPLY TO AFFECTED AREA TWICE A DAY, Disp: , Rfl:    ketoconazole (NIZORAL) 2 % shampoo, Apply 1 application topically 2 (two) times a week., Disp: , Rfl:    Magnesium 250 MG TABS, Take 250 mg by mouth daily., Disp: , Rfl:    omeprazole (PRILOSEC) 40 MG capsule, TAKE 1 CAPSULE DAILY FOR HEARTBURN & INDIGESTION (Patient taking differently: as needed. 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  No HSM or HJR Distal pulses intact with no bruits No edema Neuro non-focal Skin warm and dry No muscular weakness   Labs:   Lab Results  Component Value Date   WBC 9.1 01/30/2021   HGB 13.1 01/30/2021   HCT 39.8 01/30/2021   MCV 94.8 01/30/2021   PLT 373 01/30/2021    No results for input(s): NA, K, CL, CO2, BUN, CREATININE, CALCIUM, PROT, BILITOT, ALKPHOS, ALT, AST, GLUCOSE  in the last 168 hours.  Invalid input(s): LABALBU  No results found for: CKTOTAL, CKMB, CKMBINDEX, TROPONINI  Lab Results  Component Value Date   CHOL 128 01/30/2021   CHOL 201 (H) 06/26/2020   CHOL 140 12/01/2019   Lab Results  Component Value Date   HDL 43 (L) 01/30/2021   HDL 52 06/26/2020   HDL 48 (L) 12/01/2019   Lab Results  Component Value  Date   LDLCALC 63 01/30/2021   LDLCALC 114 (H) 06/26/2020   LDLCALC 68 12/01/2019   Lab Results  Component Value Date   TRIG 135 01/30/2021   TRIG 248 (H) 06/26/2020   TRIG 164 (H) 12/01/2019   Lab Results  Component Value Date   CHOLHDL 3.0 01/30/2021   CHOLHDL 3.9 06/26/2020   CHOLHDL 2.9 12/01/2019   No results found for: LDLDIRECT    Radiology: MM 3D SCREEN BREAST BILATERAL  Result Date: 02/03/2021 CLINICAL DATA:  Screening. EXAM: DIGITAL SCREENING BILATERAL MAMMOGRAM WITH TOMOSYNTHESIS AND CAD TECHNIQUE: Bilateral screening digital craniocaudal and mediolateral oblique mammograms were obtained. Bilateral screening digital breast tomosynthesis was performed. The images were evaluated with computer-aided detection. COMPARISON:  Previous exam(s). ACR Breast Density Category c: The breast tissue is heterogeneously dense, which may obscure small masses. FINDINGS: There are no findings suspicious for malignancy. IMPRESSION: No mammographic evidence of malignancy. A result letter of this screening mammogram will be mailed directly to the patient. RECOMMENDATION: Screening mammogram in one year. (Code:[id number]-B-01Y) BI-RADS CATEGORY  1: Negative. Electronically Signed   By: Dorise Bullion III M.D.   On: 02/03/2021 12:38    EKG: NSR normal   ASSESSMENT AND PLAN:   CAD: not symptomatic seen on lung cancer screening CT ON ASA and statin Reasonable to do baseline ETT  HTN: Well controlled.  Continue current medications and low sodium Dash type diet.   HLD  continue statin LDL at goal Pre DM:  low carb diet follow A1c  ETT F/U PRN  if normal  Signed: Jenkins Rouge 02/07/2021, 1:53 PM

## 2021-02-07 ENCOUNTER — Encounter: Payer: Self-pay | Admitting: Cardiovascular Disease

## 2021-02-07 ENCOUNTER — Other Ambulatory Visit: Payer: Self-pay

## 2021-02-07 ENCOUNTER — Ambulatory Visit (INDEPENDENT_AMBULATORY_CARE_PROVIDER_SITE_OTHER): Payer: Medicare Other | Admitting: Cardiovascular Disease

## 2021-02-07 VITALS — BP 118/70 | HR 64 | Ht 61.0 in | Wt 120.0 lb

## 2021-02-07 DIAGNOSIS — I2510 Atherosclerotic heart disease of native coronary artery without angina pectoris: Secondary | ICD-10-CM

## 2021-02-07 NOTE — Patient Instructions (Signed)
Medication Instructions:  *If you need a refill on your cardiac medications before your next appointment, please call your pharmacy*  Lab Work: If you have labs (blood work) drawn today and your tests are completely normal, you will receive your results only by: [place] (if you have MyChart) OR A paper copy in the mail If you have any lab test that is abnormal or we need to change your treatment, we will call you to review the results.  Testing/Procedures: Your physician has requested that you have an exercise tolerance test. For further information please visit [email or web address]. Please also follow instruction sheet, as given.  Follow-Up: At [facility], you and your health needs are our priority.  As part of our continuing mission to provide you with exceptional heart care, we have created designated Provider Care Teams.  These Care Teams include your primary Cardiologist (physician) and Advanced Practice Providers (APPs -  Physician Assistants and Nurse Practitioners) who all work together to provide you with the care you need, when you need it.  We recommend signing up for the patient portal called "MyChart".  Sign up information is provided on this After Visit Summary.  MyChart is used to connect with patients for Virtual Visits (Telemedicine).  Patients are able to view lab/test results, encounter notes, upcoming appointments, etc.  Non-urgent messages can be sent to your provider as well.   To learn more about what you can do with MyChart, go to [email or web address].    Your next appointment:   As needed  The format for your next appointment:   In Person  Provider:   You may see Dr. Johnsie Cancel or one of the following Advanced Practice Providers on your designated Care Team:   Cecilie Kicks, NP

## 2021-02-12 ENCOUNTER — Telehealth: Payer: Self-pay | Admitting: Cardiovascular Disease

## 2021-02-12 NOTE — Telephone Encounter (Signed)
Patient was schedule for GXT on 02-20-21.   Called today and stated don't want to take the test.  Test was canceled.

## 2021-02-14 ENCOUNTER — Ambulatory Visit: Payer: Medicare Other | Admitting: Interventional Cardiology

## 2021-04-03 ENCOUNTER — Other Ambulatory Visit: Payer: Self-pay | Admitting: Adult Health

## 2021-04-03 DIAGNOSIS — G4700 Insomnia, unspecified: Secondary | ICD-10-CM

## 2021-05-15 ENCOUNTER — Encounter: Payer: Medicare Other | Admitting: Adult Health

## 2021-06-01 ENCOUNTER — Other Ambulatory Visit: Payer: Self-pay | Admitting: Nurse Practitioner

## 2021-06-01 DIAGNOSIS — G4700 Insomnia, unspecified: Secondary | ICD-10-CM

## 2021-06-02 ENCOUNTER — Other Ambulatory Visit: Payer: Self-pay | Admitting: Adult Health

## 2021-06-02 DIAGNOSIS — N3010 Interstitial cystitis (chronic) without hematuria: Secondary | ICD-10-CM

## 2021-06-23 NOTE — Progress Notes (Signed)
CPE ? ?Assessment:  ? ? ?Encounter for Annual Physical Exam with abnormal findings ?Due annually  ?Health Maintenance reviewed ?Healthy lifestyle reviewed and goals set ? ?Declines all vaccines ?Declines DEXA this year, plan in 2024 with mammogram per discussion today  ? ?Aortic atherosclerosis ([place]) - Per CXR 2019 ?Control blood pressure, cholesterol, glucose, increase exercise.  ? ?Coronary artery calcification seen on CT scan ?Without sig fam hx; denies sx; declines cardiology evaluation at this time ?Disucssed risks and agreeable to proceed with medical treatment ?Encourage daily EC ASA 81 mg ?Control blood pressure, cholesterol (LDL <70), glucose, increase exercise.  ?Reviewed sx of MI in female ?Reminder to go to the ER if any CP, SOB, nausea, dizziness, severe fatigue, left arm numbness and tingling and jaw pain. ? ?Pulmonary emphysema, unspecified emphysema type ([place]) ?By imaging, monitor, asymptomatic, declines pulm referral or inhaler ? ?Former smoker ?Not candidate for low dose CT; denies sx; monitor  ? ?Essential hypertension ?Continue medications ?Monitor blood pressure at home; call if consistently over 130/80 ?Continue DASH diet.   ?Reminder to go to the ER if any CP, SOB, nausea, dizziness, severe HA, changes vision/speech, left arm numbness and tingling and jaw pain. ? ?Gastroesophageal reflux disease without esophagitis ?Well managed on current medications ?Discussed diet, avoiding triggers and other lifestyle changes ? ?Osteopenia, unspecified location ?Repeat DEXA 2024, declines this year ?Emphasized high impact/weight bearing exercise, calcium, vitamin D ?  ?Vitamin D deficiency ?Continue supplementation; goal of 60-100 ?Check vitamin D level  ? ?Major depression in remission [facility]) ?Improved with lexapro; uses xanax for night time panic attacks and insomnia ?Lifestyle discussed: diet/exerise, sleep hygiene, stress management, hydration ? ?Insomnia, unspecified type ? Reminded to limit use of  benzo, <5 days/week ?Will give 0.5 mg tabs next fill to help reduce/taper further ?good sleep hygiene discussed, increase day time activity ? ?HYPERCHOLESTEROLEMIA ?On rosuvastatin 20 mg daily and tolerating well  ?LDL goal <70 discussed with CAD on imaging ?Encouraged low cholesterol diet and exercise.  ?Check lipid panel.  ? ?Abnormal glucose ?Discussed diet/exercise, weight management  ?Check A1C  ? ? ?Orders Placed This Encounter  ?Procedures  ? CBC with Differential/Platelet  ? COMPLETE METABOLIC PANEL WITH GFR  ? Magnesium  ? Lipid panel  ? TSH  ? Hemoglobin A1c  ? VITAMIN D 25 Hydroxy (Vit-D Deficiency, Fractures)  ? Vitamin B12  ? Microalbumin / creatinine urine ratio  ? Urinalysis, Routine w reflex microscopic  ? EKG 12-Lead  ? ? ?Over 40 minutes of exam, counseling, chart review and critical decision making was performed ?Future Appointments  ?Date Time Provider [place]  ?01/29/2022 11:30 AM Liane Comber, NP [facility] None  ?02/04/2022  9:00 AM [facility] MM 2 [facility]  ?06/27/2022  9:00 AM Liane Comber, NP [facility] None  ? ? ? ?Plan:  ? ?During the course of the visit the patient was educated and counseled about appropriate screening and preventive services including:  ? ?Pneumococcal vaccine  ?Prevnar 13 ?Influenza vaccine ?Td vaccine ?Screening electrocardiogram ?Bone densitometry screening ?Colorectal cancer screening ?Diabetes screening ?Glaucoma screening ?Nutrition counseling  ?Advanced directives: requested ? ? ?Subjective:  ?Samantha [name] is a 76 y.o. female who presents for CPE and 6 month follow up. She has Hyperlipidemia; Major depression in remission ([place]); Essential hypertension; Insomnia; Esophageal reflux; Abnormal glucose; Vitamin D deficiency; Medication management; Osteopenia; Aortic atherosclerosis ([place]); Emphysema of lung ([place]); BMI 22.0-22.9, adult; B12 deficiency; Coronary artery calcification seen on CT scan; and History of breast cancer on their  problem list. ? ?She is divorced, single and happy, 1 grown daughter, 1 granddaughter. 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 Likes spending time  ? ?She has remote history of breast cancer of the left in 2005 without recurrence, was released by Dr. Jana Hakim and getting annual mammograms at the breast center, UTD and benign.  ? ?She is on lexapro for depression; feels that she is doing well on this agent. She uses the xanax at night for insomnia/panic attacks, rarely needs during the day. Taking at night when she has most of her panic attacks, takes 1/2-1 depending on how bad she is doing, trying to limit, avoids taking daily.  ? ?She has ICS and takes elavil PRN, worse when stressed, sleeping away from home.  ? ?GERD controlled, reports has been taking omeprazole PRN, doesn't need daily.  ? ?BMI is Body mass index is 23.17 kg/m?., she has not been working on diet and exercise. She is doing walking 30 min several days a week and also free weights. Eats lots of fruit, goes through bag of apples and oranges weekly.   ?Wt Readings from Last 3 Encounters:  ?06/26/21 121 lb 9.6 oz (55.2 kg)  ?02/07/21 120 lb (54.4 kg)  ?01/30/21 121 lb (54.9 kg)  ? ?Former smoker (estimated 14 pack/year hx); she has centrilobular emphysema per CT chest 07/2020, some fibrosis of LLL.  ? ?Today their BP is BP: 104/64 ?She does workout. She denies chest pain, dyspnea, dizziness.  ? ?She has aortic atherosclerosis and CAD per CT 07/2020. Denies family hx of CAD/MI, declines cardiology referral at this time.  ? ?She is on cholesterol medication (rosuvastatin 20 mg daily) and denies myalgias. Her cholesterol is at goal LDL <70. The cholesterol last visit was:   ?Lab Results  ?Component Value Date  ? CHOL 128 01/30/2021  ? HDL 43 (L) 01/30/2021  ? [place] 63 01/30/2021  ? TRIG 135 01/30/2021  ? CHOLHDL 3.0 01/30/2021  ? ? She has been working on diet and exercise for glucose management, and  denies foot ulcerations, increased appetite, nausea, paresthesia of the feet, polydipsia, polyuria, visual disturbances, vomiting and weight loss. Last A1C in the office was:  ?Lab Results  ?Component Value Date  ? HGBA1C 5.6 06/26/2020  ? ?Last GFR: ?Lab Results  ?Component Value Date  ? GFRNONAA 90 06/26/2020  ? ?Patient is on Vitamin D supplement, taking 3000 IU ?Lab Results  ?Component Value Date  ? VD25OH 61 06/26/2020  ?   ?Patient has liquid B12 drops daily.  ?Lab Results  ?Component Value Date  ? [id number] 381 06/26/2020  ? ? ? ? ?Medication Review: ?Current Outpatient Medications on File Prior to Visit  ?Medication Sig Dispense Refill  ? amitriptyline (ELAVIL) 10 MG tablet TAKE 1 TO 2 TABLETS BY MOUTH AT BEDTIME AS NEEDED FOR SLEEP 180 tablet 3  ? cholecalciferol (VITAMIN D) 1000 units tablet Take 1,000 Units by mouth.    ? Cyanocobalamin (B12 LIQUID HEALTH BOOSTER PO) Take 1 drop by mouth daily.    ? 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 ketoconazole (NIZORAL) 2 % cream daily as needed.    ? Magnesium 250 MG TABS Take 250 mg by mouth daily.    ? omeprazole (PRILOSEC) 40 MG capsule TAKE 1 CAPSULE DAILY FOR HEARTBURN & INDIGESTION (Patient taking differently: as needed. Take 1 capsule Daily for Heartburn & Indigestion) 90  capsule 1  ? rosuvastatin (CRESTOR) 20 MG tablet TAKE 1 TABLET BY MOUTH EVERY DAY FOR CHOLESTEROL 90 tablet 3  ? aspirin EC 81 MG tablet Take 81 mg by mouth daily. Swallow whole. (Patient not taking: Reported on 06/26/2021)    ? ?No current facility-administered medications on file prior to visit.  ? ? ?Allergies  ?Allergen Reactions  ? Codeine   ? Decongestant [Pseudoephedrine Hcl]   ? ? ?Current Problems (verified) ?Patient Active Problem List  ? Diagnosis Date Noted  ? History of breast cancer 01/26/2021  ? Coronary artery calcification seen on CT scan 07/21/2020  ? BMI 22.0-22.9, adult 06/23/2020  ? B12 deficiency 06/23/2020  ?  Aortic atherosclerosis ([place]) 09/23/2017  ? Emphysema of lung ([place]) 09/23/2017  ? Osteopenia 09/08/2014  ? Abnormal glucose 05/04/2014  ? Vitamin D deficiency 05/04/2014  ? Medication management 05/04/2014  ? Esophage

## 2021-06-25 ENCOUNTER — Other Ambulatory Visit: Payer: Self-pay | Admitting: Adult Health

## 2021-06-25 DIAGNOSIS — Z1231 Encounter for screening mammogram for malignant neoplasm of breast: Secondary | ICD-10-CM

## 2021-06-26 ENCOUNTER — Ambulatory Visit (INDEPENDENT_AMBULATORY_CARE_PROVIDER_SITE_OTHER): Payer: Medicare Other | Admitting: Adult Health

## 2021-06-26 ENCOUNTER — Encounter: Payer: Self-pay | Admitting: Adult Health

## 2021-06-26 ENCOUNTER — Other Ambulatory Visit: Payer: Self-pay

## 2021-06-26 VITALS — BP 104/64 | HR 75 | Temp 97.2°F | Ht 60.75 in | Wt 121.6 lb

## 2021-06-26 DIAGNOSIS — I2510 Atherosclerotic heart disease of native coronary artery without angina pectoris: Secondary | ICD-10-CM

## 2021-06-26 DIAGNOSIS — Z136 Encounter for screening for cardiovascular disorders: Secondary | ICD-10-CM

## 2021-06-26 DIAGNOSIS — Z0000 Encounter for general adult medical examination without abnormal findings: Secondary | ICD-10-CM | POA: Diagnosis not present

## 2021-06-26 DIAGNOSIS — F325 Major depressive disorder, single episode, in full remission: Secondary | ICD-10-CM

## 2021-06-26 DIAGNOSIS — Z853 Personal history of malignant neoplasm of breast: Secondary | ICD-10-CM

## 2021-06-26 DIAGNOSIS — J439 Emphysema, unspecified: Secondary | ICD-10-CM

## 2021-06-26 DIAGNOSIS — Z6822 Body mass index (BMI) 22.0-22.9, adult: Secondary | ICD-10-CM

## 2021-06-26 DIAGNOSIS — I10 Essential (primary) hypertension: Secondary | ICD-10-CM | POA: Diagnosis not present

## 2021-06-26 DIAGNOSIS — E785 Hyperlipidemia, unspecified: Secondary | ICD-10-CM

## 2021-06-26 DIAGNOSIS — G4700 Insomnia, unspecified: Secondary | ICD-10-CM

## 2021-06-26 DIAGNOSIS — Z131 Encounter for screening for diabetes mellitus: Secondary | ICD-10-CM

## 2021-06-26 DIAGNOSIS — M8580 Other specified disorders of bone density and structure, unspecified site: Secondary | ICD-10-CM

## 2021-06-26 DIAGNOSIS — Z1329 Encounter for screening for other suspected endocrine disorder: Secondary | ICD-10-CM

## 2021-06-26 DIAGNOSIS — R7309 Other abnormal glucose: Secondary | ICD-10-CM

## 2021-06-26 DIAGNOSIS — K2100 Gastro-esophageal reflux disease with esophagitis, without bleeding: Secondary | ICD-10-CM

## 2021-06-26 DIAGNOSIS — Z79899 Other long term (current) drug therapy: Secondary | ICD-10-CM

## 2021-06-26 DIAGNOSIS — E559 Vitamin D deficiency, unspecified: Secondary | ICD-10-CM

## 2021-06-26 DIAGNOSIS — Z0001 Encounter for general adult medical examination with abnormal findings: Secondary | ICD-10-CM

## 2021-06-26 DIAGNOSIS — Z1389 Encounter for screening for other disorder: Secondary | ICD-10-CM

## 2021-06-26 DIAGNOSIS — I700 Atherosclerosis of aorta: Secondary | ICD-10-CM

## 2021-06-26 DIAGNOSIS — E538 Deficiency of other specified B group vitamins: Secondary | ICD-10-CM

## 2021-06-26 MED ORDER — OMEPRAZOLE 40 MG PO CPDR: 40.0000 mg | DELAYED_RELEASE_CAPSULE | ORAL | 1 refills | Status: DC | PRN

## 2021-06-26 MED ORDER — ALPRAZOLAM 0.5 MG PO TABS: ORAL_TABLET | ORAL | 0 refills | Status: DC

## 2021-06-26 NOTE — Patient Instructions (Signed)
?Ms. [name] , ?Thank you for taking time to come for your [name] Visit. I appreciate your ongoing commitment to your health goals. Please review the following plan we discussed and let me know if I can assist you in the future.  ? ?These are the goals we discussed: ? Goals   ? ?  Exercise 150 min/wk Moderate Activity   ?  LDL CALC < 100   ? ?  ?  ?This is a list of the screening recommended for you and due dates:  ?Health Maintenance  ?Topic Date Due  ? Flu Shot  07/06/2021*  ? COVID-19 Vaccine (1) 07/12/2021*  ? Pneumonia Vaccine (1 - PCV) 01/30/2022*  ? Tetanus Vaccine  01/30/2022*  ? DEXA scan (bone density measurement)  06/27/2022*  ? Mammogram  02/01/2022  ? Colon Cancer Screening  11/05/2022  ? Hepatitis C Screening: USPSTF Recommendation to screen - Ages 60-79 yo.  Completed  ? HPV Vaccine  Aged Out  ? Zoster (Shingles) Vaccine  Discontinued  ?*Topic was postponed. The date shown is not the original due date.  ? ? ? ?Know what a healthy weight is for you (roughly BMI <25) and aim to maintain this ? ?Aim for 7+ servings of fruits and vegetables daily ? ?65-80+ fluid ounces of water or unsweet tea for healthy kidneys ? ?Limit to max 1 drink of alcohol per day; avoid smoking/tobacco ? ?Limit animal fats in diet for cholesterol and heart health - choose grass fed whenever available ? ?Avoid highly processed foods, and foods high in saturated/trans fats ? ?Aim for low stress - take time to unwind and care for your mental health ? ?Aim for 150 min of moderate intensity exercise weekly for heart health, and weights twice weekly for bone health ? ?Aim for 7-9 hours of sleep daily ? ? ? ? ? ?A great goal to work towards is aiming to get in a serving daily of some of the most nutritionally dense foods - G- BOMBS daily  ? ? ? ? ? ? ? ? ?Back Exercises ?The following exercises strengthen the muscles that help to support the trunk (torso) and back. They also help to keep the lower back flexible. Doing these  exercises can help to prevent or lessen existing low back pain. ?If you have back pain or discomfort, try doing these exercises 2-3 times each day or as told by your health care provider. ?As your pain improves, do them once each day, but increase the number of times that you repeat the steps for each exercise (do more repetitions). ?To prevent the recurrence of back pain, continue to do these exercises once each day or as told by your health care provider. ?Do exercises exactly as told by your health care provider and adjust them as directed. It is normal to feel mild stretching, pulling, tightness, or discomfort as you do these exercises, but you should stop right away if you feel sudden pain or your pain gets worse. ?Exercises ?Single knee to chest ?Repeat these steps 3-5 times for each leg: ?Lie on your back on a firm bed or the floor with your legs extended. ?Bring one knee to your chest. Your other leg should stay extended and in contact with the floor. ?Hold your knee in place by grabbing your knee or thigh with both hands and hold. ?Pull on your knee until you feel a gentle stretch in your lower back or buttocks. ?Hold the stretch for 10-30 seconds. ?Slowly release and  straighten your leg. ? ?Pelvic tilt ?Repeat these steps 5-10 times: ?Lie on your back on a firm bed or the floor with your legs extended. ?Bend your knees so they are pointing toward the ceiling and your feet are flat on the floor. ?Tighten your lower abdominal muscles to press your lower back against the floor. This motion will tilt your pelvis so your tailbone points up toward the ceiling instead of pointing to your feet or the floor. ?With gentle tension and even breathing, hold this position for 5-10 seconds. ? ?Cat-cow ?Repeat these steps until your lower back becomes more flexible: ?Get into a hands-and-knees position on a firm bed or the floor. Keep your hands under your shoulders, and keep your knees under your hips. You may place  padding under your knees for comfort. ?Let your head hang down toward your chest. Contract your abdominal muscles and point your tailbone toward the floor so your lower back becomes rounded like the back of a cat. ?Hold this position for 5 seconds. ?Slowly lift your head, let your abdominal muscles relax, and point your tailbone up toward the ceiling so your back forms a sagging arch like the back of a cow. ?Hold this position for 5 seconds. ? ?Press-ups ?Repeat these steps 5-10 times: ?Lie on your abdomen (face-down) on a firm bed or the floor. ?Place your palms near your head, about shoulder-width apart. ?Keeping your back as relaxed as possible and keeping your hips on the floor, slowly straighten your arms to raise the top half of your body and lift your shoulders. Do not use your back muscles to raise your upper torso. You may adjust the placement of your hands to make yourself more comfortable. ?Hold this position for 5 seconds while you keep your back relaxed. ?Slowly return to lying flat on the floor. ? ?Bridges ?Repeat these steps 10 times: ?Lie on your back on a firm bed or the floor. ?Bend your knees so they are pointing toward the ceiling and your feet are flat on the floor. Your arms should be flat at your sides, next to your body. ?Tighten your buttocks muscles and lift your buttocks off the floor until your waist is at almost the same height as your knees. You should feel the muscles working in your buttocks and the back of your thighs. If you do not feel these muscles, slide your feet 1-2 inches (2.5-5 cm) farther away from your buttocks. ?Hold this position for 3-5 seconds. ?Slowly lower your hips to the starting position, and allow your buttocks muscles to relax completely. ?If this exercise is too easy, try doing it with your arms crossed over your chest. ?Abdominal crunches ?Repeat these steps 5-10 times: ?Lie on your back on a firm bed or the floor with your legs extended. ?Bend your knees so  they are pointing toward the ceiling and your feet are flat on the floor. ?Cross your arms over your chest. ?Tip your chin slightly toward your chest without bending your neck. ?Tighten your abdominal muscles and slowly raise your torso high enough to lift your shoulder blades a tiny bit off the floor. Avoid raising your torso higher than that because it can put too much stress on your lower back and does not help to strengthen your abdominal muscles. ?Slowly return to your starting position. ? ?Back lifts ?Repeat these steps 5-10 times: ?Lie on your abdomen (face-down) with your arms at your sides, and rest your forehead on the floor. ?Tighten the muscles in  your legs and your buttocks. ?Slowly lift your chest off the floor while you keep your hips pressed to the floor. Keep the back of your head in line with the curve in your back. Your eyes should be looking at the floor. ?Hold this position for 3-5 seconds. ?Slowly return to your starting position. ? ?Contact a health care provider if: ?Your back pain or discomfort gets much worse when you do an exercise. ?Your worsening back pain or discomfort does not lessen within 2 hours after you exercise. ?If you have any of these problems, stop doing these exercises right away. Do not do them again unless your health care provider says that you can. ?Get help right away if: ?You develop sudden, severe back pain. If this happens, stop doing the exercises right away. Do not do them again unless your health care provider says that you can. ?This information is not intended to replace advice given to you by your health care provider. Make sure you discuss any questions you have with your health care provider. ?Document Revised: 09/19/2020 Document Reviewed: 06/07/2020 ?Elsevier Patient Education ? 2022 [employer]. ? ? ?

## 2021-06-27 ENCOUNTER — Other Ambulatory Visit: Payer: Self-pay | Admitting: Adult Health

## 2021-06-27 DIAGNOSIS — R3129 Other microscopic hematuria: Secondary | ICD-10-CM

## 2021-06-27 LAB — COMPLETE METABOLIC PANEL WITH GFR
AG Ratio: 1.7 (calc) (ref 1.0–2.5)
ALT: 23 U/L (ref 6–29)
AST: 23 U/L (ref 10–35)
Albumin: 4.8 g/dL (ref 3.6–5.1)
Alkaline phosphatase (APISO): 95 U/L (ref 37–153)
BUN: 13 mg/dL (ref 7–25)
CO2: 33 mmol/L — ABNORMAL HIGH (ref 20–32)
Calcium: 9.7 mg/dL (ref 8.6–10.4)
Chloride: 101 mmol/L (ref 98–110)
Creat: 0.78 mg/dL (ref 0.60–1.00)
Globulin: 2.8 g/dL (calc) (ref 1.9–3.7)
Glucose, Bld: 92 mg/dL (ref 65–99)
Potassium: 4.3 mmol/L (ref 3.5–5.3)
Sodium: 141 mmol/L (ref 135–146)
Total Bilirubin: 0.6 mg/dL (ref 0.2–1.2)
Total Protein: 7.6 g/dL (ref 6.1–8.1)
eGFR: 80 mL/min/{1.73_m2} (ref >=60)

## 2021-06-27 LAB — HEMOGLOBIN A1C
Hgb A1c MFr Bld: 5.6 % of total Hgb (ref <5.7)
Mean Plasma Glucose: 114 mg/dL
eAG (mmol/L): 6.3 mmol/L

## 2021-06-27 LAB — URINALYSIS, ROUTINE W REFLEX MICROSCOPIC
Bacteria, UA: NONE SEEN /HPF
Bilirubin Urine: NEGATIVE
Glucose, UA: NEGATIVE
Hyaline Cast: NONE SEEN /LPF
Ketones, ur: NEGATIVE
Nitrite: NEGATIVE
Specific Gravity, Urine: 1.022 (ref 1.001–1.035)
pH: 6 (ref 5.0–8.0)

## 2021-06-27 LAB — CBC WITH DIFFERENTIAL/PLATELET
Absolute Monocytes: 345 cells/uL (ref 200–950)
Basophils Absolute: 42 cells/uL (ref 0–200)
Basophils Relative: 0.8 %
Eosinophils Absolute: 111 cells/uL (ref 15–500)
Eosinophils Relative: 2.1 %
HCT: 41.4 % (ref 35.0–45.0)
Hemoglobin: 14 g/dL (ref 11.7–15.5)
Lymphs Abs: 1574 cells/uL (ref 850–3900)
MCH: 31.8 pg (ref 27.0–33.0)
MCHC: 33.8 g/dL (ref 32.0–36.0)
MCV: 94.1 fL (ref 80.0–100.0)
MPV: 9.6 fL (ref 7.5–12.5)
Monocytes Relative: 6.5 %
Neutro Abs: 3228 cells/uL (ref 1500–7800)
Neutrophils Relative %: 60.9 %
Platelets: 313 10*3/uL (ref 140–400)
RBC: 4.4 10*6/uL (ref 3.80–5.10)
RDW: 13.3 % (ref 11.0–15.0)
Total Lymphocyte: 29.7 %
WBC: 5.3 10*3/uL (ref 3.8–10.8)

## 2021-06-27 LAB — LIPID PANEL
Cholesterol: 213 mg/dL — ABNORMAL HIGH (ref <200)
HDL: 54 mg/dL (ref >=50)
LDL Cholesterol (Calc): 119 mg/dL (calc) — ABNORMAL HIGH
Non-HDL Cholesterol (Calc): 159 mg/dL (calc) — ABNORMAL HIGH (ref <130)
Total CHOL/HDL Ratio: 3.9 (calc) (ref <5.0)
Triglycerides: 283 mg/dL — ABNORMAL HIGH (ref <150)

## 2021-06-27 LAB — VITAMIN B12: Vitamin B-12: 894 pg/mL (ref 200–1100)

## 2021-06-27 LAB — TSH: TSH: 2.17 mIU/L (ref 0.40–4.50)

## 2021-06-27 LAB — VITAMIN D 25 HYDROXY (VIT D DEFICIENCY, FRACTURES): Vit D, 25-Hydroxy: 53 ng/mL (ref 30–100)

## 2021-06-27 LAB — MAGNESIUM: Magnesium: 2.3 mg/dL (ref 1.5–2.5)

## 2021-06-27 LAB — MICROALBUMIN / CREATININE URINE RATIO
Creatinine, Urine: 239 mg/dL (ref 20–275)
Microalb Creat Ratio: 10 mcg/mg creat (ref <30)
Microalb, Ur: 2.4 mg/dL

## 2021-06-27 MED ORDER — ROSUVASTATIN CALCIUM 20 MG PO TABS: ORAL_TABLET | ORAL | 3 refills | Status: DC

## 2021-07-24 ENCOUNTER — Ambulatory Visit
Admission: RE | Admit: 2021-07-24 | Discharge: 2021-07-24 | Disposition: A | Discharge status: Home / Self Care | Payer: Medicare Other | Source: Ambulatory Visit | Attending: Adult Health | Admitting: Adult Health

## 2021-07-24 DIAGNOSIS — R3129 Other microscopic hematuria: Secondary | ICD-10-CM

## 2021-07-26 ENCOUNTER — Encounter: Payer: Self-pay | Admitting: Adult Health

## 2021-07-26 DIAGNOSIS — R3129 Other microscopic hematuria: Secondary | ICD-10-CM | POA: Insufficient documentation

## 2021-09-07 ENCOUNTER — Other Ambulatory Visit: Payer: Self-pay | Admitting: Adult Health

## 2021-09-07 DIAGNOSIS — G4700 Insomnia, unspecified: Secondary | ICD-10-CM

## 2021-10-23 ENCOUNTER — Ambulatory Visit: Payer: Medicare Other | Admitting: Nurse Practitioner

## 2021-10-24 ENCOUNTER — Ambulatory Visit (INDEPENDENT_AMBULATORY_CARE_PROVIDER_SITE_OTHER): Payer: Medicare Other | Admitting: Nurse Practitioner

## 2021-10-24 VITALS — BP 124/74 | HR 58 | Temp 97.2°F | Ht 61.0 in | Wt 124.0 lb

## 2021-10-24 DIAGNOSIS — R300 Dysuria: Secondary | ICD-10-CM | POA: Diagnosis not present

## 2021-10-24 DIAGNOSIS — N3000 Acute cystitis without hematuria: Secondary | ICD-10-CM

## 2021-10-24 DIAGNOSIS — R350 Frequency of micturition: Secondary | ICD-10-CM | POA: Diagnosis not present

## 2021-10-24 MED ORDER — NITROFURANTOIN MONOHYD MACRO 100 MG PO CAPS: 100.0000 mg | ORAL_CAPSULE | Freq: Two times a day (BID) | ORAL | 0 refills | Status: AC

## 2021-10-24 NOTE — Patient Instructions (Signed)
Asymptomatic Bacteriuria Asymptomatic bacteriuria is the presence of a large number of bacteria in the urine without the usual symptoms of burning or frequent urination. This is usually not harmful, and treatment may not be needed. A person with this condition will not be more likely to develop an infection in the future. What are the causes? This condition is caused by an increase in bacteria in the urine. This increase can be caused by: Bacteria entering the urinary tract, such as during sex. A blockage in the urinary tract, such as from kidney stones or a tumor. Bladder problems that prevent the bladder from emptying. What increases the risk? You are more likely to develop this condition if: You have diabetes. You are an older adult. This especially affects older adults in long-term care facilities. You are pregnant and in the first trimester. You have kidney stones. You are female. You have had a kidney transplant. You have a leaky kidney tube valve (reflux). You had a urinary catheter for a long period of time. This is a long, thin tube that collects urine. What are the signs or symptoms? There are no symptoms of this condition. How is this diagnosed? This condition is diagnosed with a urine test. Because this condition does not cause symptoms, it is usually diagnosed when a urine sample is taken to treat or diagnose another condition, such as pregnancy or kidney problems. Most women who are in their first trimester of pregnancy are screened for asymptomatic bacteriuria. How is this treated? Usually, treatment is not needed for this condition. Treating the condition can lead to other problems, such as a yeast infection or the growth of bacteria that do not respond to treatment (antibiotic-resistant bacteria). Some people do need treatment with antibiotic medicines to prevent kidney infection, known as pyelonephritis. Treatment is needed if: You are pregnant. In pregnant women, kidney  infection can lead to: Early labor (premature labor). Very low birth weight (fetal growth restriction). Newborn death. You are having a procedure that affects the urinary tract. You have had a kidney transplant. If you are diagnosed with this condition, talk with your health care provider about any concerns that you have. Follow these instructions at home: Medicines Take over-the-counter and prescription medicines only as told by your health care provider. If you were prescribed an antibiotic medicine, take it as told by your health care provider. Do not stop using the antibiotic even if you start to feel better. General instructions Monitor your condition for any changes. Drink enough fluid to keep your urine pale yellow. Urinate more often to keep your bladder empty. If you are female, keep the area around your vagina and rectum clean. Wipe from front to back after urinating or having a bowel movement. Use each piece of toilet paper only once. Keep all follow-up visits. This is important. Contact a health care provider if: You have symptoms of a urine infection, such as: A burning sensation, or pain when you urinate. A strong need to urinate, or urinating more often. Urine turning discolored or cloudy. Blood in your urine. Urine that smells bad. Get help right away if: You develop signs of a kidney infection, such as: Back pain or pelvic pain. A fever or chills. Nausea or vomiting. Severe pain that cannot be controlled with medicine. Summary Asymptomatic bacteriuria is the presence of a large number of bacteria in the urine without the usual symptoms of burning or frequent urination. Usually, treatment is not needed for this condition. Treating the condition can lead   to other problems, such as a yeast infection or the growth of bacteria that do not respond to treatment. Some people do need treatment. Treatment is needed if you are pregnant, if you are having a procedure that  affects the urinary tract, or if you have had a kidney transplant. If you were prescribed an antibiotic medicine, take it as told by your health care provider. Do not stop using the antibiotic even if you start to feel better. This information is not intended to replace advice given to you by your health care provider. Make sure you discuss any questions you have with your health care provider. Document Revised: 11/05/2019 Document Reviewed: 11/05/2019 Elsevier Patient Education  2023 [employer] Inc.  

## 2021-10-24 NOTE — Progress Notes (Signed)
Assessment and Plan:  Samantha [name] was seen today for a episodic visit.  Diagnoses and all order for this visit: \ 1. Acute cystitis without hematuria Continue to stay well hydrated. Consider cranberry supplement OTC AZO for symptoms control. Start Macrobid UA w/Cx results pending.  - Urinalysis w microscopic + reflex cultur - nitrofurantoin, macrocrystal-monohydrate, (MACROBID) 100 MG capsule; Take 1 capsule (100 mg total) by mouth 2 (two) times daily for 5 days.  Dispense: 10 capsule; Refill: 0  2. Dysuria  - Urinalysis w microscopic + reflex cultur  3. Urinary frequency   Continue to monitor for any increase in fever, chills, N/V,hematuria, abdominal pain.  Notify office for further evaluation and treatment, questions or concerns if s/s fail to improve. The risks and benefits of my recommendations, as well as other treatment options were discussed with the patient today. Questions were answered.  Further disposition pending results of labs. Discussed med's effects and SE's.    Over 15 minutes of exam, counseling, chart review, and critical decision making was performed.   Future Appointments  Date Time Provider [place]  01/29/2022  9:30 AM Alycia Rossetti, NP [facility] None  02/04/2022  9:00 AM [facility] 2 [facility]  07/01/2022 10:00 AM Unk Pinto, MD [facility] None    ------------------------------------------------------------------------------------------------------------------   HPI BP 124/74   Pulse (!) 58   Temp (!) 97.2 F (36.2 C)   Ht '5\' 1"'$  (1.549 m)   Wt 124 lb (56.2 kg)   SpO2 96%   BMI 23.43 kg/m    Samantha [name] is a 76 y.o. female who complains of dysuria, frequency, and hesitancy for 3 days.  Patient also complains of left flank pain. Patient denies back pain and fever, N/V.  Patient does not have a history of recurrent UTI.  Patient does not have a history of pyelonephritis.   Past Medical History:   Diagnosis Date   Anxiety    Breast cancer ([place])    left    Depressive disorder, not elsewhere classified    Diverticulosis of colon (without mention of hemorrhage) 2007   Colonoscopy   Esophagitis, unspecified 2001   EGD   Female genital prolapse 01/26/2019   GERD (gastroesophageal reflux disease)    Hx of colonic polyps 2003   Colonoscopy    IBS (irritable bowel syndrome)    Insomnia, unspecified    Internal hemorrhoids without mention of complication 7017   Colonoscopy    Personal history of radiation therapy 04/08/2004   Pure hypercholesterolemia    Stricture and stenosis of esophagus 2001, 2007   EGD   Unspecified essential hypertension      Allergies  Allergen Reactions   Codeine    Decongestant [Pseudoephedrine Hcl]     Current Outpatient Medications on File Prior to Visit  Medication Sig   ALPRAZolam (XANAX) 0.5 MG tablet TAKE 1 TAB TWICE A DAY AS NEEDED FOR SEVERE ANXIETY/PANIC ATTACK. AVOID USING DAILY, USE SPARINGLY AND LIMIT TO LESS THAN 5 DAYS A WEEK AVOID HABITUATION.   amitriptyline (ELAVIL) 10 MG tablet TAKE 1 TO 2 TABLETS BY MOUTH AT BEDTIME AS NEEDED FOR SLEEP   cholecalciferol (VITAMIN D) 1000 units tablet Take 1,000 Units by mouth.   Cyanocobalamin (B12 LIQUID HEALTH BOOSTER PO) Take 1 drop by mouth daily.   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   Magnesium 250 MG TABS Take 250 mg  by mouth daily.   omeprazole (PRILOSEC) 40 MG capsule Take 1 capsule (40 mg total) by mouth as needed. Take 1 capsule Daily for Heartburn & Indigestion   rosuvastatin (CRESTOR) 20 MG tablet TAKE 1 TABLET BY MOUTH EVERY DAY FOR CHOLESTEROL   aspirin EC 81 MG tablet Take 81 mg by mouth daily. Swallow whole. (Patient not taking: Reported on 06/26/2021)   No current facility-administered medications on file prior to visit.    ROS: all negative except what is noted in the HPI.      Physical Exam:  BP 124/74   Pulse (!) 58   Temp (!) 97.2 F (36.2 C)   Ht '5\' 1"'$  (1.549 m)   Wt 124 lb (56.2 kg)   SpO2 96%   BMI 23.43 kg/m   General Appearance: NAD.  Awake, conversant and cooperative. Eyes: PERRLA, EOMs intact.  Sclera white.  Conjunctiva without erythema. Sinuses: No frontal/maxillary tenderness.  No nasal discharge. Nares patent.  ENT/Mouth: Ext aud canals clear.  Bilateral TMs w/DOL and without erythema or bulging. Hearing intact.  Posterior pharynx without swelling or exudate.  Tonsils without swelling or erythema.  Neck: Supple.  No masses, nodules or thyromegaly. Respiratory: Effort is regular with non-labored breathing. Breath sounds are equal bilaterally without rales, rhonchi, wheezing or stridor.  Cardio: RRR with no MRGs. Brisk peripheral pulses without edema.  Abdomen: Active BS in all four quadrants.  Soft and non-tender without guarding, rebound tenderness, hernias or masses. Lymphatics: Non tender without lymphadenopathy.  Musculoskeletal: Full ROM, 5/5 strength, normal ambulation.  No clubbing or cyanosis. Skin: Appropriate color for ethnicity. Warm without rashes, lesions, ecchymosis, ulcers.  Neuro: CN II-XII grossly normal. Normal muscle tone without cerebellar symptoms and intact sensation.   Psych: AO X 3,  appropriate mood and affect, insight and judgment.     Darrol Jump, NP 9:56 AM [facility]

## 2021-10-27 LAB — URINE CULTURE
MICRO NUMBER:: 13671903
SPECIMEN QUALITY:: ADEQUATE

## 2021-10-27 LAB — CULTURE INDICATED

## 2021-10-27 LAB — URINALYSIS W MICROSCOPIC + REFLEX CULTURE
Bilirubin Urine: NEGATIVE
Glucose, UA: NEGATIVE
Hyaline Cast: NONE SEEN /LPF
Ketones, ur: NEGATIVE
Nitrites, Initial: POSITIVE — AB
Protein, ur: NEGATIVE
Specific Gravity, Urine: 1.005 (ref 1.001–1.035)
Squamous Epithelial / HPF: NONE SEEN /HPF (ref <=5)
pH: 6.5 (ref 5.0–8.0)

## 2021-11-01 ENCOUNTER — Telehealth: Payer: Self-pay | Admitting: Nurse Practitioner
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
## 2021-11-02 ENCOUNTER — Other Ambulatory Visit: Payer: Self-pay | Admitting: Nurse Practitioner

## 2021-11-02 DIAGNOSIS — N3010 Interstitial cystitis (chronic) without hematuria: Secondary | ICD-10-CM

## 2021-11-02 MED ORDER — AMITRIPTYLINE HCL 10 MG PO TABS: 10.0000 mg | ORAL_TABLET | Freq: Every evening | ORAL | 3 refills | Status: DC | PRN

## 2021-12-18 ENCOUNTER — Other Ambulatory Visit: Payer: Self-pay | Admitting: Nurse Practitioner

## 2021-12-18 DIAGNOSIS — G4700 Insomnia, unspecified: Secondary | ICD-10-CM

## 2022-01-29 ENCOUNTER — Ambulatory Visit: Payer: Medicare Other | Admitting: Adult Health

## 2022-01-29 ENCOUNTER — Ambulatory Visit: Payer: Medicare Other | Admitting: Nurse Practitioner

## 2022-02-04 ENCOUNTER — Ambulatory Visit
Admission: RE | Admit: 2022-02-04 | Discharge: 2022-02-04 | Disposition: A | Discharge status: Home / Self Care | Payer: Medicare Other | Source: Ambulatory Visit | Attending: Adult Health | Admitting: Adult Health

## 2022-02-04 DIAGNOSIS — Z1231 Encounter for screening mammogram for malignant neoplasm of breast: Secondary | ICD-10-CM

## 2022-02-05 NOTE — Progress Notes (Unsigned)
ANNUAL WELLNESS VISIT AND FOLLOW UP  Assessment:    Annual Medicare Wellness Visit Due annually  Health maintenance reviewed Declines all vaccines Schedule DEXA with next mammogram, declines this year  Aortic atherosclerosis ([place]) - Per CXR 2019 Control blood pressure, cholesterol, glucose, increase exercise.   Coronary artery calcification seen on CT scan Without sig fam hx; denies sx; declines cardiology evaluation at this time Disucssed risks and agreeable to proceed with medical treatment Start daily EC ASA 81 mg Control blood pressure, cholesterol (LDL <70), glucose, increase exercise.  Reviewed sx of MI in female Reminder to go to the ER if any CP, SOB, nausea, dizziness, severe fatigue, left arm numbness and tingling and jaw pain.  Pulmonary emphysema, unspecified emphysema type ([place]) By imaging, monitor, asymptomatic   Former smoker Get CXR annually; ordered  Essential hypertension Continue medications Monitor blood pressure at home; call if consistently over 130/80 Continue DASH diet.   Reminder to go to the ER if any CP, SOB, nausea, dizziness, severe HA, changes vision/speech, left arm numbness and tingling and jaw pain.  Gastroesophageal reflux disease without esophagitis Well managed on current medications Discussed diet, avoiding triggers and other lifestyle changes  Osteopenia, unspecified location Repeat DEXA 2023, declines this year Emphasized high impact/weight bearing exercise, calcium, vitamin D   Vitamin D deficiency Continue supplementation; goal of 60-100 Check vitamin D level   Major depression in remission ([place]) Improved with lexapro; uses xanax for night time panic attacks and insomnia Lifestyle discussed: diet/exerise, sleep hygiene, stress management, hydration  Insomnia, unspecified type  Reminded to limit use of benzo, <5 days/week good sleep hygiene discussed, increase day time activity  HYPERCHOLESTEROLEMIA On rosuvastatin 20 mg  daily and tolerating well  LDL goal <70 discussed with CAD on imaging Encouraged low cholesterol diet and exercise.  Check lipid panel.   Abnormal glucose Discussed diet/exercise, weight management  Check A1C      Over 40 minutes of exam, counseling, chart review and critical decision making was performed Future Appointments  Date Time Provider [place]  02/06/2022  2:30 PM Alycia Rossetti, NP [facility] None  07/01/2022 10:00 AM Unk Pinto, MD [facility] None  10/01/2022  9:00 AM Unk Pinto, MD [facility] None     Plan:   During the course of the visit the patient was educated and counseled about appropriate screening and preventive services including:   Pneumococcal vaccine  Prevnar 13 Influenza vaccine Td vaccine Screening electrocardiogram Bone densitometry screening Colorectal cancer screening Diabetes screening Glaucoma screening Nutrition counseling  Advanced directives: requested   Subjective:  Samantha [name] is a 76 y.o. female who presents for AWV and 6 month follow up. She has Hyperlipidemia; Major depression in remission ([place]); Essential hypertension; Insomnia; Esophageal reflux; Abnormal glucose; Vitamin D deficiency; Medication management; Osteopenia; Aortic atherosclerosis ([place]); Emphysema of lung ([place]); BMI 22.0-22.9, adult; B12 deficiency; Coronary artery calcification seen on CT scan; History of breast cancer; and Persistent microscopic hematuria on their problem list.  She notes 3 weeks of intermittently productive cough, started after [place] the mountains, more productive in AM then intermittently later in the day, cough worse in the evening. Has noted persistent throat clearing. Has been taking mucinex. She does note prone to allergies in the fall/winter, mild, hasn't started H2i. Denies CP, congestion, dyspnea, wheezing. Denies fever/chills.   She has remote history of breast cancer of the left in 2005 without recurrence, was  released by Dr. Jana Hakim and getting annual mammograms.   She is on lexapro  for depression; feels that she is doing well on this agent. Previously on celexa. She uses the xanax at night for insomnia, not during the day. Taking at night when she has most of her panic attacks, takes 1/2-1 depending on how bad she is doing, trying to limit, avoids taking daily.   She has ICS and takes elavil PRN, worse when stressed, sleeping away from home.   GERD controlled, reports has been taking prilosec from leftover previous script.   BMI is There is no height or weight on file to calculate BMI., she has not been working on diet and exercise. She is doing walking 30 min several days a week and also free weights. Eats lots of fruit, goes through bag of apples and oranges weekly.   Wt Readings from Last 3 Encounters:  10/24/21 124 lb (56.2 kg)  06/26/21 121 lb 9.6 oz (55.2 kg)  02/07/21 120 lb (54.4 kg)   Former smoker (estimated 14 pack/year hx); she has centrilobular emphysema per CT chest 07/2020, some fibrosis of LLL.   Today their BP is   She does workout. She denies chest pain, dyspnea, dizziness.   She has aortic atherosclerosis and CAD per CT 07/2020. Denies family hx of CAD/MI, declines cardiology referral at this time.   She is on cholesterol medication (rosuvastatin 20 mg daily) and denies myalgias. Her cholesterol is not at goal, discussed LDL <70. The cholesterol last visit was:   Lab Results  Component Value Date   CHOL 213 (H) 06/26/2021   HDL 54 06/26/2021   LDLCALC 119 (H) 06/26/2021   TRIG 283 (H) 06/26/2021   CHOLHDL 3.9 06/26/2021    She has been working on diet and exercise for glucose management, and denies foot ulcerations, increased appetite, nausea, paresthesia of the feet, polydipsia, polyuria, visual disturbances, vomiting and weight loss. Last A1C in the office was:  Lab Results  Component Value Date   HGBA1C 5.6 06/26/2021   Last GFR: Lab Results  Component Value Date    GFRNONAA 90 06/26/2020   Patient is on Vitamin D supplement, taking 3000 IU Lab Results  Component Value Date   VD25OH 53 06/26/2021     Patient has liquid B12 but admits hasn't done regularly.  Lab Results  Component Value Date   [id number] 818 06/26/2021      Medication Review: Current Outpatient Medications on File Prior to Visit  Medication Sig Dispense Refill   ALPRAZolam (XANAX) 0.5 MG tablet TAKE 1 TAB TWICE A DAY AS NEEDED FOR SEVERE ANXIETY/PANIC ATTACK. AVOID USING DAILY, USE SPARINGLY AND LIMIT TO LESS THAN 5 DAYS A WEEK AVOID HABITUATION. 50 tablet 0   amitriptyline (ELAVIL) 10 MG tablet Take 1-2 tablets (10-20 mg total) by mouth at bedtime as needed. for sleep 180 tablet 3   aspirin EC 81 MG tablet Take 81 mg by mouth daily. Swallow whole. (Patient not taking: Reported on 06/26/2021)     cholecalciferol (VITAMIN D) 1000 units tablet Take 1,000 Units by mouth.     Cyanocobalamin (B12 LIQUID HEALTH BOOSTER PO) Take 1 drop by mouth daily.     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     Magnesium 250 MG TABS Take 250 mg by mouth daily.     omeprazole (PRILOSEC) 40 MG capsule Take 1 capsule (40 mg total) by mouth as needed. Take 1 capsule Daily for Heartburn &  Indigestion 90 capsule 1   rosuvastatin (CRESTOR) 20 MG tablet TAKE 1 TABLET BY MOUTH EVERY DAY FOR CHOLESTEROL 90 tablet 3   No current facility-administered medications on file prior to visit.    Allergies  Allergen Reactions   Codeine    Decongestant [Pseudoephedrine Hcl]     Current Problems (verified) Patient Active Problem List   Diagnosis Date Noted   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   History of breast cancer 01/26/2021   Coronary artery calcification seen on CT scan 07/21/2020   BMI 22.0-22.9, adult 06/23/2020   B12 deficiency 06/23/2020   Aortic atherosclerosis ([place]) 09/23/2017    Emphysema of lung ([place]) 09/23/2017   Osteopenia 09/08/2014   Abnormal glucose 05/04/2014   Vitamin D deficiency 05/04/2014   Medication management 05/04/2014   Esophageal reflux 01/31/2011   Hyperlipidemia 08/15/2009   Major depression in remission ([place]) 08/15/2009   Essential hypertension 08/15/2009   Insomnia 08/15/2009    Health Maintenance Immunization History  Administered Date(s) Administered   Pneumococcal-Unspecified 04/08/1998   Tetanus 04/08/1997     Declines all vaccines.  Prior vaccinations: TD or Tdap: 1999 and declines           Influenza: declines Pneumococcal: 2000 declines Prevnar 13 declines Shingles/Zostavax: declines  Covid 69: declines     Last colonoscopy: 10/2012, 10 years, Dr. Deatra Ina Last mammogram 3D: 12/2019 - has scheduled 02/01/2021 Last pap smear/pelvic exam: 2011  remote, declines another  DEXA: 11/2017 - T fem -1.7 declines this year, get 2023  CXR 08/2019 former light intermittent smoker 1-2 pack/week, estimated 14 pack year history,  Ct chest 07/2020, centrilobular emphysema, left base fibrosis, aortic atheroslerosis, CAD  US soft tissue head 09/2014 - thyroid nodules not meeting criteria for follow up  Names of Other Physician/Practitioners you currently use: 1. [facility]- here for primary care 2. [facility], Dr. Herschel Sen[place], eye doctor, last visit 2021, has left cataract, monitoring only 3. Dr. Fara Boros, [facility] on [address], last visit 2022, goes [id number]. CZannie Kehr last 2022  Patient Care Team: MUnk Pinto MD as PCP - General (Internal Medicine) KInda Castle MD (Inactive) as Consulting Physician (Gastroenterology) GCrista Luria MD as Consulting Physician (Dermatology) Magrinat, GVirgie Dad MD (Inactive) as Consulting Physician (Oncology)  SURGICAL HISTORY She  has a past surgical history that includes Abdominal hysterectomy; Cholecystectomy (2004); Nasal sinus surgery; and Breast  lumpectomy (2005). FAMILY HISTORY Her family history includes Bipolar disorder in her brother; Breast cancer in her maternal aunt, maternal aunt, maternal aunt, maternal aunt, and mother; Heart attack in her maternal grandfather; Heart disease in her mother; Heart disease (age of onset: 534 in her father; Heart failure in her mother; Lung cancer in her daughter; Parkinson's disease in her sister; Rheum arthritis in her mother. SOCIAL HISTORY She  reports that she quit smoking about 9 years ago. Her smoking use included cigarettes. She has a 14.00 pack-year smoking history. She has never used smokeless tobacco. She reports current alcohol use of about 1.0 standard drink of alcohol per week. She reports that she does not use drugs.  MEDICARE WELLNESS OBJECTIVES: Physical activity:   Cardiac risk factors:   Depression/mood screen:      06/26/2021   10:39 AM  Depression screen PHQ 2/9  Decreased Interest 0  Down, Depressed, Hopeless 0  PHQ - 2 Score 0  Altered sleeping 1  Tired, decreased energy 0  Change in appetite 0  Feeling bad or failure about yourself  0  Trouble  concentrating 0  Moving slowly or fidgety/restless 0  Suicidal thoughts 0  PHQ-9 Score 1  Difficult doing work/chores Not difficult at all    ADLs:      No data to display           Cognitive Testing  Alert? Yes  Normal Appearance?Yes  Oriented to person? Yes  Place? Yes   Time? Yes  Recall of three objects?  Yes  Can perform simple calculations? Yes  Displays appropriate judgment?Yes  Can read the correct time from a watch face?Yes  EOL planning:       Review of Systems  Constitutional:  Negative for chills, fever, malaise/fatigue and weight loss.  HENT:  Negative for congestion, hearing loss, sinus pain, sore throat and tinnitus.   Eyes:  Negative for blurred vision and double vision.  Respiratory:  Positive for cough and sputum production (intermittent). Negative for shortness of breath and wheezing.    Cardiovascular:  Negative for chest pain, palpitations, orthopnea, claudication, leg swelling and PND.  Gastrointestinal:  Negative for abdominal pain, blood in stool, constipation, diarrhea, heartburn, melena, nausea and vomiting.  Genitourinary: Negative.   Musculoskeletal:  Negative for falls, joint pain and myalgias.  Skin:  Negative for rash.  Neurological:  Negative for dizziness, tingling, sensory change, weakness and headaches.  Endo/Heme/Allergies:  Positive for environmental allergies. Negative for polydipsia.  Psychiatric/Behavioral:  Negative for depression, memory loss, substance abuse and suicidal ideas. The patient is nervous/anxious and has insomnia.   All other systems reviewed and are negative.    Objective:     There were no vitals filed for this visit.  There is no height or weight on file to calculate BMI.  General appearance: alert, no distress, WD/WN, female HEENT: normocephalic, sclerae anicteric, TMs pearly, nares patent, no discharge or erythema, pharynx normal Oral cavity: MMM, no lesions Neck: supple, no lymphadenopathy, no thyromegaly, no masses Heart: RRR, normal S1, S2, no murmurs Lungs: CTA bilaterally, no wheezes, rhonchi, or rales Abdomen: +bs, soft, non tender, non distended, no masses, no hepatomegaly, no splenomegaly Musculoskeletal: nontender, no swelling, no obvious deformity Extremities: no edema, no cyanosis, no clubbing Pulses: 2+ symmetric, upper and lower extremities, normal cap refill Neurological: alert, oriented x 3, CN2-12 intact, strength normal upper extremities and lower extremities, sensation normal throughout, DTRs 2+ throughout, no cerebellar signs, gait normal Psychiatric: normal affect, behavior normal, pleasant    Medicare Attestation I have personally reviewed: The patient's medical and social history Their use of alcohol, tobacco or illicit drugs Their current medications and supplements The patient's functional ability  including ADLs,fall risks, home safety risks, cognitive, and hearing and visual impairment Diet and physical activities Evidence for depression or mood disorders  The patient's weight, height, BMI, and visual acuity have been recorded in the chart.  I have made referrals, counseling, and provided education to the patient based on review of the above and I have provided the patient with a written personalized care plan for preventive services.     Alycia Rossetti, NP   02/05/2022

## 2022-02-06 ENCOUNTER — Ambulatory Visit (INDEPENDENT_AMBULATORY_CARE_PROVIDER_SITE_OTHER): Payer: Medicare Other | Admitting: Nurse Practitioner

## 2022-02-06 ENCOUNTER — Encounter: Payer: Self-pay | Admitting: Nurse Practitioner

## 2022-02-06 VITALS — BP 118/70 | HR 84 | Temp 97.7°F | Ht 61.0 in | Wt 117.0 lb

## 2022-02-06 DIAGNOSIS — Z79899 Other long term (current) drug therapy: Secondary | ICD-10-CM

## 2022-02-06 DIAGNOSIS — Z0001 Encounter for general adult medical examination with abnormal findings: Secondary | ICD-10-CM

## 2022-02-06 DIAGNOSIS — F324 Major depressive disorder, single episode, in partial remission: Secondary | ICD-10-CM

## 2022-02-06 DIAGNOSIS — Z87891 Personal history of nicotine dependence: Secondary | ICD-10-CM | POA: Diagnosis not present

## 2022-02-06 DIAGNOSIS — M8580 Other specified disorders of bone density and structure, unspecified site: Secondary | ICD-10-CM

## 2022-02-06 DIAGNOSIS — J439 Emphysema, unspecified: Secondary | ICD-10-CM | POA: Diagnosis not present

## 2022-02-06 DIAGNOSIS — G4700 Insomnia, unspecified: Secondary | ICD-10-CM

## 2022-02-06 DIAGNOSIS — I10 Essential (primary) hypertension: Secondary | ICD-10-CM

## 2022-02-06 DIAGNOSIS — F419 Anxiety disorder, unspecified: Secondary | ICD-10-CM

## 2022-02-06 DIAGNOSIS — I2510 Atherosclerotic heart disease of native coronary artery without angina pectoris: Secondary | ICD-10-CM

## 2022-02-06 DIAGNOSIS — Z0000 Encounter for general adult medical examination without abnormal findings: Secondary | ICD-10-CM

## 2022-02-06 DIAGNOSIS — F325 Major depressive disorder, single episode, in full remission: Secondary | ICD-10-CM

## 2022-02-06 DIAGNOSIS — E785 Hyperlipidemia, unspecified: Secondary | ICD-10-CM

## 2022-02-06 DIAGNOSIS — R6889 Other general symptoms and signs: Secondary | ICD-10-CM

## 2022-02-06 DIAGNOSIS — R7309 Other abnormal glucose: Secondary | ICD-10-CM

## 2022-02-06 DIAGNOSIS — E559 Vitamin D deficiency, unspecified: Secondary | ICD-10-CM

## 2022-02-06 DIAGNOSIS — I700 Atherosclerosis of aorta: Secondary | ICD-10-CM

## 2022-02-06 MED ORDER — BUPROPION HCL ER (XL) 150 MG PO TB24: 150.0000 mg | ORAL_TABLET | ORAL | 2 refills | Status: DC

## 2022-02-06 MED ORDER — ALPRAZOLAM 0.5 MG PO TABS: ORAL_TABLET | ORAL | 0 refills | Status: DC

## 2022-02-06 NOTE — Patient Instructions (Signed)
Chest x ray is ordered at [facility] [place] in M-F 8:30-3:45  Start Wellbutrin once a day in the am, do not suddenly stop taking this medication  Bupropion Sustained-Release Tablets (Depression/Mood Disorders) What is this medication? BUPROPION (byoo PROE pee on) treats depression. It increases norepinephrine and dopamine in the brain, hormones that help regulate mood. It belongs to a group of medications called NDRIs. This medicine may be used for other purposes; ask your health care provider or pharmacist if you have questions. COMMON BRAND NAME(S): Budeprion SR, Wellbutrin SR What should I tell my care team before I take this medication? They need to know if you have any of these conditions: An eating disorder, such as anorexia or bulimia Bipolar disorder or psychosis Diabetes or high blood sugar, treated with medication Glaucoma Head injury or brain tumor Heart disease, previous heart attack, or irregular heart beat High blood pressure Kidney or liver disease Seizures Suicidal thoughts or a previous suicide attempt Tourette's syndrome Weight loss An unusual or allergic reaction to bupropion, other medications, foods, dyes, or preservatives Pregnant or trying to become pregnant Breast-feeding How should I use this medication? Take this medication by mouth with a glass of water. Follow the directions on the prescription label. You can take it with or without food. If it upsets your stomach, take it with food. Do not cut, crush or chew this medicine. Take your medication at regular intervals. If you take this medication more than once a day, take your second dose at least 8 hours after you take your first dose. To limit difficulty in sleeping, avoid taking this medication at bedtime. Do not take your medication more often than directed. Do not stop taking this medication suddenly except upon the advice of your care team. Stopping this medication too  quickly may cause serious side effects or your condition may worsen. A special MedGuide will be given to you by the pharmacist with each prescription and refill. Be sure to read this information carefully each time. Talk to your care team about the use of this medication in children. Special care may be needed. Overdosage: If you think you have taken too much of this medicine contact a poison control center or emergency room at once. NOTE: This medicine is only for you. Do not share this medicine with others. What if I miss a dose? If you miss a dose, skip the missed dose and take your next tablet at the regular time. There should be at least 8 hours between doses. Do not take double or extra doses. What may interact with this medication? Do not take this medication with any of the following: Linezolid MAOIs like Azilect, Carbex, Eldepryl, Marplan, Nardil, and Parnate Methylene blue (injected into a vein) Other medications that contain bupropion like Zyban This medication may also interact with the following: Alcohol Certain medications for anxiety or sleep Certain medications for blood pressure like metoprolol, propranolol Certain medications for depression or psychotic disturbances Certain medications for HIV or AIDS like efavirenz, lopinavir, nelfinavir, ritonavir Certain medications for irregular heart beat like propafenone, flecainide Certain medications for Parkinson's disease like amantadine, levodopa Certain medications for seizures like carbamazepine, phenytoin, phenobarbital Cimetidine Clopidogrel Cyclophosphamide Digoxin Furazolidone Isoniazid Nicotine Orphenadrine Procarbazine Steroid medications like prednisone or cortisone Stimulant medications for attention disorders, weight loss, or to stay awake Tamoxifen Theophylline Thiotepa Ticlopidine Tramadol Warfarin This list may not describe all possible interactions. Give your health care provider a list of all the  medicines, herbs,  non-prescription drugs, or dietary supplements you use. Also tell them if you smoke, drink alcohol, or use illegal drugs. Some items may interact with your medicine. What should I watch for while using this medication? Tell your care team if your symptoms do not get better or if they get worse. Visit your care team for regular checks on your progress. Because it may take several weeks to see the full effects of this medication, it is important to continue your treatment as prescribed. Watch for new or worsening thoughts of suicide or depression. This includes sudden changes in mood, behavior, or thoughts. These changes can happen at any time but are more common in the beginning of treatment or after a change in dose. Call your care team right away if you experience these thoughts or worsening depression. Manic episodes may happen in patients with bipolar disorder who take this medication. Watch for changes in feelings or behaviors such as feeling anxious, nervous, agitated, panicky, irritable, hostile, aggressive, impulsive, severely restless, overly excited and hyperactive, or trouble sleeping. These symptoms can happen at anytime but are more common in the beginning of treatment or after a change in dose. Call your care team right away if you notice any of these symptoms. This medication may cause serious skin reactions. They can happen weeks to months after starting the medication. Contact your care team right away if you notice fevers or flu-like symptoms with a rash. The rash may be red or purple and then turn into blisters or peeling of the skin. Or, you might notice a red rash with swelling of the face, lips or lymph nodes in your neck or under your arms. Avoid drinks that contain alcohol while taking this medication. Drinking large amounts of alcohol, using sleeping or anxiety medications, or quickly stopping the use of these agents while taking this medication may increase your risk  for a seizure. Do not drive or use heavy machinery until you know how this medication affects you. This medication can impair your ability to perform these tasks. Do not take this medication close to bedtime. It may prevent you from sleeping. Your mouth may get dry. Chewing sugarless gum or sucking hard candy, and drinking plenty of water may help. Contact your care team if the problem does not go away or is severe. What side effects may I notice from receiving this medication? Side effects that you should report to your care team as soon as possible: Allergic reactions--skin rash, itching, hives, swelling of the face, lips, tongue, or throat Increase in blood pressure Mood and behavior changes--anxiety, nervousness, confusion, hallucination, irritability, hostility, thoughts of suicide or self-harm, worsening mood, feelings of depression Redness, blistering, peeling, or loosening of the skin, including inside the mouth Seizures Sudden eye pain or change in vision such as blurry vision, seeing halos around lights, vision loss Side effects that usually do not require medical attention (report to your care team if they continue or are bothersome): Constipation Dizziness Dry mouth Loss of appetite Nausea Tremors or shaking Trouble sleeping This list may not describe all possible side effects. Call your doctor for medical advice about side effects. You may report side effects to FDA at 1-800-FDA-1088. Where should I keep my medication? Keep out of the reach of children and pets. Store at room temperature between 20 and 25 degrees C (68 and 77 degrees F), away from direct sunlight and moisture. Keep tightly closed. Throw away any unused medication after the expiration date. NOTE: This sheet is a  summary. It may not cover all possible information. If you have questions about this medicine, talk to your doctor, pharmacist, or health care provider.  2023 Elsevier/Gold Standard (2004-04-25  00:00:00)

## 2022-02-07 LAB — CBC WITH DIFFERENTIAL/PLATELET
Absolute Monocytes: 533 cells/uL (ref 200–950)
Basophils Absolute: 53 cells/uL (ref 0–200)
Basophils Relative: 0.7 %
Eosinophils Absolute: 53 cells/uL (ref 15–500)
Eosinophils Relative: 0.7 %
HCT: 38.2 % (ref 35.0–45.0)
Hemoglobin: 13.4 g/dL (ref 11.7–15.5)
Lymphs Abs: 2108 cells/uL (ref 850–3900)
MCH: 32.9 pg (ref 27.0–33.0)
MCHC: 35.1 g/dL (ref 32.0–36.0)
MCV: 93.9 fL (ref 80.0–100.0)
MPV: 9.8 fL (ref 7.5–12.5)
Monocytes Relative: 7.1 %
Neutro Abs: 4755 cells/uL (ref 1500–7800)
Neutrophils Relative %: 63.4 %
Platelets: 308 10*3/uL (ref 140–400)
RBC: 4.07 10*6/uL (ref 3.80–5.10)
RDW: 12.5 % (ref 11.0–15.0)
Total Lymphocyte: 28.1 %
WBC: 7.5 10*3/uL (ref 3.8–10.8)

## 2022-02-07 LAB — LIPID PANEL
Cholesterol: 179 mg/dL (ref <200)
HDL: 55 mg/dL (ref >=50)
LDL Cholesterol (Calc): 93 mg/dL (calc)
Non-HDL Cholesterol (Calc): 124 mg/dL (calc) (ref <130)
Total CHOL/HDL Ratio: 3.3 (calc) (ref <5.0)
Triglycerides: 226 mg/dL — ABNORMAL HIGH (ref <150)

## 2022-02-07 LAB — COMPLETE METABOLIC PANEL WITH GFR
AG Ratio: 1.8 (calc) (ref 1.0–2.5)
ALT: 17 U/L (ref 6–29)
AST: 19 U/L (ref 10–35)
Albumin: 4.7 g/dL (ref 3.6–5.1)
Alkaline phosphatase (APISO): 84 U/L (ref 37–153)
BUN: 9 mg/dL (ref 7–25)
CO2: 29 mmol/L (ref 20–32)
Calcium: 9.7 mg/dL (ref 8.6–10.4)
Chloride: 103 mmol/L (ref 98–110)
Creat: 0.71 mg/dL (ref 0.60–1.00)
Globulin: 2.6 g/dL (calc) (ref 1.9–3.7)
Glucose, Bld: 94 mg/dL (ref 65–99)
Potassium: 4.1 mmol/L (ref 3.5–5.3)
Sodium: 141 mmol/L (ref 135–146)
Total Bilirubin: 0.3 mg/dL (ref 0.2–1.2)
Total Protein: 7.3 g/dL (ref 6.1–8.1)
eGFR: 89 mL/min/{1.73_m2} (ref >=60)

## 2022-02-07 LAB — TSH: TSH: 3.77 mIU/L (ref 0.40–4.50)

## 2022-02-18 ENCOUNTER — Ambulatory Visit
Admission: RE | Admit: 2022-02-18 | Discharge: 2022-02-18 | Disposition: A | Discharge status: Home / Self Care | Payer: Medicare Other | Source: Ambulatory Visit | Attending: Nurse Practitioner | Admitting: Nurse Practitioner

## 2022-02-18 DIAGNOSIS — Z87891 Personal history of nicotine dependence: Secondary | ICD-10-CM

## 2022-02-19 ENCOUNTER — Other Ambulatory Visit: Payer: Self-pay | Admitting: Nurse Practitioner

## 2022-02-19 DIAGNOSIS — R9389 Abnormal findings on diagnostic imaging of other specified body structures: Secondary | ICD-10-CM

## 2022-02-19 NOTE — Progress Notes (Signed)
Lmom for patient to call back for results. -e welch

## 2022-03-02 ENCOUNTER — Other Ambulatory Visit: Payer: Self-pay | Admitting: Nurse Practitioner

## 2022-03-02 DIAGNOSIS — F324 Major depressive disorder, single episode, in partial remission: Secondary | ICD-10-CM

## 2022-03-25 ENCOUNTER — Ambulatory Visit
Admission: RE | Admit: 2022-03-25 | Discharge: 2022-03-25 | Disposition: A | Discharge status: Home / Self Care | Payer: Medicare Other | Source: Ambulatory Visit | Attending: Nurse Practitioner | Admitting: Nurse Practitioner

## 2022-03-25 DIAGNOSIS — R9389 Abnormal findings on diagnostic imaging of other specified body structures: Secondary | ICD-10-CM

## 2022-04-09 ENCOUNTER — Other Ambulatory Visit: Payer: Self-pay | Admitting: Nurse Practitioner

## 2022-04-09 DIAGNOSIS — F419 Anxiety disorder, unspecified: Secondary | ICD-10-CM

## 2022-04-09 DIAGNOSIS — G4700 Insomnia, unspecified: Secondary | ICD-10-CM

## 2022-04-22 ENCOUNTER — Other Ambulatory Visit: Payer: Self-pay

## 2022-04-22 DIAGNOSIS — F325 Major depressive disorder, single episode, in full remission: Secondary | ICD-10-CM

## 2022-04-22 MED ORDER — ESCITALOPRAM OXALATE 20 MG PO TABS: ORAL_TABLET | ORAL | 3 refills | Status: DC

## 2022-06-27 ENCOUNTER — Encounter: Payer: Medicare Other | Admitting: Adult Health

## 2022-06-30 ENCOUNTER — Encounter: Payer: Self-pay | Admitting: Internal Medicine

## 2022-06-30 NOTE — Progress Notes (Unsigned)
Future Appointments  Date Time Provider Department  07/01/2022 10:00 AM Unk Pinto, MD [facility]  10/01/2022  9:00 AM Unk Pinto, MD [facility]  07/09/2023 10:00 AM Unk Pinto, MD [facility]    Annual Screening/Preventative Visit & Comprehensive Evaluation &  Examination      This very nice 77 y.o. DWF  presents for a Screening /Preventative Visit & comprehensive evaluation and management of multiple medical co-morbidities.  Patient has been followed for labile HTN, HLD, Prediabetes  and Vitamin D Deficiency.      HTN predates since 2000. Patient's BP has been controlled at home and patient denies any cardiac symptoms as chest pain, palpitations, shortness of breath, dizziness or ankle swelling. Today's BP is at goal - 118/70 .        Patient's hyperlipidemia is controlled with diet and medications. Patient denies myalgias or other medication SE's. Last lipids were at goal except elevated Trig's:  Lab Results  Component Value Date   CHOL 179 02/06/2022   HDL 55 02/06/2022   LDLCALC 93 02/06/2022   TRIG 226 (H) 02/06/2022   CHOLHDL 3.3 02/06/2022        Patient has hx/o prediabetes (A1c 5.7% / 2013)  and patient denies reactive hypoglycemic symptoms, visual blurring, diabetic polys or paresthesias. Last A1c was Normal & at goal:  Lab Results  Component Value Date   HGBA1C 5.6 06/26/2021        Finally, patient has history of Vitamin D Deficiency ("37" in June 2019)  and last Vitamin D was slightly low (goal 70-100):  Lab Results  Component Value Date   VD25OH 53 06/26/2021     Current Outpatient Medications  Medication Instructions   ALPRAZolam (XANAX) 0.5 MG tablet TAKE 1 TAB TWICE A DAY AS NEEDED FOR SEVERE ANXIETY/PANIC ATTACK. 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)   ketoconazole 2 % cream Daily PRN   Magnesium   250 mg  Daily   Omeprazole  40 mg Take 1 capsule Daily for Heartburn & Indigestion   rosuvastatin (CRESTOR) 20 MG tablet TAKE 1 TABLET  EVERY DAY       Allergies  Allergen Reactions   Codeine    Decongestant [Pseudoephedrine Hcl]     Health Maintenance  Topic Date Due   COVID-19 Vaccine (1) Never done   DTaP/Tdap/Td (1 - Tdap) 04/09/1997   DEXA SCAN  11/19/2019   INFLUENZA VACCINE  07/07/2022 (Originally 11/06/2021)   Pneumonia Vaccine 74+ Years old (1 of 2 - PCV) 02/07/2023 (Originally 10/22/1951)   COLONOSCOPY (Pts 45-73yrs Insurance coverage will need to be confirmed)  11/05/2022   MAMMOGRAM  02/05/2023   Medicare Annual Wellness (AWV)  02/07/2023   Hepatitis C Screening  Completed   HPV VACCINES  Aged Out   Zoster Vaccines- Shingrix  Discontinued   Last Colon - 11/04/2012 - Dr Deatra Ina - Recc 10 yr f/u due July 2024  Last MGM - 12/31/2018  Past Surgical History:  Procedure Laterality Date   ABDOMINAL HYSTERECTOMY     with 1 ovary spared ? patient unsure    BREAST LUMPECTOMY  2005  left   CHOLECYSTECTOMY  2004   NASAL SINUS SURGERY     Family History  Problem Relation Age of Onset   Breast cancer Mother    Heart disease Mother    Rheum arthritis Mother    Heart failure Mother    Heart disease Father 21   Parkinson's disease Sister    Breast cancer Maternal Aunt    Breast cancer Maternal Aunt    Breast cancer Maternal Aunt    Breast cancer Maternal Aunt    Bipolar disorder Brother    Heart attack Maternal Grandfather    Lung cancer Daughter        non-smoker   Colon cancer Neg Hx    Social History   Tobacco Use   Smoking status: Former    Packs/day: 0.40    Years: 35.00    Additional pack years: 0.00    Total pack years: 14.00    Types: Cigarettes    Quit date: 03/28/2012    Years since quitting: 10.2   Smokeless  tobacco: Never  Vaping Use   Vaping Use: Never used  Substance Use Topics   Alcohol use: Yes    Alcohol/week: 1.0 standard drink of alcohol    Types: 1 Glasses of wine per week   Drug use: No    ROS Constitutional: Denies fever, chills, weight loss/gain, headaches, insomnia,  night sweats, and change in appetite. Does c/o fatigue. Eyes: Denies redness, blurred vision, diplopia, discharge, itchy, watery eyes.  ENT: Denies discharge, congestion, post nasal drip, epistaxis, sore throat, earache, hearing loss, dental pain, Tinnitus, Vertigo, Sinus pain, snoring.  Cardio: Denies chest pain, palpitations, irregular heartbeat, syncope, dyspnea, diaphoresis, orthopnea, PND, claudication, edema Respiratory: denies cough, dyspnea, DOE, pleurisy, hoarseness, laryngitis, wheezing.  Gastrointestinal: Denies dysphagia, heartburn, reflux, water brash, pain, cramps, nausea, vomiting, bloating, diarrhea, constipation, hematemesis, melena, hematochezia, jaundice, hemorrhoids Genitourinary: Denies dysuria, frequency, urgency, nocturia, hesitancy, discharge, hematuria, flank pain Breast: Breast lumps, nipple discharge, bleeding.  Musculoskeletal: Denies arthralgia, myalgia, stiffness, Jt. Swelling, pain, limp, and strain/sprain. Denies falls. Skin: Denies puritis, rash, hives, warts, acne, eczema, changing in skin lesion Neuro: No weakness, tremor, incoordination, spasms, paresthesia, pain Psychiatric: Denies confusion, memory loss, sensory loss. Denies Depression. Endocrine: Denies change in weight, skin, hair change, nocturia, and paresthesia, diabetic polys, visual blurring, hyper / hypo glycemic episodes.  Heme/Lymph: No excessive bleeding, bruising, enlarged lymph nodes.  Physical Exam  BP 118/70   Pulse 86   Temp 97.9 F (36.6 C)   Resp 16   Ht 5\' 1"  (1.549 m)   Wt 119 lb 6.4 oz (54.2 kg)   SpO2 99%   BMI 22.56 kg/m   General Appearance: Well nourished, well groomed and in no apparent  distress.  Eyes: PERRLA, EOMs, conjunctiva no swelling or erythema, normal fundi and vessels. Sinuses: No frontal/maxillary tenderness ENT/Mouth: EACs patent / TMs  nl. Nares clear without erythema, swelling, mucoid exudates. Oral hygiene is good. No erythema, swelling, or exudate. Tongue normal, non-obstructing. Tonsils not swollen or erythematous. Hearing normal.  Neck: Supple, thyroid not palpable. No bruits, nodes or JVD. Respiratory: Respiratory effort normal.  BS equal and clear bilateral without rales, rhonci, wheezing or stridor. Cardio: Heart sounds are normal with regular rate and rhythm and no murmurs, rubs or gallops. Peripheral pulses are normal and equal bilaterally without edema. No aortic or femoral bruits. Chest: symmetric with normal excursions and percussion. Breasts: Symmetric, without lumps, nipple discharge, retractions, or fibrocystic changes.  Abdomen: Flat, soft with bowel sounds  active. Nontender, no guarding, rebound, hernias, masses, or organomegaly.  Lymphatics: Non tender without lymphadenopathy.  Genitourinary:  Musculoskeletal: Full ROM all peripheral extremities, joint stability, 5/5 strength, and normal gait. Skin: Warm and dry without rashes, lesions, cyanosis, clubbing or  ecchymosis.  Neuro: Cranial nerves intact, reflexes equal bilaterally. Normal muscle tone, no cerebellar symptoms. Sensation intact.  Pysch: Alert and oriented X 3, normal affect, Insight and Judgment appropriate.   Assessment and Plan  1. Annual Preventative Screening Examination   2. Labile hypertension  - EKG 12-Lead - Urinalysis, Routine w reflex microscopic - Microalbumin / Creatinine Urine Ratio - POC Hemoccult Bld/Stl (3-Cd Home Screen); Future - CBC with Diff - COMPLETE METABOLIC PANEL WITH GFR - Magnesium  3. Hyperlipidemia, mixed  - EKG 12-Lead - Urinalysis, Routine w reflex microscopic - Lipid Profile - TSH  4. Abnormal glucose  - EKG 12-Lead - Urinalysis,  Routine w reflex microscopic - Hemoglobin A1c ([facility]) - Insulin, random  5. Vitamin D deficiency  - Vitamin D (25 hydroxy)  6. Prediabetes  - EKG 12-Lead - Urinalysis, Routine w reflex microscopic - Hemoglobin A1c ([facility]) - Insulin, random  7. Screening for colorectal cancer  - POC Hemoccult Bld/Stl   8. Screening for ischemic heart disease  - EKG 12-Lead - Urinalysis, Routine w reflex microscopic  9. FHx: heart disease  - EKG 12-Lead - Urinalysis, Routine w reflex microscopic  10. Former smoker  - EKG 12-Lead - Urinalysis, Routine w reflex microscopic  11. Aortic atherosclerosis (HCC)  - EKG 12-Lead - Urinalysis, Routine w reflex microscopic  12. Medication management  - Microalbumin / Creatinine Urine Ratio - POC Hemoccult Bld/Stl (3-Cd Home Screen); Future - CBC with Diff - COMPLETE METABOLIC PANEL WITH GFR - Magnesium - Lipid Profile - TSH - Hemoglobin A1c ([facility]) - Insulin, random - Vitamin D (25 hydroxy)        Patient was counseled in prudent diet to achieve/maintain BMI less than 25 for weight control, BP monitoring, regular exercise and medications. Discussed med's effects and SE's. Screening labs and tests as requested with regular follow-up as recommended. Over 40 minutes of exam, counseling, chart review and high complex critical decision making was performed.   Kirtland Bouchard, MD

## 2022-06-30 NOTE — Patient Instructions (Signed)

## 2022-07-01 ENCOUNTER — Ambulatory Visit (INDEPENDENT_AMBULATORY_CARE_PROVIDER_SITE_OTHER): Payer: Medicare Other | Admitting: Internal Medicine

## 2022-07-01 ENCOUNTER — Encounter: Payer: Self-pay | Admitting: Internal Medicine

## 2022-07-01 VITALS — BP 118/70 | HR 86 | Temp 97.9°F | Resp 16 | Ht 61.0 in | Wt 119.4 lb

## 2022-07-01 DIAGNOSIS — I10 Essential (primary) hypertension: Secondary | ICD-10-CM

## 2022-07-01 DIAGNOSIS — K2100 Gastro-esophageal reflux disease with esophagitis, without bleeding: Secondary | ICD-10-CM

## 2022-07-01 DIAGNOSIS — Z0000 Encounter for general adult medical examination without abnormal findings: Secondary | ICD-10-CM

## 2022-07-01 DIAGNOSIS — E559 Vitamin D deficiency, unspecified: Secondary | ICD-10-CM

## 2022-07-01 DIAGNOSIS — Z79899 Other long term (current) drug therapy: Secondary | ICD-10-CM

## 2022-07-01 DIAGNOSIS — R7309 Other abnormal glucose: Secondary | ICD-10-CM

## 2022-07-01 DIAGNOSIS — E782 Mixed hyperlipidemia: Secondary | ICD-10-CM

## 2022-07-01 DIAGNOSIS — Z0001 Encounter for general adult medical examination with abnormal findings: Secondary | ICD-10-CM

## 2022-07-01 DIAGNOSIS — Z87891 Personal history of nicotine dependence: Secondary | ICD-10-CM

## 2022-07-01 DIAGNOSIS — I700 Atherosclerosis of aorta: Secondary | ICD-10-CM

## 2022-07-01 DIAGNOSIS — Z136 Encounter for screening for cardiovascular disorders: Secondary | ICD-10-CM | POA: Diagnosis not present

## 2022-07-01 DIAGNOSIS — Z1211 Encounter for screening for malignant neoplasm of colon: Secondary | ICD-10-CM

## 2022-07-01 MED ORDER — PANTOPRAZOLE SODIUM 40 MG PO TBEC: DELAYED_RELEASE_TABLET | ORAL | 3 refills | Status: DC

## 2022-07-02 LAB — LIPID PANEL
Cholesterol: 148 mg/dL (ref <200)
HDL: 58 mg/dL (ref >=50)
LDL Cholesterol (Calc): 67 mg/dL (calc)
Non-HDL Cholesterol (Calc): 90 mg/dL (calc) (ref <130)
Total CHOL/HDL Ratio: 2.6 (calc) (ref <5.0)
Triglycerides: 157 mg/dL — ABNORMAL HIGH (ref <150)

## 2022-07-02 LAB — CBC WITH DIFFERENTIAL/PLATELET
Absolute Monocytes: 328 cells/uL (ref 200–950)
Basophils Absolute: 29 cells/uL (ref 0–200)
Basophils Relative: 0.6 %
Eosinophils Absolute: 78 cells/uL (ref 15–500)
Eosinophils Relative: 1.6 %
HCT: 36.5 % (ref 35.0–45.0)
Hemoglobin: 12.6 g/dL (ref 11.7–15.5)
Lymphs Abs: 1529 cells/uL (ref 850–3900)
MCH: 32.1 pg (ref 27.0–33.0)
MCHC: 34.5 g/dL (ref 32.0–36.0)
MCV: 92.9 fL (ref 80.0–100.0)
MPV: 9.4 fL (ref 7.5–12.5)
Monocytes Relative: 6.7 %
Neutro Abs: 2935 cells/uL (ref 1500–7800)
Neutrophils Relative %: 59.9 %
Platelets: 272 10*3/uL (ref 140–400)
RBC: 3.93 10*6/uL (ref 3.80–5.10)
RDW: 12.5 % (ref 11.0–15.0)
Total Lymphocyte: 31.2 %
WBC: 4.9 10*3/uL (ref 3.8–10.8)

## 2022-07-02 LAB — URINALYSIS, ROUTINE W REFLEX MICROSCOPIC
Bacteria, UA: NONE SEEN /HPF
Bilirubin Urine: NEGATIVE
Glucose, UA: NEGATIVE
Hyaline Cast: NONE SEEN /LPF
Ketones, ur: NEGATIVE
Leukocytes,Ua: NEGATIVE
Nitrite: NEGATIVE
Protein, ur: NEGATIVE
RBC / HPF: NONE SEEN /HPF (ref 0–2)
Specific Gravity, Urine: 1.01 (ref 1.001–1.035)
Squamous Epithelial / HPF: NONE SEEN /HPF (ref <=5)
WBC, UA: NONE SEEN /HPF (ref 0–5)
pH: 7 (ref 5.0–8.0)

## 2022-07-02 LAB — COMPLETE METABOLIC PANEL WITH GFR
AG Ratio: 2.1 (calc) (ref 1.0–2.5)
ALT: 16 U/L (ref 6–29)
AST: 16 U/L (ref 10–35)
Albumin: 4.8 g/dL (ref 3.6–5.1)
Alkaline phosphatase (APISO): 91 U/L (ref 37–153)
BUN: 13 mg/dL (ref 7–25)
CO2: 28 mmol/L (ref 20–32)
Calcium: 9 mg/dL (ref 8.6–10.4)
Chloride: 104 mmol/L (ref 98–110)
Creat: 0.67 mg/dL (ref 0.60–1.00)
Globulin: 2.3 g/dL (calc) (ref 1.9–3.7)
Glucose, Bld: 86 mg/dL (ref 65–99)
Potassium: 4 mmol/L (ref 3.5–5.3)
Sodium: 141 mmol/L (ref 135–146)
Total Bilirubin: 0.4 mg/dL (ref 0.2–1.2)
Total Protein: 7.1 g/dL (ref 6.1–8.1)
eGFR: 91 mL/min/{1.73_m2} (ref >=60)

## 2022-07-02 LAB — MICROALBUMIN / CREATININE URINE RATIO
Creatinine, Urine: 53 mg/dL (ref 20–275)
Microalb Creat Ratio: 15 mg/g creat (ref <30)
Microalb, Ur: 0.8 mg/dL

## 2022-07-02 LAB — TSH: TSH: 2.48 mIU/L (ref 0.40–4.50)

## 2022-07-02 LAB — INSULIN, RANDOM: Insulin: 10 u[IU]/mL

## 2022-07-02 LAB — HEMOGLOBIN A1C
Hgb A1c MFr Bld: 5.7 % of total Hgb — ABNORMAL HIGH (ref <5.7)
Mean Plasma Glucose: 117 mg/dL
eAG (mmol/L): 6.5 mmol/L

## 2022-07-02 LAB — MAGNESIUM: Magnesium: 2.2 mg/dL (ref 1.5–2.5)

## 2022-07-02 LAB — MICROSCOPIC MESSAGE

## 2022-07-02 LAB — VITAMIN D 25 HYDROXY (VIT D DEFICIENCY, FRACTURES): Vit D, 25-Hydroxy: 74 ng/mL (ref 30–100)

## 2022-07-03 NOTE — Progress Notes (Signed)
<><><><><><><><><><><><><><><><><><><><><><><><><><><><><><><><><> <><><><><><><><><><><><><><><><><><><><><><><><><><><><><><><><><> -   Test results slightly outside the reference range are not unusual. If there is anything important, I will review this with you,  otherwise it is considered normal test values.  If you have further questions,  please do not hesitate to contact me at the office or via My Chart.  <><><><><><><><><><><><><><><><><><><><><><><><><><><><><><><><><> <><><><><><><><><><><><><><><><><><><><><><><><><><><><><><><><><>  -  Chol = 148  & LDL Chol = 67     - Both  Excellent   - Very low risk for Heart Attack  / Stroke <><><><><><><><><><><><><><><><><><><><><><><><><><><><><><><><><> <><><><><><><><><><><><><><><><><><><><><><><><><><><><><><><><><>  - A1c up slightly AGAIN to 5.7% is borderline elevated   - in the borderline and early or pre-diabetes range which has the same                     300% increased risk for heart attack, stroke, cancer and                                              alzheimer- type vascular dementia as full blown diabetes.   But the good news is that diet, exercise with weight loss can                                                                                  cure the early diabetes at this point. <><><><><><><><><><><><><><><><><><><><><><><><><><><><><><><><><> <><><><><><><><><><><><><><><><><><><><><><><><><><><><><><><><><>  - So    - Avoid Sweets, Candy & White Stuff   - White Rice, White Potatoes, White Flour  - Breads &  Pasta <><><><><><><><><><><><><><><><><><><><><><><><><><><><><><><><><> <><><><><><><><><><><><><><><><><><><><><><><><><><><><><><><><><>  - Vitamin D = 74  - Excellent - Please keep dosing Same  ! <><><><><><><><><><><><><><><><><><><><><><><><><><><><><><><><><> <><><><><><><><><><><><><><><><><><><><><><><><><><><><><><><><><>  - All Else - CBC - Kidneys - Electrolytes - Liver - Magnesium &  Thyroid    - all  Normal / OK <><><><><><><><><><><><><><><><><><><><><><><><><><><><><><><><><> <><><><><><><><><><><><><><><><><><><><><><><><><><><><><><><><><>  -  Keep up the Great Work  !   <><><><><><><><><><><><><><><><><><><><><><><><><><><><><><><><><> <><><><><><><><><><><><><><><><><><><><><><><><><><><><><><><><><>

## 2022-07-24 ENCOUNTER — Other Ambulatory Visit: Payer: Self-pay | Admitting: Nurse Practitioner

## 2022-07-24 DIAGNOSIS — G4700 Insomnia, unspecified: Secondary | ICD-10-CM

## 2022-07-24 DIAGNOSIS — F419 Anxiety disorder, unspecified: Secondary | ICD-10-CM

## 2022-07-31 ENCOUNTER — Other Ambulatory Visit: Payer: Self-pay | Admitting: Nurse Practitioner

## 2022-07-31 DIAGNOSIS — F324 Major depressive disorder, single episode, in partial remission: Secondary | ICD-10-CM

## 2022-08-08 ENCOUNTER — Other Ambulatory Visit: Payer: Self-pay

## 2022-08-08 DIAGNOSIS — Z1211 Encounter for screening for malignant neoplasm of colon: Secondary | ICD-10-CM

## 2022-08-08 DIAGNOSIS — Z1212 Encounter for screening for malignant neoplasm of rectum: Secondary | ICD-10-CM

## 2022-08-08 LAB — POC HEMOCCULT BLD/STL (HOME/3-CARD/SCREEN)
Card #2 Fecal Occult Blod, POC: NEGATIVE
Card #3 Fecal Occult Blood, POC: NEGATIVE
Fecal Occult Blood, POC: NEGATIVE

## 2022-10-01 ENCOUNTER — Ambulatory Visit: Payer: Medicare Other | Admitting: Internal Medicine

## 2022-10-14 NOTE — Progress Notes (Unsigned)
ANNUAL WELLNESS VISIT AND FOLLOW UP  Assessment:    Annual Medicare Wellness Visit Due annually  Health maintenance reviewed Declines all vaccines Declines DeXA this year  Aortic atherosclerosis (HCC) - Per CXR 2019 Control blood pressure, cholesterol, glucose, increase exercise.   Coronary artery calcification seen on CT scan Without sig fam hx; denies sx; declines cardiology evaluation at this time Disucssed risks and agreeable to proceed with medical treatment Continue daily EC ASA 81 mg Control blood pressure, cholesterol (LDL <70), glucose, increase exercise.  Reviewed sx of MI in female Reminder to go to the ER if any CP, SOB, nausea, dizziness, severe fatigue, left arm numbness and tingling and jaw pain.  Pulmonary emphysema, unspecified emphysema type (HCC) By imaging, monitor, asymptomatic   Former smoker CT normal 2022 Chest xray ordered  Essential hypertension Controlled without medication Monitor blood pressure at home; call if consistently over 130/80 Continue DASH diet.   Reminder to go to the ER if any CP, SOB, nausea, dizziness, severe HA, changes vision/speech, left arm numbness and tingling and jaw pain.  Gastroesophageal reflux disease without esophagitis Well managed on current medications: Protonix 40 mg every day  Discussed diet, avoiding triggers and other lifestyle changes  Osteopenia, unspecified location Repeat DEXA 2023, declines this year Emphasized high impact/weight bearing exercise, calcium, vitamin D   Vitamin D deficiency Continue supplementation; goal of 60-100  Major depression in partial remission (HCC)/Anxiety Continue lexapro and Wellbutrin; uses xanax for night time panic attacks and insomnia, advised to limit Lifestyle discussed: diet/exerise, sleep hygiene, stress management, hydration  Insomnia, unspecified type/anxiety Using Elavil wih good results good sleep hygiene discussed, increase day time activity, diet and  exercise  Hyperlipidemia Was on Rosuvastatin 20 mg every day but stopped 2 weeks ago to see if she can control with diet and exercise alone.  Advised this lipid will not be a good indication since she has not been off medication only 2 weeks- if elevated will restart Rosuvastatin LDL goal <70 discussed with CAD on imaging Encouraged low cholesterol diet and exercise.  Check lipid panel., CBC, CMP  Abnormal glucose Discussed diet/exercise, weight management  Check CMP  Medication Management - Magnesium   Over 40 minutes of exam, counseling, chart review and critical decision making was performed Future Appointments  Date Time Provider Department Center  01/20/2023  9:30 AM Lucky Cowboy, MD [facility] None  04/24/2023  9:30 AM Raynelle Dick, NP [facility] None  07/30/2023 10:00 AM Lucky Cowboy, MD [facility] None  10/15/2023 10:30 AM Raynelle Dick, NP [facility] None     Plan:   During the course of the visit the patient was educated and counseled about appropriate screening and preventive services including:   Pneumococcal vaccine  Prevnar 13 Influenza vaccine Td vaccine Screening electrocardiogram Bone densitometry screening Colorectal cancer screening Diabetes screening Glaucoma screening Nutrition counseling  Advanced directives: requested   Subjective:  Samantha [name] is a 77 y.o. female who presents for AWV and 6 month follow up. She has Hyperlipidemia; Major depression in remission (HCC); Essential hypertension; Insomnia; Esophageal reflux; Abnormal glucose; Vitamin D deficiency; Medication management; Osteopenia; Aortic atherosclerosis (HCC); Emphysema of lung (HCC); BMI 22.0-22.9, adult; B12 deficiency; Coronary artery calcification seen on CT scan; History of breast cancer; and Persistent microscopic hematuria on their problem list.   She has remote history of breast cancer of the left in 2005 without recurrence, was released by Dr. Darnelle Catalan and  getting annual mammograms.  Last mammogram 02/04/22 and was negative  She  is on lexapro 20 mg , Wellbutrin 150 mg every day for depression. She does use Alprazolam 0.5 mg as needed for anxiety attacks. Last filled Alprazolam 0.5 mg #45 on 07/24/22 She is using amitriptyline 10 mg 2 tabs in the evening for sleep- insomnia is worse when away from home or increased stress.  She does have night sweats that are occurring more often. She denies hot flashes during the day. She is sleeping in a cold room.    GERD controlled with use of Protonix. Unable to wean off this medication without return of symptoms.  BMI is Body mass index is 21.69 kg/m., she has not been working on diet and exercise. She is doing walking 30 min several days a week and also free weights. Eats lots of fruit, goes through bag of apples and oranges weekly.  She is down 5 pounds in past 4 months.  Wt Readings from Last 3 Encounters:  10/15/22 114 lb 12.8 oz (52.1 kg)  07/01/22 119 lb 6.4 oz (54.2 kg)  02/06/22 117 lb (53.1 kg)   Former smoker (estimated 14 pack/year hx); she has centrilobular emphysema per CT chest 07/2020, some fibrosis of LLL.  She has aortic atherosclerosis and CAD per CT 07/2020. Denies family hx of CAD/MI, declines cardiology referral at this time.   BP is well controlled without medication. Today their BP is BP: 122/64  BP Readings from Last 3 Encounters:  10/15/22 122/64  07/01/22 118/70  02/06/22 118/70  She does workout. She denies chest pain, dyspnea, dizziness.    She was started on Rosuvastatin 20 mg daily but stopped approximately 2 weeks ago to see if she could control cholesterol on her own without medication- had no side effects Her cholesterol is at goal, LDL <70. She has been trying to limit red meat, dairy, eggs and fried foods. The cholesterol last visit was:   Lab Results  Component Value Date   CHOL 148 07/01/2022   HDL 58 07/01/2022   LDLCALC 67 07/01/2022   TRIG 157 (H) 07/01/2022    CHOLHDL 2.6 07/01/2022    She has been working on diet and exercise for glucose management, and denies foot ulcerations, increased appetite, nausea, paresthesia of the feet, polydipsia, polyuria, visual disturbances, vomiting and weight loss. Last A1C in the office was:  Lab Results  Component Value Date   HGBA1C 5.7 (H) 07/01/2022   Last GFR: Lab Results  Component Value Date   EGFR 91 07/01/2022    Patient is on Vitamin D supplement, taking 3000 IU Lab Results  Component Value Date   VD25OH 74 07/01/2022     Patient has liquid B12 but admits hasn't done regularly.  Lab Results  Component Value Date   VITAMINB12 894 06/26/2021      Medication Review: Current Outpatient Medications on File Prior to Visit  Medication Sig Dispense Refill   ALPRAZolam (XANAX) 0.5 MG tablet TAKE 1 TAB once A DAY AS NEEDED FOR SEVERE ANXIETY/PANIC ATTACK. AVOID USING DAILY, USE SPARINGLY AND LIMIT TO LESS THAN 5 DAYS A WEEK AVOID HABITUATION. 45 tablet 0   amitriptyline (ELAVIL) 10 MG tablet Take 1-2 tablets (10-20 mg total) by mouth at bedtime as needed. for sleep 180 tablet 3   aspirin EC 81 MG tablet Take 81 mg by mouth daily. Swallow whole.   PRN     buPROPion (WELLBUTRIN XL) 150 MG 24 hr tablet TAKE 1 TABLET BY MOUTH EVERY DAY IN THE MORNING 90 tablet 1   cholecalciferol (VITAMIN  D) 1000 units tablet Take 1,000 Units by mouth.     escitalopram (LEXAPRO) 20 MG tablet TAKE 1 TABLET DAILY FOR MOOD (DX: F32.5) 90 tablet 3   Magnesium 250 MG TABS Take 250 mg by mouth daily.     pantoprazole (PROTONIX) 40 MG tablet Take  1 tablet  Daily  to Prevent Heartburn &  Indigestion 90 tablet 3   rosuvastatin (CRESTOR) 20 MG tablet TAKE 1 TABLET BY MOUTH EVERY DAY FOR CHOLESTEROL (Patient not taking: Reported on 10/15/2022) 90 tablet 3   No current facility-administered medications on file prior to visit.    Allergies  Allergen Reactions   Codeine    Decongestant [Pseudoephedrine Hcl]     Current  Problems (verified) Patient Active Problem List   Diagnosis Date Noted   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   History of breast cancer 01/26/2021   Coronary artery calcification seen on CT scan 07/21/2020   BMI 22.0-22.9, adult 06/23/2020   B12 deficiency 06/23/2020   Aortic atherosclerosis (HCC) 09/23/2017   Emphysema of lung (HCC) 09/23/2017   Osteopenia 09/08/2014   Abnormal glucose 05/04/2014   Vitamin D deficiency 05/04/2014   Medication management 05/04/2014   Esophageal reflux 01/31/2011   Hyperlipidemia 08/15/2009   Major depression in remission (HCC) 08/15/2009   Essential hypertension 08/15/2009   Insomnia 08/15/2009    Health Maintenance Immunization History  Administered Date(s) Administered   Pneumococcal-Unspecified 04/08/1998   Tetanus 04/08/1997    Health Maintenance  Topic Date Due   Colonoscopy  11/05/2022   COVID-19 Vaccine (1) 10/31/2022 (Originally 10/23/1950)   Pneumonia Vaccine 75+ Years old (1 of 2 - PCV) 02/07/2023 (Originally 10/22/1951)   DEXA SCAN  10/15/2023 (Originally 11/19/2019)   INFLUENZA VACCINE  11/07/2022   MAMMOGRAM  02/05/2023   Medicare Annual Wellness (AWV)  10/15/2023   Hepatitis C Screening  Completed   HPV VACCINES  Aged Out   DTaP/Tdap/Td  Discontinued   Zoster Vaccines- Shingrix  Discontinued   DEXA 2019- osteopenia- repeat ?  CXR 08/2019 former light intermittent smoker 1-2 pack/week, estimated 14 pack year history,  Ct chest 03/25/22 1. No abnormality is identified to explain opacity in recent chest radiograph. 2. No pulmonary nodule or mass. 3. Severe emphysema. 4. Coronary artery calcifications. 5. Aortic atherosclerosis.  US soft tissue head 09/2014 - thyroid nodules not meeting criteria for follow up  Names of Other Physician/Practitioners you currently use: 1. [facility]- here for primary care 2. New eye exam scheduled for 11/2022 3. Dr. Cyndy Freeze, [facility]  on [address], last visit 08/2021, goes q54m 4. Jodelle Red, last 2022  Patient Care Team: Lucky Cowboy, MD as PCP - General (Internal Medicine) Louis Meckel, MD (Inactive) as Consulting Physician (Gastroenterology) Campbell Stall, MD as Consulting Physician (Dermatology) Magrinat, Valentino Hue, MD (Inactive) as Consulting Physician (Oncology)  SURGICAL HISTORY She  has a past surgical history that includes Abdominal hysterectomy; Cholecystectomy (2004); Nasal sinus surgery; and Breast lumpectomy (2005). FAMILY HISTORY Her family history includes Bipolar disorder in her brother; Breast cancer in her maternal aunt, maternal aunt, maternal aunt, maternal aunt, and mother; Heart attack in her maternal grandfather; Heart disease in her mother; Heart disease (age of onset: 13) in her father; Heart failure in her mother; Lung cancer in her daughter; Parkinson's disease in her sister; Rheum arthritis in her mother. SOCIAL HISTORY She  reports that she quit smoking about 10 years ago. Her smoking use included cigarettes. She has a 14.00 pack-year smoking history.  She has never used smokeless tobacco. She reports current alcohol use of about 1.0 standard drink of alcohol per week. She reports that she does not use drugs.  MEDICARE WELLNESS OBJECTIVES: Physical activity:  Walking, chair exercises and weights. Cardiac risk factors: Cardiac Risk Factors include: advanced age (>14men, >35 women);dyslipidemia;smoking/ tobacco exposure;family history of premature cardiovascular disease Depression/mood screen:      10/15/2022   10:33 AM  Depression screen PHQ 2/9  Decreased Interest 0  Down, Depressed, Hopeless 0  PHQ - 2 Score 0    ADLs:     10/15/2022   10:32 AM 06/30/2022    9:33 PM  In your present state of health, do you have any difficulty performing the following activities:  Hearing? 0 0  Vision? 0 0  Difficulty concentrating or making decisions? 0 0  Walking or climbing stairs? 0 0   Dressing or bathing? 0 0  Doing errands, shopping? 0 0     Cognitive Testing  Alert? Yes  Normal Appearance?Yes  Oriented to person? Yes  Place? Yes   Time? Yes  Recall of three objects?  Yes  Can perform simple calculations? Yes  Displays appropriate judgment?Yes  Can read the correct time from a watch face?Yes  EOL planning: Does Patient Have a Medical Advance Directive?: Yes Type of Advance Directive: Living will, Healthcare Power of Attorney Does patient want to make changes to medical advance directive?: No - Patient declined Copy of Healthcare Power of Attorney in Chart?: No - copy requested     Review of Systems  Constitutional:  Negative for chills, fever, malaise/fatigue and weight loss.  HENT:  Negative for congestion, hearing loss, sinus pain, sore throat and tinnitus.   Eyes:  Negative for blurred vision, double vision and photophobia.  Respiratory:  Negative for cough, sputum production, shortness of breath and wheezing.   Cardiovascular:  Negative for chest pain, palpitations, orthopnea, claudication, leg swelling and PND.  Gastrointestinal:  Negative for abdominal pain, blood in stool, constipation, diarrhea, heartburn, melena, nausea and vomiting.  Genitourinary: Negative.   Musculoskeletal:  Negative for falls, joint pain and myalgias.  Skin:  Negative for rash.  Neurological:  Negative for dizziness, tingling, sensory change, weakness and headaches.  Endo/Heme/Allergies:  Positive for environmental allergies. Negative for polydipsia.  Psychiatric/Behavioral:  Negative for depression, memory loss, substance abuse and suicidal ideas. The patient is nervous/anxious and has insomnia.   All other systems reviewed and are negative.    Objective:     Today's Vitals   10/15/22 1011  BP: 122/64  Pulse: (!) 103  Temp: 97.9 F (36.6 C)  SpO2: 96%  Weight: 114 lb 12.8 oz (52.1 kg)  Height: 5\' 1"  (1.549 m)    Body mass index is 21.69 kg/m.  General  appearance: alert, no distress, WD/WN, female HEENT: normocephalic, sclerae anicteric, TMs pearly, nares patent, no discharge or erythema, pharynx normal Oral cavity: MMM, no lesions Neck: supple, no lymphadenopathy, no thyromegaly, no masses Heart: RRR, normal S1, S2, no murmurs Lungs: CTA bilaterally, no wheezes, rhonchi, or rales Abdomen: +bs, soft, non tender, non distended, no masses, no hepatomegaly, no splenomegaly Musculoskeletal: nontender, no swelling, no obvious deformity Extremities: no edema, no cyanosis, no clubbing Pulses: 2+ symmetric, upper and lower extremities, normal cap refill Neurological: alert, oriented x 3, CN2-12 intact, strength normal upper extremities and lower extremities, sensation normal throughout, DTRs 2+ throughout, no cerebellar signs, gait normal Psychiatric: normal affect, behavior normal, pleasant    Medicare Attestation I have personally  reviewed: The patient's medical and social history Their use of alcohol, tobacco or illicit drugs Their current medications and supplements The patient's functional ability including ADLs,fall risks, home safety risks, cognitive, and hearing and visual impairment Diet and physical activities Evidence for depression or mood disorders  The patient's weight, height, BMI, and visual acuity have been recorded in the chart.  I have made referrals, counseling, and provided education to the patient based on review of the above and I have provided the patient with a written personalized care plan for preventive services.     Raynelle Dick, NP   10/15/2022

## 2022-10-15 ENCOUNTER — Ambulatory Visit (INDEPENDENT_AMBULATORY_CARE_PROVIDER_SITE_OTHER): Payer: Medicare Other | Admitting: Nurse Practitioner

## 2022-10-15 ENCOUNTER — Encounter: Payer: Self-pay | Admitting: Nurse Practitioner

## 2022-10-15 VITALS — BP 122/64 | HR 103 | Temp 97.9°F | Ht 61.0 in | Wt 114.8 lb

## 2022-10-15 DIAGNOSIS — K2100 Gastro-esophageal reflux disease with esophagitis, without bleeding: Secondary | ICD-10-CM

## 2022-10-15 DIAGNOSIS — I10 Essential (primary) hypertension: Secondary | ICD-10-CM | POA: Diagnosis not present

## 2022-10-15 DIAGNOSIS — F419 Anxiety disorder, unspecified: Secondary | ICD-10-CM

## 2022-10-15 DIAGNOSIS — Z0001 Encounter for general adult medical examination with abnormal findings: Secondary | ICD-10-CM

## 2022-10-15 DIAGNOSIS — G4700 Insomnia, unspecified: Secondary | ICD-10-CM

## 2022-10-15 DIAGNOSIS — I700 Atherosclerosis of aorta: Secondary | ICD-10-CM

## 2022-10-15 DIAGNOSIS — Z0000 Encounter for general adult medical examination without abnormal findings: Secondary | ICD-10-CM

## 2022-10-15 DIAGNOSIS — F324 Major depressive disorder, single episode, in partial remission: Secondary | ICD-10-CM | POA: Diagnosis not present

## 2022-10-15 DIAGNOSIS — R6889 Other general symptoms and signs: Secondary | ICD-10-CM | POA: Diagnosis not present

## 2022-10-15 DIAGNOSIS — Z87891 Personal history of nicotine dependence: Secondary | ICD-10-CM

## 2022-10-15 DIAGNOSIS — I2510 Atherosclerotic heart disease of native coronary artery without angina pectoris: Secondary | ICD-10-CM

## 2022-10-15 DIAGNOSIS — R7309 Other abnormal glucose: Secondary | ICD-10-CM

## 2022-10-15 DIAGNOSIS — E782 Mixed hyperlipidemia: Secondary | ICD-10-CM

## 2022-10-15 DIAGNOSIS — N3010 Interstitial cystitis (chronic) without hematuria: Secondary | ICD-10-CM

## 2022-10-15 DIAGNOSIS — E559 Vitamin D deficiency, unspecified: Secondary | ICD-10-CM

## 2022-10-15 DIAGNOSIS — Z79899 Other long term (current) drug therapy: Secondary | ICD-10-CM

## 2022-10-15 DIAGNOSIS — J439 Emphysema, unspecified: Secondary | ICD-10-CM

## 2022-10-15 DIAGNOSIS — F325 Major depressive disorder, single episode, in full remission: Secondary | ICD-10-CM

## 2022-10-15 LAB — CBC WITH DIFFERENTIAL/PLATELET
Absolute Monocytes: 491 cells/uL (ref 200–950)
Basophils Relative: 0.6 %
Eosinophils Absolute: 50 cells/uL (ref 15–500)
Eosinophils Relative: 0.8 %
HCT: 40.8 % (ref 35.0–45.0)
Hemoglobin: 13.7 g/dL (ref 11.7–15.5)
Lymphs Abs: 1506 cells/uL (ref 850–3900)
MCV: 92.5 fL (ref 80.0–100.0)
Monocytes Relative: 7.8 %
Neutro Abs: 4215 cells/uL (ref 1500–7800)
Platelets: 345 10*3/uL (ref 140–400)
RBC: 4.41 10*6/uL (ref 3.80–5.10)
RDW: 12.6 % (ref 11.0–15.0)
Total Lymphocyte: 23.9 %

## 2022-10-15 MED ORDER — BUPROPION HCL ER (XL) 150 MG PO TB24
ORAL_TABLET | ORAL | 1 refills | Status: DC
Start: 2022-10-15 — End: 2024-01-27

## 2022-10-15 MED ORDER — ALPRAZOLAM 0.5 MG PO TABS
ORAL_TABLET | ORAL | 0 refills | Status: DC
Start: 2022-10-15 — End: 2023-04-14

## 2022-10-15 MED ORDER — ESCITALOPRAM OXALATE 20 MG PO TABS
ORAL_TABLET | ORAL | 3 refills | Status: DC
Start: 2022-10-15 — End: 2023-11-17

## 2022-10-15 MED ORDER — AMITRIPTYLINE HCL 10 MG PO TABS: 10.0000 mg | ORAL_TABLET | Freq: Every evening | ORAL | 3 refills | Status: DC | PRN

## 2022-10-15 NOTE — Patient Instructions (Signed)

## 2022-10-16 ENCOUNTER — Other Ambulatory Visit: Payer: Self-pay | Admitting: Nurse Practitioner

## 2022-10-16 DIAGNOSIS — N1831 Chronic kidney disease, stage 3a: Secondary | ICD-10-CM

## 2022-10-16 LAB — COMPLETE METABOLIC PANEL WITH GFR
AG Ratio: 1.8 (calc) (ref 1.0–2.5)
ALT: 12 U/L (ref 6–29)
AST: 17 U/L (ref 10–35)
Albumin: 4.8 g/dL (ref 3.6–5.1)
Alkaline phosphatase (APISO): 97 U/L (ref 37–153)
BUN/Creatinine Ratio: 17 (calc) (ref 6–22)
BUN: 17 mg/dL (ref 7–25)
CO2: 30 mmol/L (ref 20–32)
Calcium: 10 mg/dL (ref 8.6–10.4)
Chloride: 102 mmol/L (ref 98–110)
Creat: 1.03 mg/dL — ABNORMAL HIGH (ref 0.60–1.00)
Globulin: 2.6 g/dL (calc) (ref 1.9–3.7)
Glucose, Bld: 82 mg/dL (ref 65–99)
Potassium: 4.8 mmol/L (ref 3.5–5.3)
Sodium: 140 mmol/L (ref 135–146)
Total Bilirubin: 0.4 mg/dL (ref 0.2–1.2)
Total Protein: 7.4 g/dL (ref 6.1–8.1)
eGFR: 57 mL/min/{1.73_m2} — ABNORMAL LOW (ref >=60)

## 2022-10-16 LAB — LIPID PANEL
Cholesterol: 277 mg/dL — ABNORMAL HIGH (ref <200)
HDL: 52 mg/dL (ref >=50)
LDL Cholesterol (Calc): 178 mg/dL (calc) — ABNORMAL HIGH
Non-HDL Cholesterol (Calc): 225 mg/dL (calc) — ABNORMAL HIGH (ref <130)
Total CHOL/HDL Ratio: 5.3 (calc) — ABNORMAL HIGH (ref <5.0)
Triglycerides: 265 mg/dL — ABNORMAL HIGH (ref <150)

## 2022-10-16 LAB — CBC WITH DIFFERENTIAL/PLATELET
Basophils Absolute: 38 cells/uL (ref 0–200)
MCH: 31.1 pg (ref 27.0–33.0)
MCHC: 33.6 g/dL (ref 32.0–36.0)
MPV: 9.4 fL (ref 7.5–12.5)
Neutrophils Relative %: 66.9 %
WBC: 6.3 10*3/uL (ref 3.8–10.8)

## 2022-10-16 LAB — MAGNESIUM: Magnesium: 2.4 mg/dL (ref 1.5–2.5)

## 2022-10-29 ENCOUNTER — Ambulatory Visit (INDEPENDENT_AMBULATORY_CARE_PROVIDER_SITE_OTHER): Payer: Medicare Other

## 2022-10-29 DIAGNOSIS — N1831 Chronic kidney disease, stage 3a: Secondary | ICD-10-CM | POA: Diagnosis not present

## 2022-10-29 NOTE — Progress Notes (Signed)
Patient presents today to have kidney functions rechecked.

## 2022-10-30 LAB — BASIC METABOLIC PANEL WITH GFR
BUN: 14 mg/dL (ref 7–25)
CO2: 31 mmol/L (ref 20–32)
Calcium: 10.3 mg/dL (ref 8.6–10.4)
Chloride: 101 mmol/L (ref 98–110)
Creat: 0.81 mg/dL (ref 0.60–1.00)
Glucose, Bld: 96 mg/dL (ref 65–99)
Potassium: 4.2 mmol/L (ref 3.5–5.3)
Sodium: 140 mmol/L (ref 135–146)
eGFR: 75 mL/min/{1.73_m2} (ref >=60)

## 2022-12-23 ENCOUNTER — Other Ambulatory Visit: Payer: Self-pay | Admitting: Internal Medicine

## 2022-12-23 DIAGNOSIS — Z1231 Encounter for screening mammogram for malignant neoplasm of breast: Secondary | ICD-10-CM

## 2022-12-31 ENCOUNTER — Other Ambulatory Visit: Payer: Self-pay | Admitting: Internal Medicine

## 2022-12-31 DIAGNOSIS — K2100 Gastro-esophageal reflux disease with esophagitis, without bleeding: Secondary | ICD-10-CM

## 2022-12-31 MED ORDER — ESOMEPRAZOLE MAGNESIUM 20 MG PO CPDR
DELAYED_RELEASE_CAPSULE | ORAL | 3 refills | Status: AC
Start: 2022-12-31 — End: ?

## 2023-01-07 ENCOUNTER — Other Ambulatory Visit: Payer: Self-pay | Admitting: Internal Medicine

## 2023-01-16 ENCOUNTER — Other Ambulatory Visit: Payer: Self-pay | Admitting: Internal Medicine

## 2023-01-16 DIAGNOSIS — K2100 Gastro-esophageal reflux disease with esophagitis, without bleeding: Secondary | ICD-10-CM

## 2023-01-16 MED ORDER — ESOMEPRAZOLE MAGNESIUM 40 MG PO CPDR
DELAYED_RELEASE_CAPSULE | ORAL | 1 refills | Status: DC
Start: 2023-01-16 — End: 2023-01-20

## 2023-01-19 ENCOUNTER — Encounter: Payer: Self-pay | Admitting: Internal Medicine

## 2023-01-19 NOTE — Progress Notes (Unsigned)
Future Appointments  Date Time Provider Department  01/20/2023                6 mo   9:30 AM Lucky Cowboy, MD [facility]  04/24/2023                  9  mo  9:30 AM Raynelle Dick, NP [facility]  07/30/2023                  cpe 10:00 AM Lucky Cowboy, MD [facility]  10/15/2023                      3 mo  ov & wellness 10:30 AM Raynelle Dick, NP [facility]    History of Present Illness:      This very nice 77 y.o. MWF presents for 6 month follow up with HTN, HLD, Pre-Diabetes and Vitamin D Deficiency.         Patient is treated for HTN (2000)  & BP has been controlled at home. Today's BP is at goal -  110/70 . Patient has had no complaints of any cardiac type chest pain, palpitations, dyspnea / orthopnea / PND, dizziness, claudication, or dependent edema.        Hyperlipidemia was controlled with diet & Rosuvastatin, but patient stopped her Rosuvastatin just before her last OV. Last Lipids were not at goal with elevated  LDL & Trig's. She subsequently restarted her meds. :  Lab Results  Component Value Date   CHOL 277 (H) 10/15/2022   HDL 52 10/15/2022   LDLCALC 178 (H) 10/15/2022   TRIG 265 (H) 10/15/2022   CHOLHDL 5.3 (H) 10/15/2022     Also, the patient has history of PreDiabetes (A1c 5.7% / 2013)  and has had no symptoms of reactive hypoglycemia, diabetic polys, paresthesias or visual blurring.  Last A1c was near goal:  Lab Results  Component Value Date   HGBA1C 5.7 (H) 07/01/2022                                                           Further, the patient also has history of Vitamin D Deficiency  ("37" / 2019)  and supplements vitamin D without any suspected side-effects. Last vitamin D was at goal:  Lab Results  Component Value Date   VD25OH 74 07/01/2022    Current Outpatient Medications on File Prior to Visit  Medication Sig   ALPRAZolam  0.5 MG tablet TAKE 1 TAB once A DAY AS NEEDED FOR SEVERE ANXIETY/PANIC ATTACK. AVOID USING DAILY, USE  SPARINGLY AND LIMIT TO LESS THAN 5 DAYS A WEEK AVOID HABITUATION.   amitriptyline  10 MG tablet Take 1-2 tablets at bedtime as needed. for sleep   aspirin EC 81 MG tablet Take  daily.    buPROPion  XL 150 MG TAKE 1 TABLET EVERY DAY    VITAMIN D 1000 units Take daily   Escitalopram  20 MG tablet TAKE 1 TABLET DAILY FOR MOOD    esomeprazole 40 MG capsule Take  1 capsule  Daily     Magnesium 250 MG TABS Take daily.     Allergies  Allergen Reactions   Codeine    Decongestant [Pseudoephedrine Hcl]     PMHx:   Past Medical History:  Diagnosis Date   Anxiety    Breast cancer (HCC)    left    Depressive disorder    Diverticulosis of colon 2007   Colonoscopy   Esophagitis, unspecified 2001   EGD   Female genital prolapse 01/26/2019   GERD (gastroesophageal reflux disease)    Hx of colonic polyps 2003   Colonoscopy    IBS (irritable bowel syndrome)    Insomnia, unspecified    Internal hemorrhoids without mention of complication 2007   Colonoscopy    Personal history of radiation therapy 04/08/2004   Pure hypercholesterolemia    Stricture and stenosis of esophagus 2001, 2007   EGD   Unspecified essential hypertension      Immunization History  Administered Date(s) Administered   Pneumococcal-23 04/08/1998   Tetanus 04/08/1997     Past Surgical History:  Procedure Laterality Date   ABDOMINAL HYSTERECTOMY     with 1 ovary spared ? patient unsure    BREAST LUMPECTOMY  2005   left   CHOLECYSTECTOMY  2004   NASAL SINUS SURGERY      FHx:    Reviewed / unchanged   SHx:    Reviewed / unchanged    Systems Review:  Constitutional: Denies fever, chills, wt changes, headaches, insomnia, fatigue, night sweats, change in appetite. Eyes: Denies redness, blurred vision, diplopia, discharge, itchy, watery eyes.  ENT: Denies discharge, congestion, post nasal drip, epistaxis, sore throat, earache, hearing loss, dental pain, tinnitus, vertigo, sinus pain, snoring.  CV: Denies  chest pain, palpitations, irregular heartbeat, syncope, dyspnea, diaphoresis, orthopnea, PND, claudication or edema. Respiratory: denies cough, dyspnea, DOE, pleurisy, hoarseness, laryngitis, wheezing.  Gastrointestinal: Denies dysphagia, odynophagia, heartburn, reflux, water brash, abdominal pain or cramps, nausea, vomiting, bloating, diarrhea, constipation, hematemesis, melena, hematochezia  or hemorrhoids. Genitourinary: Denies dysuria, frequency, urgency, nocturia, hesitancy, discharge, hematuria or flank pain. Musculoskeletal: Denies arthralgias, myalgias, stiffness, jt. swelling, pain, limping or strain/sprain.  Skin: Denies pruritus, rash, hives, warts, acne, eczema or change in skin lesion(s). Neuro: No weakness, tremor, incoordination, spasms, paresthesia or pain. Psychiatric: Denies confusion, memory loss or sensory loss. Endo: Denies change in weight, skin or hair change.  Heme/Lymph: No excessive bleeding, bruising or enlarged lymph nodes.  Physical Exam  BP 110/70   Pulse 100   Temp 97.9 F (36.6 C)   Resp 16   Ht 5\' 1"  (1.549 m)   Wt 115 lb 9.6 oz (52.4 kg)   SpO2 99%   BMI 21.84 kg/m   Appears  well nourished, well groomed  and in no distress.  Eyes: PERRLA, EOMs, conjunctiva no swelling or erythema. Sinuses: No frontal/maxillary tenderness ENT/Mouth: EAC's clear, TM's nl w/o erythema, bulging. Nares clear w/o erythema, swelling, exudates. Oropharynx clear without erythema or exudates. Oral hygiene is good. Tongue normal, non obstructing. Hearing intact.  Neck: Supple. Thyroid not palpable. Car 2+/2+ without bruits, nodes or JVD. Chest: Respirations nl with BS clear & equal w/o rales, rhonchi, wheezing or stridor.  Cor: Heart sounds normal w/ regular rate and rhythm without sig. murmurs, gallops, clicks or rubs. Peripheral pulses normal and equal  without edema.  Abdomen: Soft & bowel sounds normal. Non-tender w/o guarding, rebound, hernias, masses or organomegaly.   Lymphatics: Unremarkable.  Musculoskeletal: Full ROM all peripheral extremities, joint stability, 5/5 strength and normal gait.  Skin: Warm, dry without exposed rashes, lesions or ecchymosis apparent.  Neuro: Cranial nerves intact, reflexes equal bilaterally. Sensory-motor testing grossly intact. Tendon reflexes grossly intact.  Pysch: Alert & oriented x 3.  Insight  and judgement nl & appropriate. No ideations.  Assessment and Plan:   1. Labile hypertension  - Continue medication, monitor blood pressure at home.  - Continue DASH diet.  Reminder to go to the ER if any CP,  SOB, nausea, dizziness, severe HA, changes vision/speech.  - CBC with Differential/Platelet - COMPLETE METABOLIC PANEL WITH GFR - Magnesium - TSH   2. Hyperlipidemia, mixed  - Continue diet/meds, exercise,& lifestyle modifications.  - Continue monitor periodic cholesterol/liver & renal functions   - Lipid panel - TSH   3. Abnormal glucose  - Continue diet, exercise  - Lifestyle modifications.  - Monitor appropriate labs.  - Hemoglobin A1c - Insulin, random   4. Vitamin D deficiency  - Continue supplementation.  - VITAMIN D 25 Hydroxy   5. Medication management  - CBC with Differential/Platelet - COMPLETE METABOLIC PANEL WITH GFR - Magnesium - Lipid panel - TSH - Hemoglobin A1c - Insulin, random - VITAMIN D 25 Hydroxy         Discussed  regular exercise, BP monitoring, weight control to achieve/maintain BMI less than 25 and discussed med and SE's. Recommended labs to assess and monitor clinical status with further disposition pending results of labs.  I discussed the assessment and treatment plan with the patient. The patient was provided an opportunity to ask questions and all were answered. The patient agreed with the plan and demonstrated an understanding of the instructions.  I provided over 30 minutes of exam, counseling, chart review and  complex critical decision  making.   Marinus Maw, MD

## 2023-01-19 NOTE — Patient Instructions (Signed)

## 2023-01-20 ENCOUNTER — Encounter: Payer: Self-pay | Admitting: Internal Medicine

## 2023-01-20 ENCOUNTER — Ambulatory Visit (INDEPENDENT_AMBULATORY_CARE_PROVIDER_SITE_OTHER): Payer: Medicare Other | Admitting: Internal Medicine

## 2023-01-20 VITALS — BP 110/70 | HR 100 | Temp 97.9°F | Resp 16 | Ht 61.0 in | Wt 115.6 lb

## 2023-01-20 DIAGNOSIS — E559 Vitamin D deficiency, unspecified: Secondary | ICD-10-CM | POA: Diagnosis not present

## 2023-01-20 DIAGNOSIS — E782 Mixed hyperlipidemia: Secondary | ICD-10-CM | POA: Diagnosis not present

## 2023-01-20 DIAGNOSIS — R0989 Other specified symptoms and signs involving the circulatory and respiratory systems: Secondary | ICD-10-CM

## 2023-01-20 DIAGNOSIS — Z79899 Other long term (current) drug therapy: Secondary | ICD-10-CM

## 2023-01-20 DIAGNOSIS — R7309 Other abnormal glucose: Secondary | ICD-10-CM

## 2023-01-20 DIAGNOSIS — K2100 Gastro-esophageal reflux disease with esophagitis, without bleeding: Secondary | ICD-10-CM

## 2023-01-20 MED ORDER — ESOMEPRAZOLE MAGNESIUM 40 MG PO CPDR: DELAYED_RELEASE_CAPSULE | ORAL | 3 refills | Status: DC

## 2023-01-21 LAB — CBC WITH DIFFERENTIAL/PLATELET
Absolute Monocytes: 399 {cells}/uL (ref 200–950)
Basophils Absolute: 40 {cells}/uL (ref 0–200)
Basophils Relative: 0.7 %
Eosinophils Absolute: 91 {cells}/uL (ref 15–500)
Eosinophils Relative: 1.6 %
HCT: 42.8 % (ref 35.0–45.0)
Hemoglobin: 13.9 g/dL (ref 11.7–15.5)
Lymphs Abs: 1362 {cells}/uL (ref 850–3900)
MCH: 31.4 pg (ref 27.0–33.0)
MCHC: 32.5 g/dL (ref 32.0–36.0)
MCV: 96.8 fL (ref 80.0–100.0)
MPV: 9.4 fL (ref 7.5–12.5)
Monocytes Relative: 7 %
Neutro Abs: 3808 {cells}/uL (ref 1500–7800)
Neutrophils Relative %: 66.8 %
Platelets: 345 10*3/uL (ref 140–400)
RBC: 4.42 10*6/uL (ref 3.80–5.10)
RDW: 12.5 % (ref 11.0–15.0)
Total Lymphocyte: 23.9 %
WBC: 5.7 10*3/uL (ref 3.8–10.8)

## 2023-01-21 LAB — COMPLETE METABOLIC PANEL WITH GFR
AG Ratio: 1.6 (calc) (ref 1.0–2.5)
ALT: 11 U/L (ref 6–29)
AST: 14 U/L (ref 10–35)
Albumin: 4.7 g/dL (ref 3.6–5.1)
Alkaline phosphatase (APISO): 88 U/L (ref 37–153)
BUN: 13 mg/dL (ref 7–25)
CO2: 29 mmol/L (ref 20–32)
Calcium: 9.9 mg/dL (ref 8.6–10.4)
Chloride: 105 mmol/L (ref 98–110)
Creat: 0.8 mg/dL (ref 0.60–1.00)
Globulin: 2.9 g/dL (ref 1.9–3.7)
Glucose, Bld: 78 mg/dL (ref 65–99)
Potassium: 4.8 mmol/L (ref 3.5–5.3)
Sodium: 142 mmol/L (ref 135–146)
Total Bilirubin: 0.3 mg/dL (ref 0.2–1.2)
Total Protein: 7.6 g/dL (ref 6.1–8.1)
eGFR: 76 mL/min/{1.73_m2} (ref >=60)

## 2023-01-21 LAB — LIPID PANEL
Cholesterol: 281 mg/dL — ABNORMAL HIGH (ref <200)
HDL: 54 mg/dL (ref >=50)
LDL Cholesterol (Calc): 185 mg/dL — ABNORMAL HIGH
Non-HDL Cholesterol (Calc): 227 mg/dL — ABNORMAL HIGH (ref <130)
Total CHOL/HDL Ratio: 5.2 (calc) — ABNORMAL HIGH (ref <5.0)
Triglycerides: 227 mg/dL — ABNORMAL HIGH (ref <150)

## 2023-01-21 LAB — HEMOGLOBIN A1C
Hgb A1c MFr Bld: 5.8 %{Hb} — ABNORMAL HIGH (ref <5.7)
Mean Plasma Glucose: 120 mg/dL
eAG (mmol/L): 6.6 mmol/L

## 2023-01-21 LAB — INSULIN, RANDOM: Insulin: 39.8 u[IU]/mL — ABNORMAL HIGH

## 2023-01-21 LAB — TSH: TSH: 2.02 m[IU]/L (ref 0.40–4.50)

## 2023-01-21 LAB — VITAMIN D 25 HYDROXY (VIT D DEFICIENCY, FRACTURES): Vit D, 25-Hydroxy: 59 ng/mL (ref 30–100)

## 2023-01-21 LAB — MAGNESIUM: Magnesium: 2.6 mg/dL — ABNORMAL HIGH (ref 1.5–2.5)

## 2023-01-24 ENCOUNTER — Other Ambulatory Visit: Payer: Self-pay | Admitting: Internal Medicine

## 2023-01-24 MED ORDER — ROSUVASTATIN CALCIUM 20 MG PO TABS: ORAL_TABLET | ORAL | 3 refills | Status: DC

## 2023-01-25 ENCOUNTER — Other Ambulatory Visit: Payer: Self-pay | Admitting: Internal Medicine

## 2023-02-10 ENCOUNTER — Ambulatory Visit
Admission: RE | Admit: 2023-02-10 | Discharge: 2023-02-10 | Disposition: A | Discharge status: Home / Self Care | Payer: Medicare Other | Source: Ambulatory Visit | Attending: Internal Medicine | Admitting: Internal Medicine

## 2023-02-10 DIAGNOSIS — Z1231 Encounter for screening mammogram for malignant neoplasm of breast: Secondary | ICD-10-CM

## 2023-02-13 ENCOUNTER — Encounter: Payer: Self-pay | Admitting: Internal Medicine

## 2023-02-18 ENCOUNTER — Encounter: Payer: Self-pay | Admitting: Internal Medicine

## 2023-02-18 ENCOUNTER — Ambulatory Visit (INDEPENDENT_AMBULATORY_CARE_PROVIDER_SITE_OTHER): Payer: Medicare Other | Admitting: Internal Medicine

## 2023-02-18 VITALS — BP 110/60 | HR 99 | Temp 97.0°F | Resp 16 | Ht 61.0 in | Wt 115.4 lb

## 2023-02-18 DIAGNOSIS — J209 Acute bronchitis, unspecified: Secondary | ICD-10-CM | POA: Diagnosis not present

## 2023-02-18 DIAGNOSIS — J42 Unspecified chronic bronchitis: Secondary | ICD-10-CM | POA: Diagnosis not present

## 2023-02-18 MED ORDER — PROMETHAZINE-DM 6.25-15 MG/5ML PO SYRP: ORAL_SOLUTION | ORAL | 1 refills | Status: DC

## 2023-02-18 MED ORDER — BENZONATATE 200 MG PO CAPS: ORAL_CAPSULE | ORAL | 1 refills | Status: DC

## 2023-02-18 MED ORDER — DEXAMETHASONE 4 MG PO TABS: ORAL_TABLET | ORAL | 0 refills | Status: DC

## 2023-02-18 MED ORDER — AZITHROMYCIN 250 MG PO TABS: ORAL_TABLET | ORAL | 1 refills | Status: DC

## 2023-02-18 NOTE — Progress Notes (Signed)
Future Appointments  Date Time Provider Department  02/18/2023 11:30 AM Lucky Cowboy, MD [facility]  04/24/2023                9 mo ov -wellness  9:30 AM Raynelle Dick, NP [facility]  07/30/2023                cpe 10:00 AM Lucky Cowboy, MD [facility]  11/03/2023 10:30 AM Raynelle Dick, NP [facility]    History of Present Illness:      This very nice 77 y.o. MWF  with HTN, HLD, Pre-Diabetes and Vitamin D Deficiency  presents with c/o 3 week prodrome of upper /lower respiratory sx's with productive cough. Denies fevers, chills, ash or dyspnea. Tried OTC cough meds w/o benefit.   Current Outpatient Medications on File Prior to Visit  Medication Sig   ALPRAZolam (XANAX) 0.5 MG tablet TAKE 1 TAB once A DAY AS NEEDED FOR SEVERE ANXIETY/PANIC ATTACK   amitriptyline 10 MG tablet Take 1-2 tablets at bedtime for sleep   aspirin EC 81 MG tablet Take daily.  PRN   buPROPion XL 150 MG 24 hr tablet TAKE 1 TABLET EVERY MORNING   VITAMIN D 1000 units tablet Take 1,000 Units daily   escitalopram  20 MG tablet TAKE 1 TABLET DAILY    esomeprazole 40 MG capsule Take  1 capsule  Daily    Magnesium 250 MG TABS Take  daily.   rosuvastatin 20 MG tablet Take  1 tablet  Daily     Allergies  Allergen Reactions   Codeine    Decongestant [Pseudoephedrine Hcl]     Problem list  She has Hyperlipidemia; Major depression in remission (HCC); Essential hypertension; Insomnia; Esophageal reflux; Abnormal glucose; Vitamin D deficiency; Medication management; Osteopenia; Aortic atherosclerosis (HCC); Emphysema of lung (HCC); BMI 22.0-22.9, adult; B12 deficiency; Coronary artery calcification seen on CT scan; History of breast cancer; and Persistent microscopic hematuria on their problem list.   Observations/Objective:  BP 110/60   Pulse 99   Temp (!) 97 F (36.1 C)   Resp 16   Ht 5\' 1"  (1.549 m)   Wt 115 lb 6.4 oz (52.3 kg)   SpO2 97%   BMI 21.80 kg/m   Dry cough. No Hoarseness or  Stridor.   HEENT - WNL. Neck - supple.  Chest - Bilat scattered rales & rhonchi . No wheezes. Cor - Nl HS. RRR w/o sig MGR. PP 1(+). No edema. MS- FROM w/o deformities.  Gait Nl. Neuro -  Nl w/o focal abnormalities.   Assessment and Plan:  1. Acute exacerbation of chronic bronchitis (HCC)  Dexamethasone  4 MG tablet;  Take 1 tab 3 x day - 3 days, then 2 x day - 3 days, then 1 tab daily   Dispense: 20 tablet; Refill: 0  Benzonatate 200 MG capsule;  Take 1 perle 3 x / day to prevent cough   Dispense: 30 capsule; Refill: 1  Azithromycin  250 MG tablet;  Take 2 tablets with Food on  Day 1, then 1 tablet Daily  Dispense: 6 each; Refill: 1  Promethazine-DM 6.25-15 MG/5ML syrup;  Take 1 tsp every 4 hours if needed for cough   Dispense: 240 mL; Refill: 1   Follow Up Instructions:        I discussed the assessment and treatment plan with the patient. The patient was provided an opportunity to ask questions and all were answered. The patient agreed with the plan and demonstrated  an understanding of the instructions.       The patient was advised to call back or seek an in-person evaluation if the symptoms worsen or if the condition fails to improve as anticipated.   Marinus Maw, MD

## 2023-04-14 ENCOUNTER — Other Ambulatory Visit: Payer: Self-pay | Admitting: Nurse Practitioner

## 2023-04-14 DIAGNOSIS — G4700 Insomnia, unspecified: Secondary | ICD-10-CM

## 2023-04-14 DIAGNOSIS — F419 Anxiety disorder, unspecified: Secondary | ICD-10-CM

## 2023-04-24 ENCOUNTER — Ambulatory Visit: Payer: Medicare Other | Admitting: Nurse Practitioner

## 2023-05-01 ENCOUNTER — Ambulatory Visit: Payer: Medicare Other | Admitting: Nurse Practitioner

## 2023-05-15 ENCOUNTER — Ambulatory Visit: Payer: Medicare Other | Admitting: Nurse Practitioner

## 2023-07-09 ENCOUNTER — Ambulatory Visit (INDEPENDENT_AMBULATORY_CARE_PROVIDER_SITE_OTHER): Payer: Medicare Other | Admitting: Family Medicine

## 2023-07-09 ENCOUNTER — Encounter: Payer: Self-pay | Admitting: Family Medicine

## 2023-07-09 ENCOUNTER — Encounter: Payer: Medicare Other | Admitting: Internal Medicine

## 2023-07-09 VITALS — BP 112/66 | HR 85 | Temp 97.6°F | Ht 61.0 in | Wt 116.0 lb

## 2023-07-09 DIAGNOSIS — I10 Essential (primary) hypertension: Secondary | ICD-10-CM | POA: Diagnosis not present

## 2023-07-09 DIAGNOSIS — R7303 Prediabetes: Secondary | ICD-10-CM

## 2023-07-09 DIAGNOSIS — G4700 Insomnia, unspecified: Secondary | ICD-10-CM

## 2023-07-09 DIAGNOSIS — I2510 Atherosclerotic heart disease of native coronary artery without angina pectoris: Secondary | ICD-10-CM | POA: Diagnosis not present

## 2023-07-09 DIAGNOSIS — Z7689 Persons encountering health services in other specified circumstances: Secondary | ICD-10-CM

## 2023-07-09 DIAGNOSIS — E785 Hyperlipidemia, unspecified: Secondary | ICD-10-CM

## 2023-07-09 DIAGNOSIS — I700 Atherosclerosis of aorta: Secondary | ICD-10-CM

## 2023-07-09 DIAGNOSIS — F324 Major depressive disorder, single episode, in partial remission: Secondary | ICD-10-CM

## 2023-07-09 DIAGNOSIS — F419 Anxiety disorder, unspecified: Secondary | ICD-10-CM

## 2023-07-09 MED ORDER — ALPRAZOLAM 0.5 MG PO TABS
0.5000 mg | ORAL_TABLET | Freq: Every day | ORAL | 0 refills | Status: AC | PRN
Start: 2023-07-09 — End: ?

## 2023-07-09 MED ORDER — ROSUVASTATIN CALCIUM 20 MG PO TABS: ORAL_TABLET | ORAL | 1 refills | Status: DC

## 2023-07-09 NOTE — Patient Instructions (Signed)
 Please restart your cholesterol medication.  Work on trying yourself to go to sleep once you go to bed.  Work on distraction techniques to better manage your anxiety.  I will see you back for a fasting complete physical exam in 6 to 8 weeks.

## 2023-07-09 NOTE — Progress Notes (Signed)
 New Patient Office Visit  Subjective    Patient ID: Samantha [name], female    DOB: [birth year]  Age: 78 y.o. MRN: [id number]  CC:  Chief Complaint  Patient presents with   Establish Care    HPI [name] presents to establish care Previous PCP Dr. Oneta Rack  Other providers: [facility] for [facility]acts   Annual mammograms.  Family hx of breast cancer   States she has been taking alprazolam for 20+ years  Takes it approximately 2 times per week for severe anxiety. States she is not in counseling and is not interested   Trouble falling asleep. Takes amitriptyline prn   GERD - Nexium   States she has not been taking statin.   Lives alone   Eats a lot of fruit, dark chocolate, and ice cream.   Stopped smoking 2-3 years ago  Drinks alcohol occ      07/09/2023   10:06 AM 02/18/2023   12:14 AM 01/19/2023    9:13 PM 10/15/2022   10:33 AM 06/30/2022    9:33 PM  Depression screen PHQ 2/9  Decreased Interest 1 0 0 0 0  Down, Depressed, Hopeless 1 0 0 0 0  PHQ - 2 Score 2 0 0 0 0  Altered sleeping 3      Tired, decreased energy 1      Change in appetite 1      Feeling bad or failure about yourself  1      Trouble concentrating 1      Moving slowly or fidgety/restless 0      Suicidal thoughts 0      PHQ-9 Score 9      Difficult doing work/chores Not difficult at all            Outpatient Encounter Medications as of 07/09/2023  Medication Sig   ALPRAZolam (XANAX) 0.5 MG tablet Take 1 tablet (0.5 mg total) by mouth daily as needed for anxiety (for severe anxiety).   amitriptyline (ELAVIL) 10 MG tablet Take 1-2 tablets (10-20 mg total) by mouth at bedtime as needed. for sleep   aspirin EC 81 MG tablet Take 81 mg by mouth daily. Swallow whole.   PRN   buPROPion (WELLBUTRIN XL) 150 MG 24 hr tablet TAKE 1 TABLET BY MOUTH EVERY DAY IN THE MORNING   cholecalciferol (VITAMIN D) 1000 units tablet Take 1,000 Units by mouth.   escitalopram (LEXAPRO) 20 MG tablet  TAKE 1 TABLET DAILY FOR MOOD (DX: F32.5)   esomeprazole (NEXIUM) 40 MG capsule Take  1 capsule  Daily  to Prevent Heartburn & Indigestion                                            /                                                                   TAKE                                         BY  MOUTH   Magnesium 250 MG TABS Take 250 mg by mouth daily.   [DISCONTINUED] ALPRAZolam (XANAX) 0.5 MG tablet TAKE 1 TAB ONCE A DAY AS NEEDED FOR SEVERE ANXIETY/PANIC ATTACK. AVOID USING DAILY, USE SPARINGLY AND LIMIT TO LESS THAN 5 DAYS A WEEK AVOID HABITUATION.   rosuvastatin (CRESTOR) 20 MG tablet Take  1 tablet  Daily for Cholesterol                                 /                                                                   TAKE                                         BY                                                 MOUTH   [DISCONTINUED] azithromycin (ZITHROMAX) 250 MG tablet Take 2 tablets with Food on  Day 1, then 1 tablet Daily with Food for Sinusitis / Bronchitis   [DISCONTINUED] benzonatate (TESSALON) 200 MG capsule Take 1 perle 3 x / day to prevent cough   [DISCONTINUED] dexamethasone (DECADRON) 4 MG tablet Take 1 tab 3 x day - 3 days, then 2 x day - 3 days, then 1 tab daily (Patient not taking: Reported on 07/09/2023)   [DISCONTINUED] promethazine-dextromethorphan (PROMETHAZINE-DM) 6.25-15 MG/5ML syrup Take 1 tsp every 4 hours if needed for cough   [DISCONTINUED] rosuvastatin (CRESTOR) 20 MG tablet Take  1 tablet  Daily for Cholesterol                                 /                                                                   TAKE                                         BY                                                 MOUTH (Patient not taking: Reported on 07/09/2023)   No facility-administered encounter medications on file as of 07/09/2023.    Past Medical History:  Diagnosis Date   Anxiety    Breast cancer (HCC)    left    Depressive  disorder, not elsewhere classified    Diverticulosis of colon (without mention of hemorrhage) 2007   Colonoscopy   Esophagitis, unspecified 2001   EGD   Female genital prolapse 01/26/2019   GERD (gastroesophageal reflux disease)    Hx of colonic polyps 2003   Colonoscopy    IBS (irritable bowel syndrome)    Insomnia, unspecified    Internal hemorrhoids without mention of complication 2007   Colonoscopy    Personal history of radiation therapy 04/08/2004   Pure hypercholesterolemia    Stricture and stenosis of esophagus 2001, 2007   EGD   Unspecified essential hypertension     Past Surgical History:  Procedure Laterality Date   ABDOMINAL HYSTERECTOMY     with 1 ovary spared ? patient unsure    BREAST LUMPECTOMY  2005   left   CHOLECYSTECTOMY  2004   NASAL SINUS SURGERY      Family History  Problem Relation Age of Onset   Breast cancer Mother 17   Heart disease Mother    Rheum arthritis Mother    Heart failure Mother    Heart disease Father 8   Parkinson's disease Sister    Lung cancer Daughter        non-smoker   Breast cancer Maternal Aunt 27 - 79   Breast cancer Maternal Aunt 31 - 89   Breast cancer Maternal Aunt 68 - 89   Breast cancer Maternal Aunt 80 - 89   Heart attack Maternal Grandfather    Bipolar disorder Brother    Colon cancer Neg Hx     Social History   Socioeconomic History   Marital status: Divorced    Spouse name: Not on file   Number of children: 1   Years of education: Not on file   Highest education level: 12th grade  Occupational History   Occupation: Retired  Tobacco Use   Smoking status: Former    Current packs/day: 0.00    Average packs/day: 0.4 packs/day for 35.0 years (14.0 ttl pk-yrs)    Types: Cigarettes    Start date: 03/28/1977    Quit date: 03/28/2012    Years since quitting: 11.2   Smokeless tobacco: Never  Vaping Use   Vaping status: Never Used  Substance and Sexual Activity   Alcohol use: Yes    Alcohol/week: 1.0  standard drink of alcohol    Types: 1 Glasses of wine per week   Drug use: No   Sexual activity: Not Currently    Partners: Male    Birth control/protection: Post-menopausal  Other Topics Concern   Not on file  Social History Narrative   0 caffeine drinks daily    Social Drivers of Health   Financial Resource Strain: Low Risk  (07/06/2023)   Overall Financial Resource Strain (CARDIA)    Difficulty of Paying Living Expenses: Not hard at all  Food Insecurity: No Food Insecurity (07/06/2023)   Hunger Vital Sign    Worried About Running Out of Food in the Last Year: Never true    Ran Out of Food in the Last Year: Never true  Transportation Needs: No Transportation Needs (07/06/2023)   PRAPARE - Administrator, Civil Service (Medical): No    Lack of Transportation (Non-Medical): No  Physical Activity: Insufficiently Active (07/06/2023)   Exercise Vital Sign    Days of Exercise per Week: 2 days    Minutes of Exercise per Session: 20 min  Stress: Stress Concern Present (07/06/2023)   [employer] of  Occupational Health - Occupational Stress Questionnaire    Feeling of Stress : Very much  Social Connections: Socially Isolated (07/06/2023)   Social Connection and Isolation Panel [NHANES]    Frequency of Communication with Friends and Family: More than three times a week    Frequency of Social Gatherings with Friends and Family: Three times a week    Attends Religious Services: Never    Active Member of Clubs or Organizations: No    Attends Engineer, structural: Not on file    Marital Status: Divorced  Intimate Partner Violence: Not on file    Review of Systems  Constitutional:  Negative for chills, fever and malaise/fatigue.  Respiratory:  Negative for shortness of breath.   Cardiovascular:  Negative for chest pain, palpitations and leg swelling.  Gastrointestinal:  Negative for abdominal pain, blood in stool, constipation, diarrhea, nausea and vomiting.   Genitourinary:  Negative for dysuria, frequency and urgency.  Musculoskeletal:  Negative for falls, joint pain and myalgias.  Neurological:  Negative for dizziness, focal weakness and headaches.  Psychiatric/Behavioral:  Positive for depression. Negative for substance abuse and suicidal ideas. The patient is nervous/anxious and has insomnia.         Objective    BP 112/66 (BP Location: Left Arm, Patient Position: Sitting)   Pulse 85   Temp 97.6 F (36.4 C) (Temporal)   Ht 5\' 1"  (1.549 m)   Wt 116 lb (52.6 kg)   SpO2 99%   BMI 21.92 kg/m   Physical Exam Constitutional:      General: She is not in acute distress.    Appearance: She is not ill-appearing.  Eyes:     Extraocular Movements: Extraocular movements intact.     Conjunctiva/sclera: Conjunctivae normal.  Cardiovascular:     Rate and Rhythm: Normal rate.  Pulmonary:     Effort: Pulmonary effort is normal.  Musculoskeletal:     Cervical back: Normal range of motion and neck supple.  Skin:    General: Skin is warm and dry.  Neurological:     General: No focal deficit present.     Mental Status: She is alert and oriented to person, place, and time.  Psychiatric:        Mood and Affect: Mood normal.        Behavior: Behavior normal.        Thought Content: Thought content normal.         Assessment & Plan:   Problem List Items Addressed This Visit     Aortic atherosclerosis (HCC)   Relevant Medications   rosuvastatin (CRESTOR) 20 MG tablet   Coronary artery calcification seen on CT scan   Relevant Medications   rosuvastatin (CRESTOR) 20 MG tablet   Essential hypertension - Primary   Relevant Medications   rosuvastatin (CRESTOR) 20 MG tablet   Hyperlipidemia   Relevant Medications   rosuvastatin (CRESTOR) 20 MG tablet   Insomnia   Other Visit Diagnoses       Encounter to establish care         Prediabetes         Anxiety       Relevant Medications   ALPRAZolam (XANAX) 0.5 MG tablet     Major  depressive disorder in partial remission, unspecified whether recurrent (HCC)       Relevant Medications   ALPRAZolam (XANAX) 0.5 MG tablet      She is a pleasant 78 yo female here to establish care.  Reviewed previous office  visits and labs.  Good sleep hygiene counseling done.  HTN- controlled off medication   HLD- recommend restarting statin. Discussed potential health consequences associated with uncontrolled HLD Anxiety- counseling on managing anxiety. Declines counseling. Refill alprazolam. She takes this approximatelly 2x/wk. Takes Wellbutrin and Lexapro for depression and anxiety.  She will follow up for fasting CPE  Visit time 35 minutes in face to face communication with patient and coordination of care, additional 10 minutes spent in record review, coordination or care, ordering tests, communicating/referring to other healthcare professionals, documenting in medical records all on the same day of the visit for total time 45 minutes spent on the visit.     Return for fasting CPE in 6-8 weeks .   Hetty Blend, NP-C

## 2023-07-15 ENCOUNTER — Telehealth: Payer: Self-pay | Admitting: Family Medicine

## 2023-07-15 NOTE — Telephone Encounter (Signed)
 Copied from CRM [id number]. Topic: Clinical - Prescription Issue >> Jul 15, 2023  9:39 AM Patsy Lager T wrote: Reason for CRM: patient called stated pharmacy is needing more info before they would be able to fill the script for rosuvastatin (CRESTOR) 20 MG tablet. Please f/u with [facility]

## 2023-07-15 NOTE — Telephone Encounter (Signed)
 Called and spoke w pharmacy, states they dont need anything. Called and LM for pt prescription is good to go

## 2023-07-30 ENCOUNTER — Encounter: Payer: Medicare Other | Admitting: Internal Medicine

## 2023-08-08 ENCOUNTER — Ambulatory Visit (INDEPENDENT_AMBULATORY_CARE_PROVIDER_SITE_OTHER)

## 2023-08-08 VITALS — Ht 61.0 in | Wt 116.0 lb

## 2023-08-08 DIAGNOSIS — Z0000 Encounter for general adult medical examination without abnormal findings: Secondary | ICD-10-CM | POA: Diagnosis not present

## 2023-08-08 DIAGNOSIS — Z1231 Encounter for screening mammogram for malignant neoplasm of breast: Secondary | ICD-10-CM

## 2023-08-08 NOTE — Patient Instructions (Addendum)
 Ms. Hotham , Thank you for taking time to come for your Medicare Wellness Visit. I appreciate your ongoing commitment to your health goals. Please review the following plan we discussed and let me know if I can assist you in the future.   Referrals/Orders/Follow-Ups/Clinician Recommendations: Aim for 30 minutes of exercise or brisk walking, 6-8 glasses of water, and 5 servings of fruits and vegetables each day. Educated and advised on getting the Tdap (Tetenus), Pneumonia, and COVID vaccines in 2025 at local pharmacy.    This is a list of the screening recommended for you and due dates:  Health Maintenance  Topic Date Due   COVID-19 Vaccine (1) Never done   Pneumonia Vaccine (1 of 2 - PCV) 10/22/1965   DEXA scan (bone density measurement)  10/15/2023*   Flu Shot  11/07/2023   Mammogram  02/10/2024   Medicare Annual Wellness Visit  08/07/2024   Hepatitis C Screening  Completed   HPV Vaccine  Aged Out   Meningitis B Vaccine  Aged Out   DTaP/Tdap/Td vaccine  Discontinued   Colon Cancer Screening  Discontinued   Zoster (Shingles) Vaccine  Discontinued  *Topic was postponed. The date shown is not the original due date.    Advanced directives: (Provided) Advance directive discussed with you today. I have provided a copy for you to complete at home and have notarized. Once this is complete, please bring a copy in to our office so we can scan it into your chart.   Next Medicare Annual Wellness Visit scheduled for next year: Yes

## 2023-08-08 NOTE — Progress Notes (Signed)
 Subjective:   Samantha [name] is a 78 y.o. who presents for a Medicare Wellness preventive visit.  Visit Complete: Virtual I connected with  Wilmer Hash on 08/08/23 by a audio enabled telemedicine application and verified that I am speaking with the correct person using two identifiers.  Patient Location: Home  Provider Location: Office/Clinic  I discussed the limitations of evaluation and management by telemedicine. The patient expressed understanding and agreed to proceed.  Vital Signs: Because this visit was a virtual/telehealth visit, some criteria may be missing or patient reported. Any vitals not documented were not able to be obtained and vitals that have been documented are patient reported.  VideoDeclined- This patient declined Librarian, academic. Therefore the visit was completed with audio only.  Persons Participating in Visit: Patient.  AWV Questionnaire: Yes: Patient Medicare AWV questionnaire was completed by the patient on 08/04/2023; I have confirmed that all information answered by patient is correct and no changes since this date.  Cardiac Risk Factors include: dyslipidemia;hypertension;advanced age (>4men, >55 women)     Objective:    Today's Vitals   08/08/23 0932  Weight: 116 lb (52.6 kg)  Height: 5\' 1"  (1.549 m)   Body mass index is 21.92 kg/m.     08/08/2023    9:30 AM 10/15/2022   10:32 AM 02/06/2022    2:40 PM 01/30/2021   12:14 PM 08/05/2019    3:23 PM 10/06/2018   11:16 AM 09/24/2017    2:15 PM  Advanced Directives  Does Patient Have a Medical Advance Directive? Yes Yes Yes Yes Yes Yes Yes  Type of Estate agent of [place];Living will Living will;Healthcare Power of [employer] Power of [place];Living will Healthcare Power of [place];Living will Healthcare Power of [place];Living will Healthcare Power of [place];Living will Living will;Healthcare Power of Attorney  Does patient  want to make changes to medical advance directive?  No - Patient declined No - Patient declined No - Patient declined No - Patient declined No - Patient declined No - Patient declined  Copy of Healthcare Power of Attorney in Chart? No - copy requested No - copy requested No - copy requested No - copy requested No - copy requested No - copy requested No - copy requested    Current Medications (verified) Outpatient Encounter Medications as of 08/08/2023  Medication Sig   ALPRAZolam  (XANAX ) 0.5 MG tablet Take 1 tablet (0.5 mg total) by mouth daily as needed for anxiety (for severe anxiety).   amitriptyline  (ELAVIL ) 10 MG tablet Take 1-2 tablets (10-20 mg total) by mouth at bedtime as needed. for sleep   aspirin EC 81 MG tablet Take 81 mg by mouth daily. Swallow whole.   PRN   buPROPion  (WELLBUTRIN  XL) 150 MG 24 hr tablet TAKE 1 TABLET BY MOUTH EVERY DAY IN THE MORNING   cholecalciferol (VITAMIN D ) 1000 units tablet Take 1,000 Units by mouth.   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   rosuvastatin  (CRESTOR ) 20 MG tablet Take  1 tablet  Daily for Cholesterol                                 /  TAKE                                         BY                                                 MOUTH   esomeprazole  (NEXIUM ) 40 MG capsule Take  1 capsule  Daily  to Prevent Heartburn & Indigestion                                            /                                                                   TAKE                                         BY                                                 MOUTH (Patient not taking: Reported on 08/08/2023)   No facility-administered encounter medications on file as of 08/08/2023.    Allergies (verified) Codeine and Decongestant [pseudoephedrine hcl]   History: Past Medical History:  Diagnosis Date   Anxiety     Breast cancer (HCC)    left    Depressive disorder, not elsewhere classified    Diverticulosis of colon (without mention of hemorrhage) 2007   Colonoscopy   Esophagitis, unspecified 2001   EGD   Female genital prolapse 01/26/2019   GERD (gastroesophageal reflux disease)    Hx of colonic polyps 2003   Colonoscopy    IBS (irritable bowel syndrome)    Insomnia, unspecified    Internal hemorrhoids without mention of complication 2007   Colonoscopy    Personal history of radiation therapy 04/08/2004   Pure hypercholesterolemia    Stricture and stenosis of esophagus 2001, 2007   EGD   Unspecified essential hypertension    Past Surgical History:  Procedure Laterality Date   ABDOMINAL HYSTERECTOMY     with 1 ovary spared ? patient unsure    BREAST LUMPECTOMY  2005   left   CHOLECYSTECTOMY  2004   NASAL SINUS SURGERY     Family History  Problem Relation Age of Onset   Breast cancer Mother 80   Heart disease Mother    Rheum arthritis Mother    Heart failure Mother    Heart disease Father 53   Parkinson's disease Sister    Lung cancer Daughter        non-smoker   Breast cancer Maternal Aunt 90 - 79   Breast cancer Maternal Aunt 55 - 89   Breast cancer Maternal Aunt  80 - 89   Breast cancer Maternal Aunt 80 - 89   Heart attack Maternal Grandfather    Bipolar disorder Brother    Colon cancer Neg Hx    Social History   Socioeconomic History   Marital status: Divorced    Spouse name: Not on file   Number of children: 1   Years of education: Not on file   Highest education level: 12th grade  Occupational History   Occupation: Retired  Tobacco Use   Smoking status: Former    Current packs/day: 0.00    Average packs/day: 0.4 packs/day for 35.0 years (14.0 ttl pk-yrs)    Types: Cigarettes    Start date: 03/28/1977    Quit date: 03/28/2012    Years since quitting: 11.3    Passive exposure: Past   Smokeless tobacco: Never  Vaping Use   Vaping status: Never Used   Substance and Sexual Activity   Alcohol use: Yes    Alcohol/week: 2.0 standard drinks of alcohol    Types: 1 Glasses of wine, 1 Cans of beer per week    Comment: 1 drink a week   Drug use: No   Sexual activity: Not Currently    Partners: Male    Birth control/protection: Post-menopausal  Other Topics Concern   Not on file  Social History Narrative   0 caffeine drinks daily    Social Drivers of Health   Financial Resource Strain: Low Risk  (08/08/2023)   Overall Financial Resource Strain (CARDIA)    Difficulty of Paying Living Expenses: Not hard at all  Food Insecurity: No Food Insecurity (08/08/2023)   Hunger Vital Sign    Worried About Running Out of Food in the Last Year: Never true    Ran Out of Food in the Last Year: Never true  Transportation Needs: No Transportation Needs (08/08/2023)   PRAPARE - Administrator, Civil Service (Medical): No    Lack of Transportation (Non-Medical): No  Physical Activity: Insufficiently Active (08/08/2023)   Exercise Vital Sign    Days of Exercise per Week: 1 day    Minutes of Exercise per Session: 10 min  Stress: Stress Concern Present (08/08/2023)   [employer] of Occupational Health - Occupational Stress Questionnaire    Feeling of Stress : To some extent  Social Connections: Moderately Integrated (08/08/2023)   Social Connection and Isolation Panel [NHANES]    Frequency of Communication with Friends and Family: More than three times a week    Frequency of Social Gatherings with Friends and Family: More than three times a week    Attends Religious Services: More than 4 times per year    Active Member of [employer] or Organizations: Yes    Attends Engineer, structural: More than 4 times per year    Marital Status: Divorced  Recent Concern: Social Connections - Socially Isolated (07/06/2023)   Social Connection and Isolation Panel [NHANES]    Frequency of Communication with Friends and Family: More than three times a week     Frequency of Social Gatherings with Friends and Family: Three times a week    Attends Religious Services: Never    Active Member of Clubs or Organizations: No    Attends Engineer, structural: Not on file    Marital Status: Divorced    Tobacco Counseling Counseling given: Not Answered    Clinical Intake:  Pre-visit preparation completed: Yes  Pain : No/denies pain     BMI - recorded: 21.92  Nutritional Risks: None Diabetes: No  Lab Results  Component Value Date   HGBA1C 5.8 (H) 01/20/2023   HGBA1C 5.7 (H) 07/01/2022   HGBA1C 5.6 06/26/2021     How often do you need to have someone help you when you read instructions, pamphlets, or other written materials from your doctor or pharmacy?: 1 - Never  Interpreter Needed?: No  Information entered by :: Kandy Orris, CMA   Activities of Daily Living     08/08/2023    9:35 AM 08/04/2023   12:03 PM  In your present state of health, do you have any difficulty performing the following activities:  Hearing? 0 0  Vision? 0 0  Difficulty concentrating or making decisions? 0 0  Walking or climbing stairs? 0 0  Dressing or bathing? 0 0  Doing errands, shopping? 0 0  Preparing Food and eating ? N N  Using the Toilet? N N  In the past six months, have you accidently leaked urine? N N  Do you have problems with loss of bowel control? N N  Managing your Medications? N N  Managing your Finances? N N  Housekeeping or managing your Housekeeping? N N    Patient Care Team: Abram Abraham, NP-C as PCP - General (Family Medicine) Claudette Cue, MD (Inactive) as Consulting Physician (Gastroenterology) Wolm Hawthorne, MD as Consulting Physician (Dermatology) Magrinat, Rozella Cornfield, MD (Inactive) as Consulting Physician (Oncology)  Indicate any recent Medical Services you may have received from other than [facility] providers in the past year (date may be approximate).     Assessment:   This is a routine wellness examination for  Delayla.  Hearing/Vision screen Hearing Screening - Comments:: Denies hearing difficulties   Vision Screening - Comments:: Denies vision concerns - does see an Ophthalmologist in [place], [place]   Goals Addressed               This Visit's Progress     Patient Stated (pt-stated)        Patient stated she plans to continue exercising and do more walking.       Depression Screen     08/08/2023    9:39 AM 07/09/2023   10:06 AM 02/18/2023   12:14 AM 01/19/2023    9:13 PM 10/15/2022   10:33 AM 06/30/2022    9:33 PM 02/06/2022    2:44 PM  PHQ 2/9 Scores  PHQ - 2 Score 1 2 0 0 0 0 1  PHQ- 9 Score 2 9         Fall Risk     08/08/2023    9:36 AM 08/04/2023   12:03 PM 07/09/2023   10:06 AM 02/18/2023   12:14 AM 01/19/2023    9:12 PM  Fall Risk   Falls in the past year? 0 0 0 0 0  Number falls in past yr: 0 0 0    Injury with Fall? 0 0 0    Risk for fall due to : No Fall Risks  No Fall Risks No Fall Risks No Fall Risks  Follow up Falls prevention discussed;Falls evaluation completed  Falls evaluation completed Falls prevention discussed;Education provided;Falls evaluation completed Falls prevention discussed;Education provided;Falls evaluation completed    MEDICARE RISK AT HOME:  Medicare Risk at Home Any stairs in or around the home?: No If so, are there any without handrails?: No Home free of loose throw rugs in walkways, pet beds, electrical cords, etc?: Yes Adequate lighting in your home to reduce risk of  falls?: Yes Life alert?: No Use of a cane, walker or w/c?: No Grab bars in the bathroom?: No Shower chair or bench in shower?: No Elevated toilet seat or a handicapped toilet?: No  TIMED UP AND GO:  Was the test performed?  No  Cognitive Function: 6CIT completed        08/08/2023    9:48 AM  6CIT Screen  What Year? 0 points  What month? 0 points  What time? 0 points  Count back from 20 0 points  Months in reverse 0 points  Repeat phrase 0 points  Total Score  0 points    Immunizations Immunization History  Administered Date(s) Administered   Pneumococcal-Unspecified 04/08/1998   Tetanus 04/08/1997    Screening Tests Health Maintenance  Topic Date Due   COVID-19 Vaccine (1) Never done   Pneumonia Vaccine 39+ Years old (1 of 2 - PCV) 10[birth year]22/1965   DEXA SCAN  10/15/2023 (Originally 11/19/2019)   INFLUENZA VACCINE  11/07/2023   MAMMOGRAM  02/10/2024   Medicare Annual Wellness (AWV)  08/07/2024   Hepatitis C Screening  Completed   HPV VACCINES  Aged Out   Meningococcal B Vaccine  Aged Out   DTaP/Tdap/Td  Discontinued   Colonoscopy  Discontinued   Zoster Vaccines- Shingrix  Discontinued    Health Maintenance  Health Maintenance Due  Topic Date Due   COVID-19 Vaccine (1) Never done   Pneumonia Vaccine 8+ Years old (1 of 2 - PCV) 10/22/1965   Health Maintenance Items Addressed:  Mammogram ordered  Additional Screening:  Vision Screening: Recommended annual ophthalmology exams for early detection of glaucoma and other disorders of the eye.  Pt stated she does have annual eye exams by an Ophthalmologist in [place], [place] (unable to recall name at the moment).  Dental Screening: Recommended annual dental exams for proper oral hygiene  Community Resource Referral / Chronic Care Management: CRR required this visit?  No   CCM required this visit?  No     Plan:     I have personally reviewed and noted the following in the patient's chart:   Medical and social history Use of alcohol, tobacco or illicit drugs  Current medications and supplements including opioid prescriptions. Patient is not currently taking opioid prescriptions. Functional ability and status Nutritional status Physical activity Advanced directives List of other physicians Hospitalizations, surgeries, and ER visits in previous 12 months Vitals Screenings to include cognitive, depression, and falls Referrals and appointments  In addition, I have  reviewed and discussed with patient certain preventive protocols, quality metrics, and best practice recommendations. A written personalized care plan for preventive services as well as general preventive health recommendations were provided to patient.     Patria Bookbinder, CMA   08/08/2023   After Visit Summary: (MyChart) Due to this being a telephonic visit, the after visit summary with patients personalized plan was offered to patient via MyChart   Notes: Nothing significant to report at this time.

## 2023-09-24 ENCOUNTER — Encounter: Payer: Self-pay | Admitting: Family Medicine

## 2023-09-24 ENCOUNTER — Ambulatory Visit (INDEPENDENT_AMBULATORY_CARE_PROVIDER_SITE_OTHER): Admitting: Family Medicine

## 2023-09-24 ENCOUNTER — Ambulatory Visit: Payer: Self-pay | Admitting: Family Medicine

## 2023-09-24 VITALS — BP 102/66 | HR 94 | Temp 97.8°F | Ht 61.0 in | Wt 116.0 lb

## 2023-09-24 DIAGNOSIS — R195 Other fecal abnormalities: Secondary | ICD-10-CM

## 2023-09-24 DIAGNOSIS — R7303 Prediabetes: Secondary | ICD-10-CM

## 2023-09-24 DIAGNOSIS — I2510 Atherosclerotic heart disease of native coronary artery without angina pectoris: Secondary | ICD-10-CM

## 2023-09-24 DIAGNOSIS — E782 Mixed hyperlipidemia: Secondary | ICD-10-CM

## 2023-09-24 DIAGNOSIS — Z2821 Immunization not carried out because of patient refusal: Secondary | ICD-10-CM

## 2023-09-24 DIAGNOSIS — E559 Vitamin D deficiency, unspecified: Secondary | ICD-10-CM

## 2023-09-24 DIAGNOSIS — I10 Essential (primary) hypertension: Secondary | ICD-10-CM | POA: Diagnosis not present

## 2023-09-24 DIAGNOSIS — F419 Anxiety disorder, unspecified: Secondary | ICD-10-CM | POA: Diagnosis not present

## 2023-09-24 DIAGNOSIS — Z0000 Encounter for general adult medical examination without abnormal findings: Secondary | ICD-10-CM

## 2023-09-24 DIAGNOSIS — G479 Sleep disorder, unspecified: Secondary | ICD-10-CM

## 2023-09-24 DIAGNOSIS — J439 Emphysema, unspecified: Secondary | ICD-10-CM

## 2023-09-24 DIAGNOSIS — M8580 Other specified disorders of bone density and structure, unspecified site: Secondary | ICD-10-CM

## 2023-09-24 DIAGNOSIS — Z0001 Encounter for general adult medical examination with abnormal findings: Secondary | ICD-10-CM | POA: Insufficient documentation

## 2023-09-24 DIAGNOSIS — I700 Atherosclerosis of aorta: Secondary | ICD-10-CM | POA: Diagnosis not present

## 2023-09-24 DIAGNOSIS — K2100 Gastro-esophageal reflux disease with esophagitis, without bleeding: Secondary | ICD-10-CM

## 2023-09-24 DIAGNOSIS — E538 Deficiency of other specified B group vitamins: Secondary | ICD-10-CM | POA: Diagnosis not present

## 2023-09-24 DIAGNOSIS — Z1211 Encounter for screening for malignant neoplasm of colon: Secondary | ICD-10-CM

## 2023-09-24 LAB — COMPREHENSIVE METABOLIC PANEL WITH GFR
ALT: 12 U/L (ref 0–35)
AST: 17 U/L (ref 0–37)
Albumin: 4.7 g/dL (ref 3.5–5.2)
Alkaline Phosphatase: 86 U/L (ref 39–117)
BUN: 12 mg/dL (ref 6–23)
CO2: 32 meq/L (ref 19–32)
Calcium: 9.5 mg/dL (ref 8.4–10.5)
Chloride: 102 meq/L (ref 96–112)
Creatinine, Ser: 0.81 mg/dL (ref 0.40–1.20)
GFR: 70.26 mL/min (ref >60.00)
Glucose, Bld: 88 mg/dL (ref 70–99)
Potassium: 3.9 meq/L (ref 3.5–5.1)
Sodium: 140 meq/L (ref 135–145)
Total Bilirubin: 0.3 mg/dL (ref 0.2–1.2)
Total Protein: 7.2 g/dL (ref 6.0–8.3)

## 2023-09-24 LAB — LIPID PANEL
Cholesterol: 244 mg/dL — ABNORMAL HIGH (ref 0–200)
HDL: 51.8 mg/dL (ref >39.00)
LDL Cholesterol: 139 mg/dL — ABNORMAL HIGH (ref 0–99)
NonHDL: 191.99
Total CHOL/HDL Ratio: 5
Triglycerides: 267 mg/dL — ABNORMAL HIGH (ref 0.0–149.0)
VLDL: 53.4 mg/dL — ABNORMAL HIGH (ref 0.0–40.0)

## 2023-09-24 LAB — CBC WITH DIFFERENTIAL/PLATELET
Basophils Absolute: 0.1 10*3/uL (ref 0.0–0.1)
Basophils Relative: 0.9 % (ref 0.0–3.0)
Eosinophils Absolute: 0.1 10*3/uL (ref 0.0–0.7)
Eosinophils Relative: 1.1 % (ref 0.0–5.0)
HCT: 37.9 % (ref 36.0–46.0)
Hemoglobin: 13 g/dL (ref 12.0–15.0)
Lymphocytes Relative: 31.1 % (ref 12.0–46.0)
Lymphs Abs: 1.9 10*3/uL (ref 0.7–4.0)
MCHC: 34.4 g/dL (ref 30.0–36.0)
MCV: 91.5 fl (ref 78.0–100.0)
Monocytes Absolute: 0.4 10*3/uL (ref 0.1–1.0)
Monocytes Relative: 7.1 % (ref 3.0–12.0)
Neutro Abs: 3.7 10*3/uL (ref 1.4–7.7)
Neutrophils Relative %: 59.8 % (ref 43.0–77.0)
Platelets: 307 10*3/uL (ref 150.0–400.0)
RBC: 4.14 Mil/uL (ref 3.87–5.11)
RDW: 13.6 % (ref 11.5–15.5)
WBC: 6.2 10*3/uL (ref 4.0–10.5)

## 2023-09-24 LAB — T4, FREE: Free T4: 0.68 ng/dL (ref 0.60–1.60)

## 2023-09-24 LAB — TSH: TSH: 2.75 u[IU]/mL (ref 0.35–5.50)

## 2023-09-24 LAB — HEMOGLOBIN A1C: Hgb A1c MFr Bld: 5.8 % (ref 4.6–6.5)

## 2023-09-24 LAB — VITAMIN B12: Vitamin B-12: 317 pg/mL (ref 211–911)

## 2023-09-24 LAB — VITAMIN D 25 HYDROXY (VIT D DEFICIENCY, FRACTURES): VITD: 72.67 ng/mL (ref 30.00–100.00)

## 2023-09-24 MED ORDER — ALPRAZOLAM 0.5 MG PO TABS: 0.5000 mg | ORAL_TABLET | Freq: Every day | ORAL | 0 refills | Status: DC | PRN

## 2023-09-24 MED ORDER — AMITRIPTYLINE HCL 10 MG PO TABS: 10.0000 mg | ORAL_TABLET | Freq: Every evening | ORAL | 3 refills | Status: AC | PRN

## 2023-09-24 MED ORDER — ROSUVASTATIN CALCIUM 20 MG PO TABS: ORAL_TABLET | ORAL | 1 refills | Status: AC

## 2023-09-24 NOTE — Patient Instructions (Addendum)
 Please go to upstairs for labs before you leave today.  Continue your current medications.  I will be in touch with your results and with recommendations.  You should receive the Cologuard kit in the mail for colon cancer screening.

## 2023-09-24 NOTE — Assessment & Plan Note (Signed)
Controlled. Continue current medications and low sodium diet.

## 2023-09-24 NOTE — Assessment & Plan Note (Addendum)
Continue statin therapy and low fat diet

## 2023-09-24 NOTE — Assessment & Plan Note (Signed)
 Continue statin therapy  The 10-year ASCVD risk score (Arnett DK, et al., 2019) is: 11.9%   Values used to calculate the score:     Age: 78 years     Clincally relevant sex: Female     Is Non-Hispanic African American: No     Diabetic: No     Tobacco smoker: No     Systolic Blood Pressure: 102 mmHg     Is BP treated: No     HDL Cholesterol: 51.8 mg/dL     Total Cholesterol: 244 mg/dL

## 2023-09-24 NOTE — Progress Notes (Signed)
 Complete physical exam  Patient: Samantha [name]   DOB: 02/01/1946   78 y.o. Female  MRN: [id number]  Subjective:    Chief Complaint  Patient presents with   Annual Exam   She is here for a complete physical exam and to follow up on chronic health conditions.   Colonoscopy normal in the past. Declines another one. Willing to do Cologuard   DEXA - last one showed osteopenia and she does not want to recheck a DEXA  Mammogram- UTD  Takes vitamin D  supplement   Sees an eye doctor and dentist regularly.  Saw a dermatologist in the past  Uses sunscreen   GERD- takes Nexium  prn.   Severe emphysema- no issues with breathing   Aortic atherosclerosis and coronary artery calcification seen on CT chest  Reports taking statin daily.  Exercises - lifts weights in her home and stretching exercises. Walks   Lives alone. Has a fairly large family she spends time with   Denies depression. Anxiety is an issue.  Does not want to see a therapist.     Health Maintenance  Topic Date Due   COVID-19 Vaccine (1) Never done   Pneumococcal Vaccine for age over 25 (1 of 2 - PCV) 10/22/1965   DEXA scan (bone density measurement)  10/15/2023*   Flu Shot  11/07/2023   Mammogram  02/10/2024   Medicare Annual Wellness Visit  08/07/2024   Hepatitis C Screening  Completed   HPV Vaccine  Aged Out   Meningitis B Vaccine  Aged Out   DTaP/Tdap/Td vaccine  Discontinued   Colon Cancer Screening  Discontinued   Zoster (Shingles) Vaccine  Discontinued  *Topic was postponed. The date shown is not the original due date.    Wears seatbelt always, uses sunscreen, smoke detectors in home and functioning, does not text while driving, feels safe in home environment.  Depression screening:    08/08/2023    9:39 AM 07/09/2023   10:06 AM 02/18/2023   12:14 AM  Depression screen PHQ 2/9  Decreased Interest 0 1 0  Down, Depressed, Hopeless 1 1 0  PHQ - 2 Score 1 2 0  Altered sleeping 1 3   Tired,  decreased energy 0 1   Change in appetite 0 1   Feeling bad or failure about yourself  0 1   Trouble concentrating 0 1   Moving slowly or fidgety/restless 0 0   Suicidal thoughts 0 0   PHQ-9 Score 2 9   Difficult doing work/chores Not difficult at all Not difficult at all    Anxiety Screening:    07/09/2023   10:07 AM  GAD 7 : Generalized Anxiety Score  Nervous, Anxious, on Edge 1  Control/stop worrying 1  Worry too much - different things 1  Trouble relaxing 1  Restless 1  Easily annoyed or irritable 1  Afraid - awful might happen 1  Total GAD 7 Score 7  Anxiety Difficulty Not difficult at all    Vision:Within last year and Dental: No current dental problems and Receives regular dental care  Patient Active Problem List   Diagnosis Date Noted   Sleep disorder 09/24/2023   Anxiety 09/24/2023   Prediabetes 09/24/2023   Encounter for general adult medical examination with abnormal findings 09/24/2023   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   History of breast cancer 01/26/2021   Coronary artery calcification seen on CT scan 07/21/2020   BMI 22.0-22.9, adult 06/23/2020   B12 deficiency 06/23/2020   Aortic  atherosclerosis (HCC) 09/23/2017   Emphysema of lung (HCC) 09/23/2017   Osteopenia 09/08/2014   Abnormal glucose 05/04/2014   Vitamin D  deficiency 05/04/2014   Medication management 05/04/2014   Esophageal reflux 01/31/2011   Hyperlipidemia 08/15/2009   Major depression in remission (HCC) 08/15/2009   Essential hypertension 08/15/2009   Insomnia 08/15/2009   Past Medical History:  Diagnosis Date   Anxiety    Breast cancer (HCC)    left    Depressive disorder, not elsewhere classified    Diverticulosis of colon (without mention of hemorrhage) 2007   Colonoscopy   Emphysema of lung (HCC)    Esophagitis, unspecified 2001   EGD   Female genital prolapse 01/26/2019   GERD (gastroesophageal reflux disease)    Hx of colonic polyps 2003   Colonoscopy    IBS  (irritable bowel syndrome)    Insomnia, unspecified    Internal hemorrhoids without mention of complication 2007   Colonoscopy    Personal history of radiation therapy 04/08/2004   Pure hypercholesterolemia    Sleep apnea    Stricture and stenosis of esophagus 2001, 2007   EGD   Unspecified essential hypertension    Past Surgical History:  Procedure Laterality Date   ABDOMINAL HYSTERECTOMY     with 1 ovary spared ? patient unsure    BREAST LUMPECTOMY  2005   left   CHOLECYSTECTOMY  2004   EYE SURGERY  [id number]   NASAL SINUS SURGERY     TUBAL LIGATION     Social History   Tobacco Use   Smoking status: Former    Current packs/day: 0.00    Average packs/day: 0.4 packs/day for 35.0 years (14.0 ttl pk-yrs)    Types: Cigarettes    Start date: 03/28/1977    Quit date: 03/28/2012    Years since quitting: 11.4    Passive exposure: Past   Smokeless tobacco: Never  Vaping Use   Vaping status: Never Used  Substance Use Topics   Alcohol use: Yes    Alcohol/week: 5.0 standard drinks of alcohol    Types: 1 Glasses of wine, 4 Cans of beer per week    Comment: 1 drink a week   Drug use: No      Patient Care Team: Abram Abraham, NP-C as PCP - General (Family Medicine) Claudette Cue, MD (Inactive) as Consulting Physician (Gastroenterology) Wolm Hawthorne, MD as Consulting Physician (Dermatology) Magrinat, Rozella Cornfield, MD (Inactive) as Consulting Physician (Oncology)   Outpatient Medications Prior to Visit  Medication Sig   aspirin EC 81 MG tablet Take 81 mg by mouth daily. Swallow whole.   PRN   buPROPion  (WELLBUTRIN  XL) 150 MG 24 hr tablet TAKE 1 TABLET BY MOUTH EVERY DAY IN THE MORNING   cholecalciferol (VITAMIN D ) 1000 units tablet Take 1,000 Units by mouth.   escitalopram  (LEXAPRO ) 20 MG tablet TAKE 1 TABLET DAILY FOR MOOD (DX: F32.5)   esomeprazole  (NEXIUM ) 40 MG capsule Take  1 capsule  Daily  to Prevent Heartburn & Indigestion                                            /  TAKE                                         BY                                                 MOUTH   Magnesium  250 MG TABS Take 250 mg by mouth daily.   [DISCONTINUED] ALPRAZolam  (XANAX ) 0.5 MG tablet Take 1 tablet (0.5 mg total) by mouth daily as needed for anxiety (for severe anxiety).   [DISCONTINUED] amitriptyline  (ELAVIL ) 10 MG tablet Take 1-2 tablets (10-20 mg total) by mouth at bedtime as needed. for sleep   [DISCONTINUED] rosuvastatin  (CRESTOR ) 20 MG tablet Take  1 tablet  Daily for Cholesterol                                 /                                                                   TAKE                                         BY                                                 MOUTH   No facility-administered medications prior to visit.    Review of Systems  Constitutional:  Negative for chills, fever, malaise/fatigue and weight loss.  HENT:  Negative for congestion, ear pain, sinus pain and sore throat.   Eyes:  Negative for blurred vision, double vision and pain.  Respiratory:  Negative for cough, shortness of breath and wheezing.   Cardiovascular:  Negative for chest pain, palpitations and leg swelling.  Gastrointestinal:  Negative for abdominal pain, constipation, diarrhea, nausea and vomiting.  Genitourinary:  Negative for dysuria, frequency and urgency.  Musculoskeletal:  Negative for back pain, falls, joint pain and myalgias.  Skin:  Negative for rash.  Neurological:  Negative for dizziness, tingling, focal weakness and headaches.  Psychiatric/Behavioral:  Negative for depression, memory loss and substance abuse. The patient is not nervous/anxious.        Objective:    BP 102/66   Pulse 94   Temp 97.8 F (36.6 C) (Temporal)   Ht 5' 1 (1.549 m)   Wt 116 lb (52.6 kg)   SpO2 98%   BMI 21.92 kg/m  BP Readings from Last 3 Encounters:  09/24/23 102/66  07/09/23 112/66  02/18/23 110/60   Wt  Readings from Last 3 Encounters:  09/24/23 116 lb (52.6 kg)  08/08/23 116 lb (52.6 kg)  07/09/23 116 lb (52.6 kg)    Physical Exam Constitutional:      General: She is not  in acute distress.    Appearance: She is not ill-appearing.  HENT:     Right Ear: Tympanic membrane, ear canal and external ear normal.     Left Ear: Tympanic membrane, ear canal and external ear normal.     Nose: Nose normal.     Mouth/Throat:     Mouth: Mucous membranes are moist.     Pharynx: Oropharynx is clear.   Eyes:     Extraocular Movements: Extraocular movements intact.     Conjunctiva/sclera: Conjunctivae normal.     Pupils: Pupils are equal, round, and reactive to light.   Neck:     Thyroid : No thyroid  mass, thyromegaly or thyroid  tenderness.     Vascular: No carotid bruit or JVD.   Cardiovascular:     Rate and Rhythm: Normal rate and regular rhythm.     Pulses: Normal pulses.     Heart sounds: Normal heart sounds.  Pulmonary:     Effort: Pulmonary effort is normal.     Breath sounds: Normal breath sounds.  Abdominal:     General: Bowel sounds are normal. There is no abdominal bruit.     Palpations: Abdomen is soft.     Tenderness: There is no abdominal tenderness. There is no right CVA tenderness, left CVA tenderness, guarding or rebound. Negative signs include Murphy's sign and McBurney's sign.   Musculoskeletal:        General: Normal range of motion.     Cervical back: Normal range of motion and neck supple. No tenderness.     Right lower leg: No edema.     Left lower leg: No edema.  Lymphadenopathy:     Cervical: No cervical adenopathy.   Skin:    General: Skin is warm and dry.     Findings: No lesion or rash.   Neurological:     General: No focal deficit present.     Mental Status: She is alert and oriented to person, place, and time.     Cranial Nerves: No cranial nerve deficit.     Sensory: No sensory deficit.     Motor: No weakness.     Gait: Gait normal.    Psychiatric:        Mood and Affect: Mood normal.        Behavior: Behavior normal.        Thought Content: Thought content normal.      Results for orders placed or performed in visit on 09/24/23  T4, free  Result Value Ref Range   Free T4 0.68 0.60 - 1.60 ng/dL  TSH  Result Value Ref Range   TSH 2.75 0.35 - 5.50 uIU/mL  VITAMIN D  25 Hydroxy (Vit-D Deficiency, Fractures)  Result Value Ref Range   VITD 72.67 30.00 - 100.00 ng/mL  Vitamin B12  Result Value Ref Range   Vitamin B-12 317 211 - 911 pg/mL  Lipid panel  Result Value Ref Range   Cholesterol 244 (H) 0 - 200 mg/dL   Triglycerides 657.8 (H) 0.0 - 149.0 mg/dL   HDL 46.96 >29.52 mg/dL   VLDL 84.1 (H) 0.0 - 32.4 mg/dL   LDL Cholesterol 401 (H) 0 - 99 mg/dL   Total CHOL/HDL Ratio 5    NonHDL 191.99   Hemoglobin A1c  Result Value Ref Range   Hgb A1c MFr Bld 5.8 4.6 - 6.5 %  Comprehensive metabolic panel with GFR  Result Value Ref Range   Sodium 140 135 - 145 mEq/L   Potassium 3.9 3.5 -  5.1 mEq/L   Chloride 102 96 - 112 mEq/L   CO2 32 19 - 32 mEq/L   Glucose, Bld 88 70 - 99 mg/dL   BUN 12 6 - 23 mg/dL   Creatinine, Ser 8.11 0.40 - 1.20 mg/dL   Total Bilirubin 0.3 0.2 - 1.2 mg/dL   Alkaline Phosphatase 86 39 - 117 U/L   AST 17 0 - 37 U/L   ALT 12 0 - 35 U/L   Total Protein 7.2 6.0 - 8.3 g/dL   Albumin 4.7 3.5 - 5.2 g/dL   GFR 91.47 >82.95 mL/min   Calcium  9.5 8.4 - 10.5 mg/dL  CBC with Differential/Platelet  Result Value Ref Range   WBC 6.2 4.0 - 10.5 K/uL   RBC 4.14 3.87 - 5.11 Mil/uL   Hemoglobin 13.0 12.0 - 15.0 g/dL   HCT 62.1 30.8 - 65.7 %   MCV 91.5 78.0 - 100.0 fl   MCHC 34.4 30.0 - 36.0 g/dL   RDW 84.6 96.2 - 95.2 %   Platelets 307.0 150.0 - 400.0 K/uL   Neutrophils Relative % 59.8 43.0 - 77.0 %   Lymphocytes Relative 31.1 12.0 - 46.0 %   Monocytes Relative 7.1 3.0 - 12.0 %   Eosinophils Relative 1.1 0.0 - 5.0 %   Basophils Relative 0.9 0.0 - 3.0 %   Neutro Abs 3.7 1.4 - 7.7 K/uL   Lymphs  Abs 1.9 0.7 - 4.0 K/uL   Monocytes Absolute 0.4 0.1 - 1.0 K/uL   Eosinophils Absolute 0.1 0.0 - 0.7 K/uL   Basophils Absolute 0.1 0.0 - 0.1 K/uL      Assessment & Plan:    Routine Health Maintenance and Physical Exam  Problem List Items Addressed This Visit     Anxiety   Declines counseling. Takes alprazolam  sparingly.       Relevant Medications   ALPRAZolam  (XANAX ) 0.5 MG tablet   amitriptyline  (ELAVIL ) 10 MG tablet   Aortic atherosclerosis (HCC)   Continue statin therapy and low fat diet       Relevant Medications   rosuvastatin  (CRESTOR ) 20 MG tablet   Other Relevant Orders   Lipid panel (Completed)   B12 deficiency   Check vitamin B12 and follow up      Relevant Orders   Vitamin B12 (Completed)   Coronary artery calcification seen on CT scan   Discussed that I cannot tell her how severe her calcification is and that she should continue statin therapy. Asymptomatic. Active without pain or DOE.       Relevant Medications   rosuvastatin  (CRESTOR ) 20 MG tablet   Other Relevant Orders   Lipid panel (Completed)   Emphysema of lung (HCC)   Severe per CT chest. Former smoker. Asymptomatic. No inhalers.       Encounter for general adult medical examination with abnormal findings - Primary   Preventive health care reviewed. Cologuard ordered. Mammogram scheduled. Declines DEXA.  Counseling on healthy lifestyle including diet and exercise.  Recommend regular dental and eye exams.  Immunizations reviewed.  Declines pneumonia vaccine. Discussed safety.       Esophageal reflux   Nexium  prn. Avoid triggers.       Essential hypertension   Controlled. Continue current medications and low sodium diet.       Relevant Medications   rosuvastatin  (CRESTOR ) 20 MG tablet   Other Relevant Orders   CBC with Differential/Platelet (Completed)   Comprehensive metabolic panel with GFR (Completed)   TSH (Completed)   T4, free (  Completed)   Hyperlipidemia   Continue statin  therapy  The 10-year ASCVD risk score (Arnett DK, et al., 2019) is: 11.9%   Values used to calculate the score:     Age: 34 years     Clincally relevant sex: Female     Is Non-Hispanic African American: No     Diabetic: No     Tobacco smoker: No     Systolic Blood Pressure: 102 mmHg     Is BP treated: No     HDL Cholesterol: 51.8 mg/dL     Total Cholesterol: 244 mg/dL       Relevant Medications   rosuvastatin  (CRESTOR ) 20 MG tablet   Other Relevant Orders   Lipid panel (Completed)   Osteopenia   Declines repeat DEXA. Continue getting adequate calcium  and vitamin D . Continue weight bearing exercises.       Prediabetes   Check A1c and follow up. Work on diet low in sugar and carbohydrates.       Relevant Orders   CBC with Differential/Platelet (Completed)   Comprehensive metabolic panel with GFR (Completed)   Hemoglobin A1c (Completed)   TSH (Completed)   T4, free (Completed)   Sleep disorder   Takes amitriptyline  nightly and it is effective. Prescribed by previous PCP. Continue medication.       Relevant Medications   amitriptyline  (ELAVIL ) 10 MG tablet   Vitamin D  deficiency   Check vitamin D  level and follow up      Relevant Orders   VITAMIN D  25 Hydroxy (Vit-D Deficiency, Fractures) (Completed)   Other Visit Diagnoses       Screen for colon cancer       Relevant Orders   Cologuard     Immunization declined           Return in about 6 months (around 03/25/2024) for chronic health conditions.     Alyson Back, NP-C

## 2023-09-24 NOTE — Assessment & Plan Note (Signed)
 Declines counseling. Takes alprazolam  sparingly.

## 2023-09-24 NOTE — Assessment & Plan Note (Signed)
 Takes amitriptyline  nightly and it is effective. Prescribed by previous PCP. Continue medication.

## 2023-09-24 NOTE — Assessment & Plan Note (Signed)
 Check A1c and follow up. Work on diet low in sugar and carbohydrates.

## 2023-09-24 NOTE — Assessment & Plan Note (Signed)
Check vitamin D level and follow up 

## 2023-09-24 NOTE — Assessment & Plan Note (Signed)
 Preventive health care reviewed. Cologuard ordered. Mammogram scheduled. Declines DEXA.  Counseling on healthy lifestyle including diet and exercise.  Recommend regular dental and eye exams.  Immunizations reviewed.  Declines pneumonia vaccine. Discussed safety.

## 2023-09-24 NOTE — Assessment & Plan Note (Signed)
 Declines repeat DEXA. Continue getting adequate calcium  and vitamin D . Continue weight bearing exercises.

## 2023-09-24 NOTE — Assessment & Plan Note (Signed)
 Severe per CT chest. Former smoker. Asymptomatic. No inhalers.

## 2023-09-24 NOTE — Assessment & Plan Note (Signed)
 Check vitamin B12 and follow up

## 2023-09-24 NOTE — Assessment & Plan Note (Signed)
 Discussed that I cannot tell her how severe her calcification is and that she should continue statin therapy. Asymptomatic. Active without pain or DOE.

## 2023-09-24 NOTE — Assessment & Plan Note (Addendum)
 Nexium  prn. Avoid triggers.

## 2023-09-30 NOTE — Progress Notes (Signed)
 Please ask her if she has been taking her rosuvastatin  everyday over the past 6-8 weeks or if she has missed some doses. If she has not missed any doses, we should increase her dose of rosuvastatin  to get her cholesterol in goal range. Her LDL is 139 and goal range is <100.

## 2023-10-02 LAB — COLOGUARD: COLOGUARD: POSITIVE — AB

## 2023-10-02 NOTE — Progress Notes (Signed)
 Please see my note to her regarding her positive Cologuard test and my referral to [facility].  Let me know if she has any questions please

## 2023-10-15 ENCOUNTER — Ambulatory Visit: Payer: Medicare Other | Admitting: Nurse Practitioner

## 2023-11-03 ENCOUNTER — Ambulatory Visit: Payer: Medicare Other | Admitting: Nurse Practitioner

## 2023-11-10 ENCOUNTER — Other Ambulatory Visit: Payer: Self-pay | Admitting: Nurse Practitioner

## 2023-11-10 DIAGNOSIS — F325 Major depressive disorder, single episode, in full remission: Secondary | ICD-10-CM

## 2023-11-13 ENCOUNTER — Other Ambulatory Visit: Payer: Self-pay | Admitting: Nurse Practitioner

## 2023-11-13 DIAGNOSIS — F324 Major depressive disorder, single episode, in partial remission: Secondary | ICD-10-CM

## 2023-11-17 ENCOUNTER — Telehealth: Payer: Self-pay

## 2023-11-17 DIAGNOSIS — F325 Major depressive disorder, single episode, in full remission: Secondary | ICD-10-CM

## 2023-11-17 MED ORDER — ESCITALOPRAM OXALATE 20 MG PO TABS: ORAL_TABLET | ORAL | 0 refills | Status: DC

## 2023-11-17 NOTE — Telephone Encounter (Signed)
 Rx sent.

## 2023-11-17 NOTE — Telephone Encounter (Signed)
 Copied from CRM #[id number]. Topic: Clinical - Prescription Issue >> Nov 17, 2023  8:43 AM Merlynn LABOR wrote: Reason for CRM: Patient no longer sees Dr. Jude due to officer closure. Patient needs medication refilled escitalopram  (LEXAPRO ) 20 MG tablet and has been waiting 1 week for refill. Please refill for patient if possible. Any additional questions please contact patient at [phone].

## 2023-11-18 ENCOUNTER — Other Ambulatory Visit: Payer: Self-pay | Admitting: Family Medicine

## 2023-11-18 NOTE — Telephone Encounter (Signed)
 LOV: 09/24/23 Last fill: 09/24/23, 20 tablet 0 refill

## 2023-11-18 NOTE — Telephone Encounter (Signed)
 LM for pt with [facility] number asking her to please reach out and schedule an appt as they have been trying to reach her to schedule

## 2023-12-24 ENCOUNTER — Other Ambulatory Visit: Payer: Self-pay | Admitting: Family Medicine

## 2023-12-24 DIAGNOSIS — F325 Major depressive disorder, single episode, in full remission: Secondary | ICD-10-CM

## 2023-12-25 NOTE — Telephone Encounter (Signed)
 LOV: 09/24/23 Last fill: 11/18/23, 20 tablet 0 refill

## 2023-12-25 NOTE — Telephone Encounter (Signed)
 Called pt and she reports she has been taking medication more frequently, has been having a lot going on in the family and is having more anxiety. She reports taking 1 about every other day.  Pt also reports she has not scheduled w GI yet, I provided her w their number to call and schedule. She states she will work on that

## 2024-01-27 ENCOUNTER — Other Ambulatory Visit: Payer: Self-pay | Admitting: Family Medicine

## 2024-01-27 DIAGNOSIS — F324 Major depressive disorder, single episode, in partial remission: Secondary | ICD-10-CM

## 2024-01-27 NOTE — Telephone Encounter (Unsigned)
 Copied from CRM #[id number]. Topic: Clinical - Medication Refill >> Jan 27, 2024 11:25 AM Graeme ORN wrote: Medication: buPROPion  (WELLBUTRIN  XL) 150 MG 24 hr tablet Takes with Lexapro  - two work well together for her  Has the patient contacted their pharmacy? Yes (Agent: If no, request that the patient contact the pharmacy for the refill. If patient does not wish to contact the pharmacy document the reason why and proceed with request.) (Agent: If yes, when and what did the pharmacy advise?) Sent request has not received response.   This is the patient's preferred pharmacy:  [id number] [address], [place] - [address] [place] [address] Phone: [phone] Fax: [phone]  Is this the correct pharmacy for this prescription? Yes If no, delete pharmacy and type the correct one.   Has the prescription been filled recently? No  Is the patient out of the medication? Yes  Has the patient been seen for an appointment in the last year OR does the patient have an upcoming appointment? Yes  Can we respond through MyChart? Yes  Agent: Please be advised that Rx refills may take up to 3 business days. We ask that you follow-up with your pharmacy.

## 2024-01-28 MED ORDER — BUPROPION HCL ER (XL) 150 MG PO TB24
ORAL_TABLET | ORAL | 1 refills | Status: AC
Start: 2024-01-28 — End: ?

## 2024-02-09 ENCOUNTER — Other Ambulatory Visit: Payer: Self-pay

## 2024-02-09 DIAGNOSIS — K2100 Gastro-esophageal reflux disease with esophagitis, without bleeding: Secondary | ICD-10-CM

## 2024-02-09 MED ORDER — ESOMEPRAZOLE MAGNESIUM 40 MG PO CPDR: DELAYED_RELEASE_CAPSULE | ORAL | 1 refills | Status: AC

## 2024-02-11 ENCOUNTER — Ambulatory Visit
Admission: RE | Admit: 2024-02-11 | Discharge: 2024-02-11 | Disposition: A | Discharge status: Home / Self Care | Source: Ambulatory Visit | Attending: Family Medicine | Admitting: Family Medicine

## 2024-02-11 DIAGNOSIS — Z1231 Encounter for screening mammogram for malignant neoplasm of breast: Secondary | ICD-10-CM

## 2024-05-09 ENCOUNTER — Other Ambulatory Visit: Payer: Self-pay | Admitting: Family Medicine

## 2024-05-09 DIAGNOSIS — F325 Major depressive disorder, single episode, in full remission: Secondary | ICD-10-CM

## 2024-09-24 ENCOUNTER — Encounter: Admitting: Family Medicine

## 2024-09-24 ENCOUNTER — Ambulatory Visit
